# Patient Record
Sex: Male | Born: 1955
Health system: Southern US, Community
[De-identification: ages and names within clinical notes are randomized; demographics above are authoritative.]

## PROBLEM LIST (undated history)

## (undated) DIAGNOSIS — R911 Solitary pulmonary nodule: Secondary | ICD-10-CM

## (undated) DIAGNOSIS — N201 Calculus of ureter: Secondary | ICD-10-CM

## (undated) DIAGNOSIS — K219 Gastro-esophageal reflux disease without esophagitis: Secondary | ICD-10-CM

## (undated) DIAGNOSIS — G8929 Other chronic pain: Secondary | ICD-10-CM

## (undated) DIAGNOSIS — R109 Unspecified abdominal pain: Secondary | ICD-10-CM

## (undated) DIAGNOSIS — R011 Cardiac murmur, unspecified: Secondary | ICD-10-CM

## (undated) DIAGNOSIS — Z87442 Personal history of urinary calculi: Secondary | ICD-10-CM

## (undated) DIAGNOSIS — I10 Essential (primary) hypertension: Secondary | ICD-10-CM

## (undated) HISTORY — DX: Unspecified abdominal pain: R10.9

## (undated) HISTORY — DX: Other chronic pain: G89.29

## (undated) HISTORY — DX: Solitary pulmonary nodule: R91.1

## (undated) HISTORY — DX: Essential (primary) hypertension: I10

---

## 1998-12-20 HISTORY — PX: OTHER SURGICAL HISTORY: SHX169

## 2001-10-13 ENCOUNTER — Encounter: Payer: Self-pay | Admitting: *Deleted

## 2001-10-13 ENCOUNTER — Emergency Department (HOSPITAL_COMMUNITY): Admission: EM | Admit: 2001-10-13 | Discharge: 2001-10-13 | Payer: Self-pay | Admitting: *Deleted

## 2010-09-23 ENCOUNTER — Encounter (HOSPITAL_COMMUNITY)
Admission: RE | Admit: 2010-09-23 | Discharge: 2010-12-18 | Payer: Self-pay | Source: Home / Self Care | Attending: Urology | Admitting: Urology

## 2010-11-26 ENCOUNTER — Emergency Department (HOSPITAL_COMMUNITY): Admission: EM | Admit: 2010-11-26 | Discharge: 2010-06-22 | Payer: Self-pay | Admitting: Emergency Medicine

## 2011-03-07 LAB — DIFFERENTIAL
Blasts: 0 %
Metamyelocytes Relative: 0 %
Myelocytes: 0 %
Neutro Abs: 6.3 10*3/uL (ref 1.7–7.7)
Neutrophils Relative %: 68 % (ref 43–77)
Promyelocytes Absolute: 0 %
nRBC: 0 /100 WBC

## 2011-03-07 LAB — CBC
HCT: 37.2 % — ABNORMAL LOW (ref 39.0–52.0)
Hemoglobin: 12.5 g/dL — ABNORMAL LOW (ref 13.0–17.0)
MCH: 32.2 pg (ref 26.0–34.0)
MCHC: 33.7 g/dL (ref 30.0–36.0)
MCV: 95.6 fL (ref 78.0–100.0)
Platelets: 230 10*3/uL (ref 150–400)
RBC: 3.89 MIL/uL — ABNORMAL LOW (ref 4.22–5.81)
RDW: 14 % (ref 11.5–15.5)
WBC: 9.2 10*3/uL (ref 4.0–10.5)

## 2011-03-07 LAB — COMPREHENSIVE METABOLIC PANEL
ALT: 16 U/L (ref 0–53)
AST: 28 U/L (ref 0–37)
Albumin: 4.1 g/dL (ref 3.5–5.2)
Alkaline Phosphatase: 91 U/L (ref 39–117)
BUN: 18 mg/dL (ref 6–23)
CO2: 25 mEq/L (ref 19–32)
Calcium: 9.1 mg/dL (ref 8.4–10.5)
Chloride: 103 mEq/L (ref 96–112)
Creatinine, Ser: 1.53 mg/dL — ABNORMAL HIGH (ref 0.4–1.5)
GFR calc Af Amer: 58 mL/min — ABNORMAL LOW (ref >60)
GFR calc non Af Amer: 48 mL/min — ABNORMAL LOW (ref >60)
Glucose, Bld: 128 mg/dL — ABNORMAL HIGH (ref 70–99)
Potassium: 4.3 mEq/L (ref 3.5–5.1)
Sodium: 134 mEq/L — ABNORMAL LOW (ref 135–145)
Total Bilirubin: 1 mg/dL (ref 0.3–1.2)
Total Protein: 7.8 g/dL (ref 6.0–8.3)

## 2011-03-07 LAB — URINALYSIS, ROUTINE W REFLEX MICROSCOPIC
Bilirubin Urine: NEGATIVE
Glucose, UA: NEGATIVE mg/dL
Ketones, ur: NEGATIVE mg/dL
Leukocytes, UA: NEGATIVE
Nitrite: NEGATIVE
Protein, ur: NEGATIVE mg/dL
Specific Gravity, Urine: 1.025 (ref 1.005–1.030)
Urobilinogen, UA: 0.2 mg/dL (ref 0.0–1.0)
pH: 6 (ref 5.0–8.0)

## 2011-03-07 LAB — URINE CULTURE: Colony Count: 25000

## 2011-03-07 LAB — URINE MICROSCOPIC-ADD ON

## 2011-09-02 ENCOUNTER — Encounter: Payer: Self-pay | Admitting: Family Medicine

## 2011-09-02 ENCOUNTER — Ambulatory Visit (INDEPENDENT_AMBULATORY_CARE_PROVIDER_SITE_OTHER): Payer: 59 | Admitting: Family Medicine

## 2011-09-02 VITALS — BP 182/100 | HR 52 | Resp 16 | Ht 69.5 in | Wt 142.4 lb

## 2011-09-02 DIAGNOSIS — N200 Calculus of kidney: Secondary | ICD-10-CM

## 2011-09-02 DIAGNOSIS — R109 Unspecified abdominal pain: Secondary | ICD-10-CM

## 2011-09-02 DIAGNOSIS — I10 Essential (primary) hypertension: Secondary | ICD-10-CM

## 2011-09-02 DIAGNOSIS — G8929 Other chronic pain: Secondary | ICD-10-CM

## 2011-09-02 MED ORDER — AMLODIPINE BESYLATE 10 MG PO TABS: 10.0000 mg | ORAL_TABLET | Freq: Every day | ORAL | Status: DC

## 2011-09-02 NOTE — Patient Instructions (Addendum)
Please get your blood work  do not eat after midnight. Start the blood pressure medication Next visit on Monday to  f/u blood pressure

## 2011-09-02 NOTE — Assessment & Plan Note (Signed)
He'll continue to follow with Dr.Nessi for his kidney stones. Of note he thinks he is on a medication to control kidney stones however he does not have the name of this medication with him.

## 2011-09-02 NOTE — Assessment & Plan Note (Addendum)
Patient currently asymptomatic from elevated blood pressure. I will start him on Norvasc. I did discuss with the cardiologist regarding the absorption problem. He states we can still try pills first. If this does not work then move to clonidine patch. Baseline labs to be drawn

## 2011-09-02 NOTE — Assessment & Plan Note (Signed)
He is currently receiving that no patches from her surgeon although he does not follow with him on a yearly basis. I will obtain records regarding this.

## 2011-09-02 NOTE — Progress Notes (Signed)
  Subjective:    Patient ID: Johnathan Baldwin, male    DOB: September 11, 1956, 55 y.o.   MRN: [id number]  HPI  Pt here to establish care. No previous PCP   HTN- was at his urologist office and told his blood pressure was high,he had an appt last week and it was still very elevated, he has never been on any blood pressure medications.   Exercises on regular basis. Non smoker   Kidney Stones-- has recurrent kidney stones , recently had a UTI, he has completed antibiotics, he has also had prostate checked by urology    Colostomy- has colsotomy s/p abdominal injury at work on a machine, resulted in surgery and colostomy. He does not follow with any specialist for this at this time. On disability secondary to this.    Urologist- Dr. Julien Girt ( [facility] in [place]) Surgeon- Dr. Verl Bangs [facility] [place])- general surgeon, continues to prescribe the pain patch(Fentanyl q 3 days))   No recent blood work.   Review of Systems   GEN- denies fatigue, fever, weight loss,weakness, recent illness CVS- denies chest pain, palpitations RESP- denies SOB, cough, wheeze MSK- denies joint pain, muscle aches, injury Neuro- occ headache, denies dizziness, syncope, seizure activity      Objective:   Physical Exam GEN- NAD, alert and oriented x3, thin, pleasant HEENT- PERRL, EOMI,  MMM, oropharynx clear CVS- bradycardia (HR 50's), no murmur RESP-CTAB ABD- Soft, multiple scars, NT, ND, colostomy RLQ - pink stoma EXT- No edema Pulses- Radial, DP- 2+   EKG- Bradycardia-sinus, LVH     Assessment & Plan:

## 2011-09-06 ENCOUNTER — Ambulatory Visit (INDEPENDENT_AMBULATORY_CARE_PROVIDER_SITE_OTHER): Payer: 59 | Admitting: Family Medicine

## 2011-09-06 ENCOUNTER — Encounter: Payer: Self-pay | Admitting: Family Medicine

## 2011-09-06 VITALS — BP 124/84 | HR 59 | Resp 16 | Ht 69.5 in | Wt 142.0 lb

## 2011-09-06 DIAGNOSIS — I10 Essential (primary) hypertension: Secondary | ICD-10-CM

## 2011-09-06 MED ORDER — AMLODIPINE BESYLATE 10 MG PO TABS: 10.0000 mg | ORAL_TABLET | Freq: Every day | ORAL | Status: DC

## 2011-09-06 NOTE — Patient Instructions (Signed)
Continue your current blood pressure medication. Call if have any problems  We will call with lab results  Next visit in Feb 2013

## 2011-09-06 NOTE — Assessment & Plan Note (Addendum)
Patient blood pressure much improved with addition of Norvasc. His goal is less than 140/90. Will continue current meds. He will have basic lab work done and we will call with results. He does not appear to be having any absorption problems at this time.

## 2011-09-06 NOTE — Progress Notes (Signed)
  Subjective:    Patient ID: Johnathan Baldwin, male    DOB: 1956/05/06, 55 y.o.   MRN: [id number]  HPI Patient here to followup on his blood pressure. One week ago was started on Norvasc secondary to elevated blood pressure readings at urology office as well as our office. He denies any absorption problems with his pill. He has not seen the pill in his colostomy bag. He started taking the medication on Saturday.   ROS- Denies HA, CP, SOB, Leg swelling He has not had labs done yet  Review of Systems per above      Objective:   Physical Exam GEN- NAD, alert and oriented,pleasant CVS-Bradycardic, no murmur RESP-CTAB EXT- no edema       Assessment & Plan:

## 2011-09-10 ENCOUNTER — Encounter: Payer: Self-pay | Admitting: Family Medicine

## 2012-02-07 ENCOUNTER — Encounter: Payer: Self-pay | Admitting: Family Medicine

## 2012-02-07 ENCOUNTER — Ambulatory Visit (INDEPENDENT_AMBULATORY_CARE_PROVIDER_SITE_OTHER): Payer: 59 | Admitting: Family Medicine

## 2012-02-07 VITALS — BP 154/84 | HR 55 | Resp 16 | Ht 69.5 in | Wt 145.1 lb

## 2012-02-07 DIAGNOSIS — M222X9 Patellofemoral disorders, unspecified knee: Secondary | ICD-10-CM | POA: Insufficient documentation

## 2012-02-07 DIAGNOSIS — M778 Other enthesopathies, not elsewhere classified: Secondary | ICD-10-CM | POA: Insufficient documentation

## 2012-02-07 DIAGNOSIS — I10 Essential (primary) hypertension: Secondary | ICD-10-CM

## 2012-02-07 DIAGNOSIS — Z23 Encounter for immunization: Secondary | ICD-10-CM

## 2012-02-07 DIAGNOSIS — M65839 Other synovitis and tenosynovitis, unspecified forearm: Secondary | ICD-10-CM

## 2012-02-07 DIAGNOSIS — M25569 Pain in unspecified knee: Secondary | ICD-10-CM

## 2012-02-07 DIAGNOSIS — M65849 Other synovitis and tenosynovitis, unspecified hand: Secondary | ICD-10-CM

## 2012-02-07 MED ORDER — MELOXICAM 7.5 MG PO TABS: 7.5000 mg | ORAL_TABLET | Freq: Every day | ORAL | Status: DC

## 2012-02-07 MED ORDER — AMLODIPINE BESYLATE 10 MG PO TABS: 10.0000 mg | ORAL_TABLET | Freq: Every day | ORAL | Status: DC

## 2012-02-07 NOTE — Assessment & Plan Note (Signed)
Restart Norvasc. Patient will get labs done

## 2012-02-07 NOTE — Assessment & Plan Note (Signed)
I believe he has a tendinitis of his forearm from overuse with the weights. He will cut back on the amount of weight he is lifting. His biceps appears intact. And his strength is good in his forearm and arm. Anti-inflammatories prescribed per above

## 2012-02-07 NOTE — Assessment & Plan Note (Signed)
Anterior knee pain more consistent with patellofemoral syndrome. We'll start on anti-inflammatories. Exam concerning today. If no improvement will refer to ortho

## 2012-02-07 NOTE — Patient Instructions (Addendum)
For your knee pain- I think this Patellafemoral syndrome- use ice as needed after work-outs, Meloxicam is an anti-inflammatory, try this for 4 weeks, if not better please call and I will refer you to orthopedics For your arm- Meloxicam is the anti-inflammatory decrease your weights in the meantime Restart your blood pressure pill Today you have been given your TDAP (tetanus) Please get your lab work we will call with resulst F/U in 3 months for blood pressure

## 2012-02-07 NOTE — Progress Notes (Signed)
  Subjective:    Patient ID: Johnathan Baldwin, male    DOB: 01-22-1956, 56 y.o.   MRN: [id number]  HPI Hypertension-patient has been out of blood pressure medications for the past 5 days. He was on Norvasc 10 mg previously. He was tolerating without any problems  Knee pain-right knee pain for the past 3-4 months. He does not have any particular injury but works out a lot. He denies locking, catching, getting out of knee. He denies swelling of knee. Pain is located in the anterior knee it is more of an aching quality. He has not tried any over-the-counter medications.  Arm pain-patient noted pain in his left forearm after lifting weights. He typically lives 80 pounds with his bicep curls has been having discomfort for the past 4-5 months. He denies any snapping episode or bruising.   Review of Systems   GEN- denies fatigue, fever, weight loss,weakness, recent illness HEENT- denies eye drainage, change in vision, nasal discharge, CVS- denies chest pain, palpitations RESP- denies SOB, cough, wheeze ABD- denies N/V, change in stools, abd pain GU- denies dysuria, hematuria, dribbling, incontinence MSK- + joint pain, muscle aches, injury Neuro- denies headache, dizziness, syncope, seizure activity       Objective:   Physical Exam GEN- NAD, alert and oriented x3 HEENT- PERRL, EOMI, non injected sclera, pink conjunctiva, MMM, oropharynx clear Neck- Supple,  CVS- RRR, no murmur RESP-CTAB EXT- No edema Pulses- Radial, DP- 2+ Shoulder- Rotator cuff in tact, biceps tendon in tact. TTP 1cm  Below the anticubital fossa  Knee pain- Right knee normal ROM, no effusion, neg lachmans, neg drawer,patella tracks normally, good strength lower ext, mild pain palpation anterior knee at inferior edge of patella       Assessment & Plan:

## 2012-02-08 LAB — COMPREHENSIVE METABOLIC PANEL
Albumin: 4.3 g/dL (ref 3.5–5.2)
Alkaline Phosphatase: 97 U/L (ref 39–117)
CO2: 27 mEq/L (ref 19–32)
Calcium: 9.2 mg/dL (ref 8.4–10.5)
Chloride: 104 mEq/L (ref 96–112)
Glucose, Bld: 98 mg/dL (ref 70–99)
Potassium: 4.2 mEq/L (ref 3.5–5.3)
Sodium: 140 mEq/L (ref 135–145)
Total Protein: 7.3 g/dL (ref 6.0–8.3)

## 2012-02-08 LAB — CBC
MCH: 30.7 pg (ref 26.0–34.0)
Platelets: 303 10*3/uL (ref 150–400)
RBC: 4.01 MIL/uL — ABNORMAL LOW (ref 4.22–5.81)

## 2012-02-08 LAB — LIPID PANEL: Total CHOL/HDL Ratio: 2 Ratio

## 2012-03-07 ENCOUNTER — Ambulatory Visit (INDEPENDENT_AMBULATORY_CARE_PROVIDER_SITE_OTHER): Payer: 59 | Admitting: Family Medicine

## 2012-03-07 ENCOUNTER — Encounter: Payer: Self-pay | Admitting: Family Medicine

## 2012-03-07 VITALS — BP 120/80 | HR 73 | Resp 16 | Ht 69.5 in | Wt 150.8 lb

## 2012-03-07 DIAGNOSIS — M25569 Pain in unspecified knee: Secondary | ICD-10-CM

## 2012-03-07 DIAGNOSIS — I10 Essential (primary) hypertension: Secondary | ICD-10-CM

## 2012-03-07 DIAGNOSIS — M222X9 Patellofemoral disorders, unspecified knee: Secondary | ICD-10-CM

## 2012-03-07 MED ORDER — AMLODIPINE BESYLATE 10 MG PO TABS: 10.0000 mg | ORAL_TABLET | Freq: Every day | ORAL | Status: DC

## 2012-03-07 MED ORDER — POTASSIUM CITRATE ER 10 MEQ (1080 MG) PO TBCR: 10.0000 meq | EXTENDED_RELEASE_TABLET | Freq: Two times a day (BID) | ORAL | Status: DC

## 2012-03-07 NOTE — Patient Instructions (Addendum)
Continue your current medications  Call your gastroenterologist at [facility] to see when your colonoscopy is due Call [facility] to see when you need to be seen again- [phone] F/U 3 months

## 2012-03-07 NOTE — Assessment & Plan Note (Signed)
BP controlled on meds, refills given, f/u 3 months

## 2012-03-07 NOTE — Assessment & Plan Note (Signed)
Improved

## 2012-03-07 NOTE — Progress Notes (Signed)
  Subjective:    Patient ID: Johnathan Baldwin, male    DOB: Apr 03, 1956, 56 y.o.   MRN: [id number]  HPI Pt here for blood pressure f/u, he was suppose to come back in 3 months but was confused and given appt for 3 weeks. Doing well, taking BP meds, no concerns Shoulder and knee pain has improved significantly taking NSAIDS as needed   Review of Systems - per above  GEN- no recent illness  CVS- No CP EXT- No edema     Objective:   Physical Exam  GEN- NAD, alert and oriented x 3       Assessment & Plan:

## 2012-03-20 DIAGNOSIS — R911 Solitary pulmonary nodule: Secondary | ICD-10-CM

## 2012-03-20 HISTORY — DX: Solitary pulmonary nodule: R91.1

## 2012-04-01 ENCOUNTER — Encounter (HOSPITAL_COMMUNITY): Payer: Self-pay | Admitting: *Deleted

## 2012-04-01 ENCOUNTER — Emergency Department (HOSPITAL_COMMUNITY)
Admission: EM | Admit: 2012-04-01 | Discharge: 2012-04-02 | Disposition: A | Discharge status: Home / Self Care | Payer: 59 | Attending: Emergency Medicine | Admitting: Emergency Medicine

## 2012-04-01 ENCOUNTER — Other Ambulatory Visit: Payer: Self-pay

## 2012-04-01 ENCOUNTER — Emergency Department (HOSPITAL_COMMUNITY): Payer: 59

## 2012-04-01 DIAGNOSIS — R079 Chest pain, unspecified: Secondary | ICD-10-CM | POA: Insufficient documentation

## 2012-04-01 DIAGNOSIS — I10 Essential (primary) hypertension: Secondary | ICD-10-CM | POA: Insufficient documentation

## 2012-04-01 DIAGNOSIS — R51 Headache: Secondary | ICD-10-CM | POA: Insufficient documentation

## 2012-04-01 DIAGNOSIS — R42 Dizziness and giddiness: Secondary | ICD-10-CM | POA: Insufficient documentation

## 2012-04-01 LAB — BASIC METABOLIC PANEL
BUN: 12 mg/dL (ref 6–23)
CO2: 26 mEq/L (ref 19–32)
Calcium: 9.1 mg/dL (ref 8.4–10.5)
Creatinine, Ser: 1.3 mg/dL (ref 0.50–1.35)
GFR calc non Af Amer: 61 mL/min — ABNORMAL LOW (ref >90)
Glucose, Bld: 113 mg/dL — ABNORMAL HIGH (ref 70–99)
Potassium: 3.7 mEq/L (ref 3.5–5.1)

## 2012-04-01 LAB — CBC
HCT: 34.5 % — ABNORMAL LOW (ref 39.0–52.0)
Platelets: 276 10*3/uL (ref 150–400)
RDW: 14 % (ref 11.5–15.5)
WBC: 11.6 10*3/uL — ABNORMAL HIGH (ref 4.0–10.5)

## 2012-04-01 LAB — D-DIMER, QUANTITATIVE: D-Dimer, Quant: 0.74 ug/mL-FEU — ABNORMAL HIGH (ref 0.00–0.48)

## 2012-04-01 MED ORDER — ASPIRIN 81 MG PO CHEW
CHEWABLE_TABLET | ORAL | Status: AC
  Filled 2012-04-01: qty 1

## 2012-04-01 MED ORDER — ASPIRIN 81 MG PO CHEW
324.0000 mg | CHEWABLE_TABLET | Freq: Once | ORAL | Status: AC
  Administered 2012-04-01: 324 mg via ORAL
  Filled 2012-04-01: qty 3

## 2012-04-01 MED ORDER — SODIUM CHLORIDE 0.9 % IV SOLN: INTRAVENOUS | Status: DC

## 2012-04-01 MED ORDER — SODIUM CHLORIDE 0.9 % IV BOLUS (SEPSIS)
250.0000 mL | Freq: Once | INTRAVENOUS | Status: AC
  Administered 2012-04-01: 250 mL via INTRAVENOUS

## 2012-04-01 NOTE — ED Provider Notes (Addendum)
History   This chart was scribed for Shelda Jakes, MD by Sofie Rower. The patient was seen in room APA05/APA05 and the patient's care was started at 9:45 PM     CSN: [id number]  Arrival date & time 04/01/12  2056   First MD Initiated Contact with Patient 04/01/12 2114      Chief Complaint  Patient presents with  . Chest Pain  . Dizziness  . Headache    (Consider location/radiation/quality/duration/timing/severity/associated sxs/prior treatment) HPI  Johnathan Baldwin is a 56 y.o. male who presents to the Emergency Department complaining of moderate, intermittent chest pain located on the left side onset today (6:30PM) with associated symptoms of dizziness (, headache, shortness of breath, neck pain, rash on the neck (onset 1 month ago). The pt states "the chest pain last for a few seconds." The pt informs the EDP that the "chest pain is a short sharp pain, followed by a moderate nagging pain." "the longest the pain has been at one time is 1 minute, separated by 15 minute intervals of no pain." Pt also complains of moderate, constant left flank pain onset four days ago. Pt has a hx of kidney stones  Pt denies chest pain like this before, nausea, vomiting, dysuria, fever, back pain, hematuria, swelling in legs, bleeding problems, chest pain at present time.   PCP is Dr. Jeanice Lim.   Past Medical History  Diagnosis Date  . Hypertension   . Acid reflux   . Kidney stones   . Chronic abdominal pain     Past Surgical History  Procedure Date  . Colostomy     Family History  Problem Relation Age of Onset  . Hypertension Mother   . Hypertension Brother   . Hypertension Sister   . Hypertension Brother     History  Substance Use Topics  . Smoking status: Never Smoker   . Smokeless tobacco: Not on file  . Alcohol Use: No      Review of Systems  All other systems reviewed and are negative.    10 Systems reviewed and all are negative for acute change except as noted in  the HPI.    Allergies  Review of patient's allergies indicates no known allergies.  Home Medications   Current Outpatient Rx  Name Route Sig Dispense Refill  . AMLODIPINE BESYLATE 10 MG PO TABS Oral Take 1 tablet (10 mg total) by mouth daily. 30 tablet 3  . FENTANYL 25 MCG/HR TD PT72      . MELOXICAM 7.5 MG PO TABS Oral Take 1 tablet (7.5 mg total) by mouth daily. 30 tablet 1  . POTASSIUM CITRATE ER 10 MEQ (1080 MG) PO TBCR Oral Take 1 tablet (10 mEq total) by mouth 2 (two) times daily with a meal. 60 tablet 3  . RANITIDINE HCL 15 MG/ML PO SYRP Oral Take 5 mg by mouth 2 (two) times daily.     Marland Kitchen HYDROCODONE-ACETAMINOPHEN 5-325 MG PO TABS Oral Take 1-2 tablets by mouth every 6 (six) hours as needed for pain. 10 tablet 0    BP 154/88  Pulse 76  Temp(Src) 102.5 F (39.2 C) (Oral)  Resp 20  SpO2 99%  Physical Exam  Nursing note and vitals reviewed. Constitutional: He is oriented to person, place, and time. He appears well-developed and well-nourished.  HENT:  Right Ear: External ear normal.  Left Ear: External ear normal.  Nose: Nose normal.  Eyes: Conjunctivae and EOM are normal.  Neck: Normal range of motion. Neck  supple.  Cardiovascular: Normal rate, regular rhythm and normal heart sounds.  Exam reveals no gallop and no friction rub.   No murmur heard. Pulmonary/Chest: Effort normal and breath sounds normal. He has no wheezes. He has no rales.  Abdominal: Bowel sounds are normal. There is no tenderness.  Musculoskeletal: Normal range of motion. He exhibits no edema.  Neurological: He is alert and oriented to person, place, and time. No cranial nerve deficit.  Skin: Skin is warm and dry. Rash noted.  Psychiatric: He has a normal mood and affect. His behavior is normal.    ED Course  Procedures (including critical care time)  DIAGNOSTIC STUDIES: Oxygen Saturation is 99% on room air, normal by my interpretation.    COORDINATION OF CARE:   Results for orders placed  during the hospital encounter of 04/01/12  CBC      Component Value Range   WBC 11.6 (*) 4.0 - 10.5 (K/uL)   RBC 3.67 (*) 4.22 - 5.81 (MIL/uL)   Hemoglobin 11.6 (*) 13.0 - 17.0 (g/dL)   HCT 04.5 (*) 40.9 - 52.0 (%)   MCV 94.0  78.0 - 100.0 (fL)   MCH 31.6  26.0 - 34.0 (pg)   MCHC 33.6  30.0 - 36.0 (g/dL)   RDW 81.1  91.4 - 78.2 (%)   Platelets 276  150 - 400 (K/uL)  BASIC METABOLIC PANEL      Component Value Range   Sodium 133 (*) 135 - 145 (mEq/L)   Potassium 3.7  3.5 - 5.1 (mEq/L)   Chloride 98  96 - 112 (mEq/L)   CO2 26  19 - 32 (mEq/L)   Glucose, Bld 113 (*) 70 - 99 (mg/dL)   BUN 12  6 - 23 (mg/dL)   Creatinine, Ser 9.56  0.50 - 1.35 (mg/dL)   Calcium 9.1  8.4 - 21.3 (mg/dL)   GFR calc non Af Amer 61 (*) >90 (mL/min)   GFR calc Af Amer 71 (*) >90 (mL/min)  TROPONIN I      Component Value Range   Troponin I <0.30  <0.30 (ng/mL)  D-DIMER, QUANTITATIVE      Component Value Range   D-Dimer, Quant 0.74 (*) 0.00 - 0.48 (ug/mL-FEU)  TROPONIN I      Component Value Range   Troponin I <0.30  <0.30 (ng/mL)   No results found.    Date: 04/02/2012  Rate: 75  Rhythm: normal sinus rhythm  QRS Axis: normal  Intervals: normal  ST/T Wave abnormalities: nonspecific ST/T changes  Conduction Disutrbances:right bundle branch block  Narrative Interpretation:   Old EKG Reviewed: none available Right bundle is incomplete. No EKG for comparison   Dg Chest 2 View  04/02/2012  *RADIOLOGY REPORT*  Clinical Data: Chest pain, shortness of breath, dizziness  CHEST - 2 VIEW  Comparison: None.  Findings: Lungs are essentially clear.  Mild scarring at the left lung base.  No pleural effusion or pneumothorax.  Cardiomediastinal silhouette is within normal limits.  IMPRESSION: No evidence of acute cardiopulmonary disease.  Original Report Authenticated By: Charline Bills, M.D.   Ct Angio Chest W/cm &/or Wo Cm  04/02/2012  *RADIOLOGY REPORT*  Clinical Data: Dizziness, elevated D-dimer, evaluate  for PE  CT ANGIOGRAPHY CHEST  Technique:  Multidetector CT imaging of the chest using the standard protocol during bolus administration of intravenous contrast. Multiplanar reconstructed images including MIPs were obtained and reviewed to evaluate the vascular anatomy.  Contrast: OMNIPAQUE IOHEXOL 350 MG/ML SOLN  Comparison: Chest radiographs dated  04/01/2012  Findings: No evidence of pulmonary embolism.  Biapical pleural parenchymal scarring.  Scarring at the bilateral lung bases.  Mild subpleural reticulation anteriorly with associated subpleural patchy opacity (series 5/image 37). 7 x 5 mm right middle lobe nodule (series 5/image 42). No pleural effusion or pneumothorax.  Visualized thyroid is unremarkable.  The heart is normal in size.  No pericardial effusion.  No suspicious mediastinal, hilar, or axillary lymphadenopathy.  Visualized upper abdomen is notable for prior gastric surgery and postsurgical changes in the anterior abdominal wall.  Mild degenerative changes of the visualized thoracolumbar spine.  IMPRESSION: No evidence of pulmonary embolism.  7 x 5 mm right middle lobe nodule. Follow-up CT chest is suggested in 12 months (if low risk for bronchogenic carcinoma) or 6-12 months (if high risk).  This recommendation follows the consensus statement: Guidelines for Management of Small Pulmonary Nodules Detected on CT Scans:  A Statement from the Fleischner Society as published in Radiology 2005; 237:395-400.  Original Report Authenticated By: Charline Bills, M.D.     1. Chest pain      9:50PM- EDP at bedside discusses treatment plan.   MDM   Patient presents with onset of intermittent chest pain at 6:00 this evening. Chest pain duration less than 5 minutes with each episode. EKG without any acute findings initial troponin is negative however d-dimer was elevated at 0.74 chest x-Aloys without any specific findings of pneumonia or pneumothorax. Patient will need CT angiogram rule out the  pulmonary embolism. We'll also repeat troponin which appears to troponin beyond the six-hour onset of the chest pain if both of these are negative patient can be discharged home if positive for PE will require admission.  Patient was given aspirin for leg after arrival just in case in the end up being an acute cardiac event.  Repeat and 6 hour troponin is negative no evidence of acute cardiac event.  CT scan negative for pulmonary embolism. Right middle lobe nodule noted have patient followup with his primary care doctor for further evaluation of this in the future. Patient will be notified of this.   I personally performed the services described in this documentation, which was scribed in my presence. The recorded information has been reviewed and considered.     Shelda Jakes, MD 04/02/12 Lyda Jester  Shelda Jakes, MD 04/02/12 0230

## 2012-04-01 NOTE — ED Notes (Signed)
Pt c/o intermittent chest pain x 2hrs. Pt states pain is sharp, also c/o dizziness and headache.

## 2012-04-02 ENCOUNTER — Emergency Department (HOSPITAL_COMMUNITY): Payer: 59

## 2012-04-02 MED ORDER — HYDROCODONE-ACETAMINOPHEN 5-325 MG PO TABS: 1.0000 | ORAL_TABLET | Freq: Four times a day (QID) | ORAL | Status: AC | PRN

## 2012-04-02 MED ORDER — IOHEXOL 350 MG/ML SOLN
100.0000 mL | Freq: Once | INTRAVENOUS | Status: AC | PRN
  Administered 2012-04-02: 100 mL via INTRAVENOUS

## 2012-04-02 MED ORDER — IOHEXOL 350 MG/ML SOLN: 100.0000 mL | Freq: Once | INTRAVENOUS | Status: DC | PRN

## 2012-04-02 NOTE — Discharge Instructions (Signed)
followup with Dr. Jeanice Lim sometime next week. Recommend starting a baby aspirin a day. Return for any new or worse symptoms or chest pain lasting 15 minutes or longer. Hydrocodone provided as needed for pain.

## 2012-04-03 ENCOUNTER — Encounter: Payer: Self-pay | Admitting: Family Medicine

## 2012-04-10 ENCOUNTER — Encounter (HOSPITAL_COMMUNITY): Payer: Self-pay | Admitting: *Deleted

## 2012-04-10 ENCOUNTER — Encounter: Payer: Self-pay | Admitting: Family Medicine

## 2012-04-10 ENCOUNTER — Ambulatory Visit (INDEPENDENT_AMBULATORY_CARE_PROVIDER_SITE_OTHER): Payer: 59 | Admitting: Family Medicine

## 2012-04-10 ENCOUNTER — Emergency Department (HOSPITAL_COMMUNITY): Payer: 59

## 2012-04-10 ENCOUNTER — Emergency Department (HOSPITAL_COMMUNITY)
Admission: EM | Admit: 2012-04-10 | Discharge: 2012-04-11 | Disposition: A | Discharge status: Home / Self Care | Payer: 59 | Attending: Emergency Medicine | Admitting: Emergency Medicine

## 2012-04-10 VITALS — BP 132/78 | HR 68 | Temp 98.1°F | Resp 18 | Ht 69.5 in | Wt 149.0 lb

## 2012-04-10 DIAGNOSIS — R109 Unspecified abdominal pain: Secondary | ICD-10-CM | POA: Insufficient documentation

## 2012-04-10 DIAGNOSIS — R319 Hematuria, unspecified: Secondary | ICD-10-CM

## 2012-04-10 DIAGNOSIS — N200 Calculus of kidney: Secondary | ICD-10-CM | POA: Insufficient documentation

## 2012-04-10 DIAGNOSIS — K219 Gastro-esophageal reflux disease without esophagitis: Secondary | ICD-10-CM | POA: Insufficient documentation

## 2012-04-10 DIAGNOSIS — I10 Essential (primary) hypertension: Secondary | ICD-10-CM | POA: Insufficient documentation

## 2012-04-10 DIAGNOSIS — Z87442 Personal history of urinary calculi: Secondary | ICD-10-CM | POA: Insufficient documentation

## 2012-04-10 LAB — URINALYSIS, ROUTINE W REFLEX MICROSCOPIC
Glucose, UA: NEGATIVE mg/dL
Ketones, ur: NEGATIVE mg/dL
Leukocytes, UA: NEGATIVE
Nitrite: NEGATIVE
Specific Gravity, Urine: 1.01 (ref 1.005–1.030)
pH: 6.5 (ref 5.0–8.0)

## 2012-04-10 LAB — POCT URINALYSIS DIPSTICK
Bilirubin, UA: NEGATIVE
Glucose, UA: NEGATIVE
Nitrite, UA: NEGATIVE

## 2012-04-10 LAB — URINE MICROSCOPIC-ADD ON

## 2012-04-10 MED ORDER — TAMSULOSIN HCL 0.4 MG PO CAPS: 0.4000 mg | ORAL_CAPSULE | Freq: Every day | ORAL | Status: DC

## 2012-04-10 MED ORDER — HYDROMORPHONE HCL PF 1 MG/ML IJ SOLN
1.0000 mg | Freq: Once | INTRAMUSCULAR | Status: AC
  Administered 2012-04-10: 1 mg via INTRAVENOUS
  Filled 2012-04-10: qty 1

## 2012-04-10 MED ORDER — CIPROFLOXACIN HCL 500 MG PO TABS: 500.0000 mg | ORAL_TABLET | Freq: Two times a day (BID) | ORAL | Status: AC

## 2012-04-10 MED ORDER — MELOXICAM 7.5 MG PO TABS: 7.5000 mg | ORAL_TABLET | Freq: Every day | ORAL | Status: DC

## 2012-04-10 MED ORDER — SODIUM CHLORIDE 0.9 % IV SOLN
Freq: Once | INTRAVENOUS | Status: AC
  Administered 2012-04-10: via INTRAVENOUS

## 2012-04-10 MED ORDER — OXYCODONE-ACETAMINOPHEN 5-325 MG PO TABS: 1.0000 | ORAL_TABLET | ORAL | Status: AC | PRN

## 2012-04-10 MED ORDER — ONDANSETRON HCL 4 MG/2ML IJ SOLN
4.0000 mg | Freq: Once | INTRAMUSCULAR | Status: AC
  Administered 2012-04-10: 4 mg via INTRAVENOUS
  Filled 2012-04-10: qty 2

## 2012-04-10 NOTE — Assessment & Plan Note (Signed)
Flank pain concerning with UA showing moderate hematuria for recurrent stone. Will obtain CT abdomen/Pelvis in the AM. He does have small leukocytes therefore will cover with Cipro for UTI and possible upper tract involvement, culture sent Given percocet for break-through pain Flomax

## 2012-04-10 NOTE — Assessment & Plan Note (Signed)
Per above, pain more consistent with kidney stone, abdominal exam besides flank, benign, nontoxic appearing

## 2012-04-10 NOTE — ED Provider Notes (Signed)
History   This chart was scribed for Benny Lennert, MD by Clarita Crane. The patient was seen in room APA15/APA15. Patient's care was started at 1904.    CSN: [id number]  Arrival date & time 04/10/12  1904   First MD Initiated Contact with Patient 04/10/12 2308      Chief Complaint  Patient presents with  . Flank Pain    (Consider location/radiation/quality/duration/timing/severity/associated sxs/prior treatment) HPI Johnathan Baldwin is a 56 y.o. male who presents to the Emergency Department complaining of intermittent moderate to severe left flank pain onset 1 week ago and persistent since. PAtient describes pain as similar to pain previously experienced with kidney stones. States pain was relieved with use of Oxycodone. Denies fever, chills, nausea, vomiting. Patient with h/o HTN, kidney stones, chronic abdominal pain.   Past Medical History  Diagnosis Date  . Hypertension   . Acid reflux   . Kidney stones   . Chronic abdominal pain   . Lung nodule 03/2012    CT scan    Past Surgical History  Procedure Date  . Colostomy     Family History  Problem Relation Age of Onset  . Hypertension Mother   . Hypertension Brother   . Hypertension Sister   . Hypertension Brother     History  Substance Use Topics  . Smoking status: Never Smoker   . Smokeless tobacco: Not on file  . Alcohol Use: No      Review of Systems  Constitutional: Negative for fatigue.  HENT: Negative for congestion, sinus pressure and ear discharge.   Eyes: Negative for discharge.  Respiratory: Negative for cough.   Cardiovascular: Negative for chest pain.  Gastrointestinal: Negative for abdominal pain and diarrhea.  Genitourinary: Positive for flank pain. Negative for frequency and hematuria.  Musculoskeletal: Negative for back pain.  Skin: Negative for rash.  Neurological: Negative for seizures and headaches.  Hematological: Negative.   Psychiatric/Behavioral: Negative for hallucinations.     Allergies  Review of patient's allergies indicates no known allergies.  Home Medications   Current Outpatient Rx  Name Route Sig Dispense Refill  . AMLODIPINE BESYLATE 10 MG PO TABS Oral Take 1 tablet (10 mg total) by mouth daily. 30 tablet 3  . CIPROFLOXACIN HCL 500 MG PO TABS Oral Take 1 tablet (500 mg total) by mouth 2 (two) times daily. 20 tablet 0  . FENTANYL 25 MCG/HR TD PT72 Transdermal Place 1 patch onto the skin every 3 (three) days.     Marland Kitchen HYDROCODONE-ACETAMINOPHEN 5-325 MG PO TABS Oral Take 1-2 tablets by mouth every 6 (six) hours as needed for pain. 10 tablet 0  . MELOXICAM 7.5 MG PO TABS Oral Take 1 tablet (7.5 mg total) by mouth daily. 30 tablet 1  . OXYCODONE-ACETAMINOPHEN 5-325 MG PO TABS Oral Take 1 tablet by mouth every 4 (four) hours as needed for pain. 30 tablet 0  . POTASSIUM CITRATE ER 10 MEQ (1080 MG) PO TBCR Oral Take 1 tablet (10 mEq total) by mouth 2 (two) times daily with a meal. 60 tablet 3  . RANITIDINE HCL 15 MG/ML PO SYRP Oral Take 5 mg by mouth 2 (two) times daily.     Marland Kitchen TAMSULOSIN HCL 0.4 MG PO CAPS Oral Take 1 capsule (0.4 mg total) by mouth daily. 30 capsule 0    BP 141/85  Pulse 62  Temp(Src) 98.5 F (36.9 C) (Oral)  Resp 16  Ht 5' 9.5" (1.765 m)  Wt 149 lb (67.586 kg)  BMI  21.69 kg/m2  SpO2 98%  Physical Exam  Nursing note and vitals reviewed. Constitutional: He is oriented to person, place, and time. He appears well-developed and well-nourished. No distress.  HENT:  Head: Normocephalic and atraumatic.  Eyes: EOM are normal. Pupils are equal, round, and reactive to light.  Neck: Neck supple. No tracheal deviation present.  Cardiovascular: Normal rate and regular rhythm.  Exam reveals no gallop and no friction rub.   Murmur (2/6 systolic) heard. Pulmonary/Chest: Effort normal. No respiratory distress. He has no wheezes. He has no rales.  Abdominal: Soft. Bowel sounds are normal. He exhibits no distension. There is tenderness (left flank).   Musculoskeletal: Normal range of motion. He exhibits no edema.  Neurological: He is alert and oriented to person, place, and time. No sensory deficit.  Skin: Skin is warm and dry.  Psychiatric: He has a normal mood and affect. His behavior is normal.    ED Course  Procedures (including critical care time)  DIAGNOSTIC STUDIES: Oxygen Saturation is 98% on room air, normal by my interpretation.    COORDINATION OF CARE: 11:10PM- Patient informed of current plan for treatment and evaluation and agrees with plan at this time.      Labs Reviewed  CBC  DIFFERENTIAL  COMPREHENSIVE METABOLIC PANEL  URINALYSIS, ROUTINE W REFLEX MICROSCOPIC   No results found.   No diagnosis found.    MDM  Kidney stone      The chart was scribed for me under my direct supervision.  I personally performed the history, physical, and medical decision making and all procedures in the evaluation of this patient.Benny Lennert, MD 04/11/12 [phone]

## 2012-04-10 NOTE — ED Notes (Signed)
Patient admitted he was seen by PCP today and given prescriptions for pain and antibiotic. States "I only took one dose and it did not help so I came up here." Family at bedside.

## 2012-04-10 NOTE — ED Notes (Signed)
"  Pain from a kidney stone". Lt flank, no n/v.

## 2012-04-10 NOTE — Patient Instructions (Signed)
I am treating for kidney stone Take the antibiotics as prescribed for urine infection Flomax to help with the stone Percocet for break-through pain  I will call with results of the CT Scan  Keep previous f/u appt

## 2012-04-10 NOTE — Progress Notes (Addendum)
  Subjective:    Patient ID: Johnathan Baldwin, male    DOB: 1956-08-11, 56 y.o.   MRN: [id number]  HPI   Flank pain left side x 6 days, non radiating, history of previous pain with kidney stones. No change in output from colostomy, denies dysuria. No recent injury, no CP, no fever.   Review of Systems   GEN- denies fatigue, fever, weight loss,weakness, recent illness HEENT- denies eye drainage, change in vision, nasal discharge, CVS- denies chest pain, palpitations RESP- denies SOB, cough, wheeze ABD- denies N/V, change in stools, +abd pain GU- denies dysuria, hematuria, dribbling, incontinence MSK- denies joint pain, muscle aches, injury Neuro- denies headache, dizziness, syncope, seizure activity      Objective:   Physical Exam  GEN- NAD, alert and oriented x3, thin, pleasant HEENT- PERRL, EOMI,  MMM, oropharynx clear CVS- RRR, no murmur RESP-CTAB ABD- Soft, multiple scars, TTP left flank, no rebound no guarding, ND, colostomy RLQ - pink stoma Back- neg SLR Hip- normal IR/ER at hip joint  EXT- No edema Pulses- Radial, DP- 2+      Assessment & Plan:

## 2012-04-11 ENCOUNTER — Telehealth: Payer: Self-pay | Admitting: Family Medicine

## 2012-04-11 DIAGNOSIS — N200 Calculus of kidney: Secondary | ICD-10-CM

## 2012-04-11 LAB — DIFFERENTIAL
Basophils Relative: 0 % (ref 0–1)
Eosinophils Absolute: 0.1 10*3/uL (ref 0.0–0.7)
Eosinophils Relative: 1 % (ref 0–5)
Neutrophils Relative %: 83 % — ABNORMAL HIGH (ref 43–77)

## 2012-04-11 LAB — COMPREHENSIVE METABOLIC PANEL
Albumin: 3.8 g/dL (ref 3.5–5.2)
Alkaline Phosphatase: 126 U/L — ABNORMAL HIGH (ref 39–117)
BUN: 19 mg/dL (ref 6–23)
Calcium: 9.6 mg/dL (ref 8.4–10.5)
Potassium: 4.4 mEq/L (ref 3.5–5.1)
Sodium: 136 mEq/L (ref 135–145)
Total Protein: 8.1 g/dL (ref 6.0–8.3)

## 2012-04-11 LAB — CBC
MCH: 30.6 pg (ref 26.0–34.0)
MCHC: 32.3 g/dL (ref 30.0–36.0)
Platelets: 545 10*3/uL — ABNORMAL HIGH (ref 150–400)

## 2012-04-11 MED ORDER — OXYCODONE-ACETAMINOPHEN 5-325 MG PO TABS: 1.0000 | ORAL_TABLET | Freq: Four times a day (QID) | ORAL | Status: AC | PRN

## 2012-04-11 MED ORDER — CIPROFLOXACIN HCL 500 MG PO TABS: 500.0000 mg | ORAL_TABLET | Freq: Two times a day (BID) | ORAL | Status: AC

## 2012-04-11 MED ORDER — TAMSULOSIN HCL 0.4 MG PO CAPS: 0.4000 mg | ORAL_CAPSULE | Freq: Every day | ORAL | Status: DC

## 2012-04-11 MED ORDER — PROMETHAZINE HCL 25 MG PO TABS: 25.0000 mg | ORAL_TABLET | Freq: Four times a day (QID) | ORAL | Status: DC | PRN

## 2012-04-11 NOTE — Discharge Instructions (Signed)
Follow-up with your urologist this week. °

## 2012-04-11 NOTE — ED Notes (Signed)
Upon discharge, patient advised that he had medications filled that he was prescribed from his PCP today. States he already had the percocet, cipro, and flomax filled today. Advised MD. MD verbally ordered to shred these prescriptions. Pulled prescriptions from discharge papers and shredded. Gave patient prescription for Phenergan.

## 2012-04-11 NOTE — Progress Notes (Signed)
Addended by: Milinda Antis F on: 04/11/2012 08:18 AM   Modules accepted: Orders

## 2012-04-11 NOTE — Telephone Encounter (Signed)
I spoke with pt, seen in ED, left stone as predicted. I advised him not to get both sets of prescriptions as they are the same medications. We will make a referral to urology for him.

## 2012-04-12 LAB — URINE CULTURE

## 2012-06-06 ENCOUNTER — Encounter: Payer: Self-pay | Admitting: Family Medicine

## 2012-06-06 ENCOUNTER — Ambulatory Visit (INDEPENDENT_AMBULATORY_CARE_PROVIDER_SITE_OTHER): Payer: 59 | Admitting: Family Medicine

## 2012-06-06 VITALS — BP 134/80 | HR 52 | Resp 18 | Ht 69.5 in | Wt 147.0 lb

## 2012-06-06 DIAGNOSIS — N200 Calculus of kidney: Secondary | ICD-10-CM

## 2012-06-06 DIAGNOSIS — G8929 Other chronic pain: Secondary | ICD-10-CM

## 2012-06-06 DIAGNOSIS — I10 Essential (primary) hypertension: Secondary | ICD-10-CM

## 2012-06-06 DIAGNOSIS — R109 Unspecified abdominal pain: Secondary | ICD-10-CM

## 2012-06-06 DIAGNOSIS — N179 Acute kidney failure, unspecified: Secondary | ICD-10-CM

## 2012-06-06 NOTE — Progress Notes (Signed)
  Subjective:    Patient ID: Johnathan Baldwin, male    DOB: 1956/07/27, 56 y.o.   MRN: [id number]  HPI Patient here to follow chronic medical problems. He was seen in the emergency room in April of 2013 for left kidney stone. He is being followed by urology. Was noted that he had a mildly elevated creatinine of 1.74. He is tolerating his blood pressure medications he has no concerns today.    Review of Systems    GEN- denies fatigue, fever, weight loss,weakness, recent illness HEENT- denies eye drainage, change in vision, nasal discharge, CVS- denies chest pain, palpitations RESP- denies SOB, cough, wheeze ABD- denies N/V, change in stools, abd pain GU- denies dysuria, hematuria, dribbling, incontinence MSK- denies joint pain, muscle aches, injury Neuro- denies headache, dizziness, syncope, seizure activity       Objective:   Physical Exam GEN- NAD, alert and oriented x3, thin, pleasant HEENT- PERRL, EOMI,  MMM, oropharynx clear, small pupils unable to see optic disc CVS- RRR, no murmur RESP-CTAB EXT- No edema Pulses- Radial, DP- 2+ Psych-normal affect and Mood      Assessment & Plan:

## 2012-06-06 NOTE — Assessment & Plan Note (Signed)
Well controlled, no change to meds 

## 2012-06-06 NOTE — Assessment & Plan Note (Signed)
Recurrent stones, followed by urology, he has f/u next week, They are considering lithotripsy

## 2012-06-06 NOTE — Assessment & Plan Note (Signed)
Currently without pain, he continues on fentanyl patches

## 2012-06-06 NOTE — Assessment & Plan Note (Signed)
ARF noted on labs from ER visit in setting of stone. Will repeat BMET today

## 2012-06-06 NOTE — Patient Instructions (Signed)
Get the labs done, we will call with results and fax a copy to [facility] Call me with the number for [facility] about your colonoscopy Schedule your eye doctor visit  F/U 4 months

## 2012-06-19 HISTORY — PX: URETEROSCOPY: SHX842

## 2012-06-29 ENCOUNTER — Telehealth: Payer: Self-pay | Admitting: Family Medicine

## 2012-06-29 MED ORDER — MELOXICAM 7.5 MG PO TABS: 7.5000 mg | ORAL_TABLET | Freq: Every day | ORAL | Status: DC

## 2012-06-29 NOTE — Telephone Encounter (Signed)
Sent in as requested 

## 2012-07-06 ENCOUNTER — Other Ambulatory Visit: Payer: Self-pay | Admitting: Urology

## 2012-07-11 ENCOUNTER — Telehealth: Payer: Self-pay | Admitting: Family Medicine

## 2012-07-11 MED ORDER — AMLODIPINE BESYLATE 10 MG PO TABS: 10.0000 mg | ORAL_TABLET | Freq: Every day | ORAL | Status: DC

## 2012-07-11 NOTE — Telephone Encounter (Signed)
Refill sent in

## 2012-07-13 ENCOUNTER — Encounter (HOSPITAL_BASED_OUTPATIENT_CLINIC_OR_DEPARTMENT_OTHER): Payer: Self-pay | Admitting: *Deleted

## 2012-07-13 NOTE — Progress Notes (Signed)
NPO AFTER MN. ARRIVES AT 0615. NEEDS ISTAT . CURRENT EKG AND CHEST CT IN EPIC AND CHART. WILL TAKE ZANTAC AND NORVASC AM OF SURG W/ SIPS OF WATER.

## 2012-07-17 NOTE — H&P (Signed)
History of Present Illness  Johnathan Baldwin has a left distal ureteral calculus.  He has not had any pain since his last visit.  He has not passed a stone either.  He is on Tamsulosin and has been straining his urine.  Urinalysis shows 21-50 WBC's, 3-6 RBC's, moderate leukocyte esterase.   Past Medical History Problems  1. History of  Adult Sleep Apnea 780.57  Surgical History Problems  1. History of  Abdominal Surgery 2. History of  Cystoscopy With Ureteroscopy With Manipulation Of Calculus  Current Meds 1. Ciprofloxacin HCl 500 MG Oral Tablet; Therapy: 22Apr2013 to 2. FentaNYL PT72; Therapy: (Recorded:23Apr2013) to 3. Hydrocodone-Acetaminophen 5-325 MG Oral Tablet; Therapy: 15Apr2013 to 4. Meloxicam 7.5 MG Oral Tablet; Therapy: 18Feb2013 to 5. Oxycodone-Acetaminophen 5-325 MG Oral Tablet; Therapy: 22Apr2013 to 6. Potassium TABS; Therapy: (Recorded:23Apr2013) to 7. Ranitidine HCl 15 MG/ML Oral Syrup; Therapy: 13Feb2012 to 8. Tamsulosin HCl 0.4 MG Oral Capsule; Take one (1) capsule daily; Last Rx:22May2013  Allergies Medication  1. No Known Drug Allergies  Family History Problems  1. Family history of  Family Health Status - Mother's Age 40 2. Family history of  Father Deceased At Age ____ 81's train accident  Social History Problems  1. Caffeine Use 1-2 2. Marital History - Currently Married 3. Never A Smoker Denied  4. History of  Alcohol Use 5. History of  Tobacco Use  Review of Systems Genitourinary, constitutional, skin, eye, otolaryngeal, hematologic/lymphatic, cardiovascular, pulmonary, endocrine, musculoskeletal, gastrointestinal, neurological and psychiatric system(s) were reviewed and pertinent findings if present are noted.    Physical Exam Constitutional: Well nourished and well developed . No acute distress.  ENT:. The ears and nose are normal in appearance.  Neck: The appearance of the neck is normal and no neck mass is present.  Pulmonary: No respiratory  distress and normal respiratory rhythm and effort.  Cardiovascular: Heart rate and rhythm are normal . No peripheral edema.  Abdomen: The abdomen is soft and nontender. No masses are palpated. No CVA tenderness. No hernias are palpable. No hepatosplenomegaly noted.  Rectal: Rectal exam demonstrates normal sphincter tone, no tenderness and no masses. The prostate has no nodularity and is not tender. The left seminal vesicle is nonpalpable. The right seminal vesicle is nonpalpable. The perineum is normal on inspection.  Genitourinary: Examination of the penis demonstrates no discharge, no masses, no lesions and a normal meatus. The scrotum is without lesions. The right epididymis is palpably normal and non-tender. The left epididymis is palpably normal and non-tender. The right testis is non-tender and without masses. The left testis is non-tender and without masses.  Lymphatics: The femoral and inguinal nodes are not enlarged or tender.  Skin: Normal skin turgor, no visible rash and no visible skin lesions.  Neuro/Psych:. Mood and affect are appropriate.    Results/Data Urine [Data Includes: Last 1 Day]   18Jul2013  COLOR YELLOW   APPEARANCE CLOUDY   SPECIFIC GRAVITY 1.015   pH 6.5   GLUCOSE NEG mg/dL  BILIRUBIN NEG   KETONE NEG mg/dL  BLOOD TRACE   PROTEIN NEG mg/dL  UROBILINOGEN 0.2 mg/dL  NITRITE NEG   LEUKOCYTE ESTERASE MOD   SQUAMOUS EPITHELIAL/HPF NONE SEEN   WBC 21-50 WBC/hpf  RBC 3-6 RBC/hpf  BACTERIA NONE SEEN   CRYSTALS NONE SEEN   CASTS NONE SEEN    CT scan reviewed:  8 x 6 mm left distal ureteral calculus still present.      KUB INDICATION: Left distal ureteral calculus.  KUB shows  normal bowel gas pattern.  Psoas shadows are normal.  The left kidney is obscured by stools in the colon.  I can not see the known left renal calculus.  There is no stone in the right kidney.  There is a possible calcification in the area of the left distal ureter that could represent the  ureteral calculus.  There are bilateral phleboliths. IMPRESSION: Possible left distal ureteral calculus. Will get CT scan pelvis for further evaluation.     Assessment Assessed  1. Distal Ureteral Stone On The Left 592.1  Plan  Health Maintenance (V70.0)  1. UA With REFLEX  Done: 18Jul2013 08:15AM Ureteral Stone (592.1)  2. KUB  Done: 18Jul2013 12:00AM   Treatment options: ESL versus ureteroscopy.  The risks, benefits of each option were discussed with Johnathan Baldwin.  Since the stone is distal I advised Johnathan Baldwin to go with ureteroscopy.    The risks of ureteroscopy include but are not limited to hemorrhage, infection, ureteral injury, inability to extract the stone.  He understands and wishes to proceed.  Urine culture.  Treat if culture is positive.                                                                                   URINE CULTURE  Status: In Progress - Specimen/Data Collected  Done: 18Jul2013 Ordered Today; For: Urinary Tract Infection (599.0); Ordered By: Su Grand  PerformLoney Loh  Due: 20Jul2013 Marked Important; Last Updated By: Milagros Reap Electronically signed by : Su Grand, M.D.; Jul 06 2012  2:40PM

## 2012-07-18 ENCOUNTER — Encounter (HOSPITAL_BASED_OUTPATIENT_CLINIC_OR_DEPARTMENT_OTHER): Payer: Self-pay | Admitting: Anesthesiology

## 2012-07-18 ENCOUNTER — Encounter (HOSPITAL_BASED_OUTPATIENT_CLINIC_OR_DEPARTMENT_OTHER): Payer: Self-pay | Admitting: *Deleted

## 2012-07-18 ENCOUNTER — Ambulatory Visit (HOSPITAL_BASED_OUTPATIENT_CLINIC_OR_DEPARTMENT_OTHER)
Admission: RE | Admit: 2012-07-18 | Discharge: 2012-07-18 | Disposition: A | Discharge status: Home / Self Care | Payer: 59 | Source: Ambulatory Visit | Attending: Urology | Admitting: Urology

## 2012-07-18 ENCOUNTER — Ambulatory Visit (HOSPITAL_BASED_OUTPATIENT_CLINIC_OR_DEPARTMENT_OTHER): Payer: 59 | Admitting: Anesthesiology

## 2012-07-18 ENCOUNTER — Encounter (HOSPITAL_BASED_OUTPATIENT_CLINIC_OR_DEPARTMENT_OTHER)
Admission: RE | Disposition: A | Discharge status: Home / Self Care | Payer: Self-pay | Source: Ambulatory Visit | Attending: Urology

## 2012-07-18 DIAGNOSIS — N201 Calculus of ureter: Secondary | ICD-10-CM | POA: Insufficient documentation

## 2012-07-18 DIAGNOSIS — G4730 Sleep apnea, unspecified: Secondary | ICD-10-CM | POA: Insufficient documentation

## 2012-07-18 DIAGNOSIS — Z79899 Other long term (current) drug therapy: Secondary | ICD-10-CM | POA: Insufficient documentation

## 2012-07-18 HISTORY — DX: Cardiac murmur, unspecified: R01.1

## 2012-07-18 HISTORY — DX: Gastro-esophageal reflux disease without esophagitis: K21.9

## 2012-07-18 HISTORY — DX: Personal history of urinary calculi: Z87.442

## 2012-07-18 HISTORY — DX: Calculus of ureter: N20.1

## 2012-07-18 LAB — POCT I-STAT 4, (NA,K, GLUC, HGB,HCT)
Glucose, Bld: 95 mg/dL (ref 70–99)
Potassium: 3.7 mEq/L (ref 3.5–5.1)
Sodium: 143 mEq/L (ref 135–145)

## 2012-07-18 LAB — POCT I-STAT, CHEM 8
BUN: 14 mg/dL (ref 6–23)
Calcium, Ion: 1.23 mmol/L (ref 1.12–1.23)
Creatinine, Ser: 1.2 mg/dL (ref 0.50–1.35)
Hemoglobin: 13.6 g/dL (ref 13.0–17.0)
Sodium: 143 mEq/L (ref 135–145)
TCO2: 24 mmol/L (ref 0–100)

## 2012-07-18 SURGERY — CYSTOURETEROSCOPY, WITH RETROGRADE PYELOGRAM AND STENT INSERTION
Anesthesia: General | Site: Ureter | Laterality: Left | Wound class: Clean Contaminated

## 2012-07-18 MED ORDER — DEXAMETHASONE SODIUM PHOSPHATE 4 MG/ML IJ SOLN
INTRAMUSCULAR | Status: DC | PRN
  Administered 2012-07-18: 10 mg via INTRAVENOUS

## 2012-07-18 MED ORDER — LIDOCAINE HCL 2 % EX GEL
CUTANEOUS | Status: DC | PRN
  Administered 2012-07-18: 1

## 2012-07-18 MED ORDER — IOHEXOL 350 MG/ML SOLN
INTRAVENOUS | Status: DC | PRN
  Administered 2012-07-18: 12 mL via INTRAVENOUS

## 2012-07-18 MED ORDER — LACTATED RINGERS IV SOLN
INTRAVENOUS | Status: DC
  Administered 2012-07-18 (×3): via INTRAVENOUS

## 2012-07-18 MED ORDER — FENTANYL CITRATE 0.05 MG/ML IJ SOLN
INTRAMUSCULAR | Status: DC | PRN
  Administered 2012-07-18 (×4): 25 ug via INTRAVENOUS
  Administered 2012-07-18 (×2): 50 ug via INTRAVENOUS
  Administered 2012-07-18: 25 ug via INTRAVENOUS
  Administered 2012-07-18 (×2): 50 ug via INTRAVENOUS
  Administered 2012-07-18 (×3): 25 ug via INTRAVENOUS

## 2012-07-18 MED ORDER — EPHEDRINE SULFATE 50 MG/ML IJ SOLN
INTRAMUSCULAR | Status: DC | PRN
  Administered 2012-07-18 (×2): 10 mg via INTRAVENOUS

## 2012-07-18 MED ORDER — GLYCOPYRROLATE 0.2 MG/ML IJ SOLN
INTRAMUSCULAR | Status: DC | PRN
  Administered 2012-07-18: 0.2 mg via INTRAVENOUS

## 2012-07-18 MED ORDER — LIDOCAINE HCL (CARDIAC) 20 MG/ML IV SOLN
INTRAVENOUS | Status: DC | PRN
  Administered 2012-07-18: 75 mg via INTRAVENOUS

## 2012-07-18 MED ORDER — FENTANYL CITRATE 0.05 MG/ML IJ SOLN: 25.0000 ug | INTRAMUSCULAR | Status: DC | PRN

## 2012-07-18 MED ORDER — CEFAZOLIN SODIUM 1-5 GM-% IV SOLN: 1.0000 g | INTRAVENOUS | Status: DC

## 2012-07-18 MED ORDER — HYDROCODONE-ACETAMINOPHEN 5-500 MG PO CAPS: 1.0000 | ORAL_CAPSULE | Freq: Four times a day (QID) | ORAL | Status: AC | PRN

## 2012-07-18 MED ORDER — PROPOFOL 10 MG/ML IV EMUL
INTRAVENOUS | Status: DC | PRN
  Administered 2012-07-18: 250 mg via INTRAVENOUS

## 2012-07-18 MED ORDER — ONDANSETRON HCL 4 MG/2ML IJ SOLN
INTRAMUSCULAR | Status: DC | PRN
  Administered 2012-07-18: 4 mg via INTRAVENOUS

## 2012-07-18 MED ORDER — CEPHALEXIN 250 MG PO CAPS: 250.0000 mg | ORAL_CAPSULE | Freq: Four times a day (QID) | ORAL | Status: AC

## 2012-07-18 MED ORDER — SODIUM CHLORIDE 0.9 % IR SOLN
Status: DC | PRN
  Administered 2012-07-18: 6000 mL

## 2012-07-18 SURGICAL SUPPLY — 40 items
ADAPTER CATH URET PLST 4-6FR (CATHETERS) IMPLANT
ADPR CATH URET STRL DISP 4-6FR (CATHETERS)
BAG DRAIN URO-CYSTO SKYTR STRL (DRAIN) ×2 IMPLANT
BAG DRN UROCATH (DRAIN) ×1
BASKET LASER NITINOL 1.9FR (BASKET) IMPLANT
BASKET STNLS GEMINI 4WIRE 3FR (BASKET) IMPLANT
BASKET ZERO TIP NITINOL 2.4FR (BASKET) IMPLANT
BRUSH URET BIOPSY 3F (UROLOGICAL SUPPLIES) IMPLANT
BSKT STON RTRVL 120 1.9FR (BASKET)
BSKT STON RTRVL GEM 120X11 3FR (BASKET)
BSKT STON RTRVL ZERO TP 2.4FR (BASKET)
CANISTER SUCT LVC 12 LTR MEDI- (MISCELLANEOUS) ×1 IMPLANT
CATH CLEAR GEL 3F BACKSTOP (CATHETERS) ×2 IMPLANT
CATH INTERMIT  6FR 70CM (CATHETERS) ×1 IMPLANT
CATH URET 5FR 28IN CONE TIP (BALLOONS) ×1
CATH URET 5FR 28IN OPEN ENDED (CATHETERS) IMPLANT
CATH URET 5FR 70CM CONE TIP (BALLOONS) IMPLANT
CLOTH BEACON ORANGE TIMEOUT ST (SAFETY) ×2 IMPLANT
DRAPE CAMERA CLOSED 9X96 (DRAPES) ×1 IMPLANT
GLOVE BIO SURGEON STRL SZ 6.5 (GLOVE) ×1 IMPLANT
GLOVE BIO SURGEON STRL SZ7 (GLOVE) ×2 IMPLANT
GLOVE INDICATOR 7.0 STRL GRN (GLOVE) ×1 IMPLANT
GOWN STRL NON-REIN LRG LVL3 (GOWN DISPOSABLE) ×1 IMPLANT
GOWN STRL REIN XL XLG (GOWN DISPOSABLE) ×1 IMPLANT
GUIDEWIRE 0.038 PTFE COATED (WIRE) ×2 IMPLANT
GUIDEWIRE ANG ZIPWIRE 038X150 (WIRE) ×1 IMPLANT
GUIDEWIRE STR DUAL SENSOR (WIRE) ×1 IMPLANT
IV NS IRRIG 3000ML ARTHROMATIC (IV SOLUTION) ×2 IMPLANT
KIT BALLIN UROMAX 15FX10 (LABEL) IMPLANT
KIT BALLN UROMAX 15FX4 (MISCELLANEOUS) IMPLANT
KIT BALLN UROMAX 26 75X4 (MISCELLANEOUS)
LASER FIBER DISP (UROLOGICAL SUPPLIES) ×1 IMPLANT
LASER FIBER DISP 1000U (UROLOGICAL SUPPLIES) IMPLANT
NS IRRIG 500ML POUR BTL (IV SOLUTION) IMPLANT
PACK CYSTOSCOPY (CUSTOM PROCEDURE TRAY) ×2 IMPLANT
SET HIGH PRES BAL DIL (LABEL)
SHEATH URET ACCESS 12FR/35CM (UROLOGICAL SUPPLIES) ×1 IMPLANT
SHEATH URET ACCESS 12FR/55CM (UROLOGICAL SUPPLIES) IMPLANT
STENT URET 6FRX24 CONTOUR (STENTS) ×1 IMPLANT
SYR 5ML LL (SYRINGE) ×1 IMPLANT

## 2012-07-18 NOTE — Op Note (Signed)
WHIT BRUNI is a 56 y.o.   07/18/2012  General  Preop diagnosis: Left distal ureteral calculus  Postop diagnosis: Same  Procedure done: Cystoscopy, left retrograde pyelogram, ureteroscopy, holmium laser of the left ureteral calculus, stone extraction, insertion of double-J stent  Surgeon: Wendie Simmer. Noa Constante  Anesthesia: General  Indication: Patient is a 56 years old male with a left and distal ureteral stone that he has had for about 2 months. He has not been having any pain; however her he has not been able to pass the stone. CT scan a week ago showed an 8 x 6 mm stone in the left distal ureter. He is scheduled today for stone manipulation  Procedure: Patient was identified by his wrist band and proper timeout was taken.  Under general anesthesia patient was prepped and draped and placed in the dorsolithotomy position. A panendoscope was inserted in the bladder. The anterior urethra is normal. There is minimal prostatic hypertrophy. The bladder mucosa is normal. There is no stone or tumor in the bladder. The ureteral orifices are in normal position and shape.  Retrograde pyelogram:  A cone-tip catheter was passed through the cystoscope and the left ureteral orifice. 5 cc of Omnipaque were then  injected through the cone-tip catheter. There is a filling defect in the distal ureter with moderate dilation of the ureter proximal to the stone. I did not inject the contrast under pressure and there was no contrast in the mid or proximal ureter. The cone-tip catheter was then removed. A sensor wire was passed through the cystoscope and the left ureteral orifice but could not be advanced beyond the intramural ureter. The sensor wire was removed. A glide wire was passed through a #6 [place] open-ended catheter. The wire and the open-ended catheter were then passed the cystoscope and the left ureter. The Glidewire was advanced up proximal ureter and the open-ended catheter was advanced over the Glidewire  in the mid ureter. The Glidewire was then removed and replaced with a sensor wire. The open-ended catheter was removed. The bladder was then emptied and the cystoscope removed.  The intramural ureter was then dilated with the inner sheath of the ureteroscope access sheath. Then a #6.5 French semirigid ureteroscope was passed in the left distal ureter. The stone was identified. A back stopper catheter was then passed through the ureteroscope proximal to the stone. The back stopper was then injected in the ureter. The back stopper catheter was removed. With a 365 microfiber holmium laser the stone was fragmented in smaller fragments. The larger stone fragments were removed with the Nitinol basket. There fragments are very small and he should be able to pass them without difficulty.  The ureteroscope was removed. The sensor wire was then backloaded into the cystoscope and a #6 French-24 double-J stent was passed over the sensor wire. The proximal curl of the double-J stent is in the renal pelvis and the distal curl is in the bladder. The bladder was then emptied and the cystoscope and sensor wire removed.  The patient tolerated the procedure well and left the OR in satisfactory condition to postanesthesia care unit.

## 2012-07-18 NOTE — Anesthesia Preprocedure Evaluation (Signed)
Anesthesia Evaluation  Patient identified by MRN, date of birth, ID band Patient awake    Reviewed: Allergy & Precautions, H&P , NPO status , Patient's Chart, lab work & pertinent test results, reviewed documented beta blocker date and time   Airway Mallampati: II TM Distance: >3 FB Neck ROM: Full    Dental  (+) Dental Advisory Given and Teeth Intact   Pulmonary neg pulmonary ROS,  breath sounds clear to auscultation        Cardiovascular hypertension, Pt. on medications Rhythm:Regular  Denies cardiac symptoms   Neuro/Psych Chronic pain negative psych ROS   GI/Hepatic negative GI ROS, Neg liver ROS,   Endo/Other  negative endocrine ROS  Renal/GU Kidney stone  negative genitourinary   Musculoskeletal negative musculoskeletal ROS (+)   Abdominal   Peds negative pediatric ROS (+)  Hematology negative hematology ROS (+)   Anesthesia Other Findings   Reproductive/Obstetrics negative OB ROS                           Anesthesia Physical Anesthesia Plan  ASA: II  Anesthesia Plan: General   Post-op Pain Management:    Induction: Intravenous  Airway Management Planned: LMA  Additional Equipment:   Intra-op Plan:   Post-operative Plan:   Informed Consent: I have reviewed the patients History and Physical, chart, labs and discussed the procedure including the risks, benefits and alternatives for the proposed anesthesia with the patient or authorized representative who has indicated his/her understanding and acceptance.   Dental advisory given  Plan Discussed with: CRNA and Surgeon  Anesthesia Plan Comments:         Anesthesia Quick Evaluation

## 2012-07-18 NOTE — Anesthesia Postprocedure Evaluation (Signed)
  Anesthesia Post-op Note  Patient: Johnathan Baldwin  Procedure(s) Performed: Procedure(s) (LRB): CYSTOSCOPY WITH RETROGRADE PYELOGRAM, URETEROSCOPY AND STENT PLACEMENT (Left) HOLMIUM LASER APPLICATION (Left)  Patient Location: PACU  Anesthesia Type: General  Level of Consciousness: oriented and sedated  Airway and Oxygen Therapy: Patient Spontanous Breathing  Post-op Pain: mild  Post-op Assessment: Post-op Vital signs reviewed, Patient's Cardiovascular Status Stable, Respiratory Function Stable and Patent Airway  Post-op Vital Signs: stable  Complications: No apparent anesthesia complications

## 2012-07-18 NOTE — Transfer of Care (Signed)
Immediate Anesthesia Transfer of Care Note  Patient: Johnathan Baldwin  Procedure(s) Performed: Procedure(s) (LRB): CYSTOSCOPY WITH RETROGRADE PYELOGRAM, URETEROSCOPY AND STENT PLACEMENT (Left) HOLMIUM LASER APPLICATION (Left)  Patient Location: Patient transported to PACU with oxygen via face mask at 4 Liters / Min  Anesthesia Type: General  Level of Consciousness: awake and alert   Airway & Oxygen Therapy: Patient Spontanous Breathing and Patient connected to face mask oxygen  Post-op Assessment: Report given to PACU RN and Post -op Vital signs reviewed and stable  Post vital signs: Reviewed and stable  Dentition: Teeth and oropharynx remain in pre-op condition  Complications: No apparent anesthesia complications

## 2012-07-18 NOTE — Anesthesia Procedure Notes (Signed)
Procedure Name: LMA Insertion Date/Time: 07/18/2012 7:40 AM Performed by: Fran Lowes Pre-anesthesia Checklist: Patient identified, Emergency Drugs available, Suction available and Patient being monitored Patient Re-evaluated:Patient Re-evaluated prior to inductionOxygen Delivery Method: Circle System Utilized Preoxygenation: Pre-oxygenation with 100% oxygen Intubation Type: IV induction Ventilation: Mask ventilation without difficulty LMA: LMA inserted LMA Size: 4.0 Number of attempts: 1 Airway Equipment and Method: bite block Placement Confirmation: positive ETCO2 Tube secured with: Tape Dental Injury: Teeth and Oropharynx as per pre-operative assessment  Comments: LMA inserted by Dr. Shireen Quan.

## 2012-10-06 ENCOUNTER — Encounter: Payer: Self-pay | Admitting: Family Medicine

## 2012-10-06 ENCOUNTER — Ambulatory Visit (INDEPENDENT_AMBULATORY_CARE_PROVIDER_SITE_OTHER): Payer: 59 | Admitting: Family Medicine

## 2012-10-06 VITALS — BP 112/72 | HR 60 | Resp 15 | Ht 69.5 in | Wt 148.4 lb

## 2012-10-06 DIAGNOSIS — I10 Essential (primary) hypertension: Secondary | ICD-10-CM

## 2012-10-06 NOTE — Progress Notes (Signed)
  Subjective:    Patient ID: Johnathan Baldwin, male    DOB: 1956-07-25, 56 y.o.   MRN: [id number]  HPI  Pt here to f/u blood pressure, doing well, since our last visit had left kidney stone removed  No changes in medication Declines flu shot Had knee pain 3 weeks ago now resolved Review of Systems  GEN- denies fatigue, fever, weight loss,weakness, recent illness HEENT- denies eye drainage, change in vision, nasal discharge, CVS- denies chest pain, palpitations RESP- denies SOB, cough, wheeze ABD- denies N/V, change in stools, abd pain GU- denies dysuria, hematuria, dribbling, incontinence MSK- denies joint pain, muscle aches, injury Neuro- denies headache, dizziness, syncope, seizure activity      Objective:   Physical Exam GEN- NAD, alert and oriented x3 HEENT- PERRL, EOMI, non injected sclera, pink conjunctiva, MMM, oropharynx clear Neck- Supple CVS- RRR, no murmur RESP-CTAB EXT- No edema Pulses- Radial 2+        Assessment & Plan:

## 2012-10-06 NOTE — Assessment & Plan Note (Signed)
Well controlled, no change to meds 

## 2012-10-06 NOTE — Patient Instructions (Addendum)
Blood pressure looks good! F/U 6 months

## 2012-10-09 ENCOUNTER — Other Ambulatory Visit: Payer: Self-pay | Admitting: Family Medicine

## 2012-11-08 ENCOUNTER — Other Ambulatory Visit: Payer: Self-pay | Admitting: Family Medicine

## 2012-12-19 ENCOUNTER — Other Ambulatory Visit: Payer: Self-pay | Admitting: Family Medicine

## 2012-12-19 DIAGNOSIS — Z0183 Encounter for blood typing: Secondary | ICD-10-CM

## 2013-01-02 ENCOUNTER — Other Ambulatory Visit: Payer: Self-pay | Admitting: Family Medicine

## 2013-01-02 DIAGNOSIS — Z205 Contact with and (suspected) exposure to viral hepatitis: Secondary | ICD-10-CM

## 2013-01-04 ENCOUNTER — Other Ambulatory Visit: Payer: Self-pay | Admitting: Family Medicine

## 2013-01-09 ENCOUNTER — Other Ambulatory Visit: Payer: Self-pay | Admitting: Family Medicine

## 2013-03-23 ENCOUNTER — Encounter: Payer: Self-pay | Admitting: Family Medicine

## 2013-03-23 ENCOUNTER — Ambulatory Visit (INDEPENDENT_AMBULATORY_CARE_PROVIDER_SITE_OTHER): Payer: 59 | Admitting: Family Medicine

## 2013-03-23 VITALS — BP 118/74 | HR 56 | Resp 16 | Wt 148.1 lb

## 2013-03-23 DIAGNOSIS — I10 Essential (primary) hypertension: Secondary | ICD-10-CM

## 2013-03-23 DIAGNOSIS — Z1211 Encounter for screening for malignant neoplasm of colon: Secondary | ICD-10-CM | POA: Insufficient documentation

## 2013-03-23 DIAGNOSIS — R911 Solitary pulmonary nodule: Secondary | ICD-10-CM

## 2013-03-23 DIAGNOSIS — E876 Hypokalemia: Secondary | ICD-10-CM

## 2013-03-23 NOTE — Assessment & Plan Note (Signed)
RML nodule found on CTA 1 year ago, will obtain f/u CT scan

## 2013-03-23 NOTE — Progress Notes (Signed)
  Subjective:    Patient ID: Johnathan Baldwin, male    DOB: 05-16-1956, 57 y.o.   MRN: [id number]  HPI  Patient here to follow chronic medical problems. He has no concerns today. Due for fasting labs. He is overdue for colonoscopy. He still being followed at [facility] for chronic pain in his colostomy bag. He is having difficulty with his potassium coming out in bag   , blood pressure medication is being  Review of Systems   GEN- denies fatigue, fever, weight loss,weakness, recent illness HEENT- denies eye drainage, change in vision, nasal discharge, CVS- denies chest pain, palpitations RESP- denies SOB, cough, wheeze ABD- denies N/V, change in stools, abd pain GU- denies dysuria, hematuria, dribbling, incontinence MSK- denies joint pain, muscle aches, injury Neuro- denies headache, dizziness, syncope, seizure activity      Objective:   Physical Exam GEN- NAD, alert and oriented x3 HEENT- PERRL, EOMI, non injected sclera, pink conjunctiva, MMM, oropharynx clear CVS- RRR, no murmur RESP-CTAB EXT- No edema Pulses- Radial, DP- 2+        Assessment & Plan:

## 2013-03-23 NOTE — Assessment & Plan Note (Addendum)
He is in need of colon cancer screening. Will contact his surgeon at [facility] Dr. Cherly Hensen to see if he can perform scope if not GI at [facility]

## 2013-03-23 NOTE — Assessment & Plan Note (Addendum)
Chronic hypokalemia due to absorption problems, will try him on Effervescent KCL

## 2013-03-23 NOTE — Assessment & Plan Note (Signed)
BP has been low normotensive though he is asymptomatic I will go ahead and decrease his amlodipine to 5 mg daily. We may be a titration completely off of his blood pressure medication

## 2013-03-23 NOTE — Patient Instructions (Addendum)
Referral to Dr. Verl Bangs for colonoscopy  I will check on a new potassium supplement Decrease blood pressure pill to 1/2 tablet Get the labs the done fasting F/U 4 month -- Physical

## 2013-03-26 LAB — CBC
HCT: 39.5 % (ref 39.0–52.0)
Hemoglobin: 12.9 g/dL — ABNORMAL LOW (ref 13.0–17.0)
MCHC: 32.7 g/dL (ref 30.0–36.0)

## 2013-03-27 ENCOUNTER — Encounter: Payer: Self-pay | Admitting: Family Medicine

## 2013-03-27 LAB — COMPREHENSIVE METABOLIC PANEL
AST: 24 U/L (ref 0–37)
Alkaline Phosphatase: 79 U/L (ref 39–117)
BUN: 12 mg/dL (ref 6–23)
Calcium: 9.7 mg/dL (ref 8.4–10.5)
Chloride: 104 mEq/L (ref 96–112)
Creat: 1.08 mg/dL (ref 0.50–1.35)
Total Bilirubin: 0.8 mg/dL (ref 0.3–1.2)

## 2013-03-27 LAB — LIPID PANEL
Cholesterol: 201 mg/dL — ABNORMAL HIGH (ref 0–200)
HDL: 115 mg/dL (ref >39)
Total CHOL/HDL Ratio: 1.7 Ratio
Triglycerides: 56 mg/dL (ref <150)
VLDL: 11 mg/dL (ref 0–40)

## 2013-04-02 ENCOUNTER — Ambulatory Visit (HOSPITAL_COMMUNITY)
Admission: RE | Admit: 2013-04-02 | Discharge: 2013-04-02 | Disposition: A | Discharge status: Home / Self Care | Payer: 59 | Source: Ambulatory Visit | Attending: Family Medicine | Admitting: Family Medicine

## 2013-04-02 DIAGNOSIS — R911 Solitary pulmonary nodule: Secondary | ICD-10-CM | POA: Insufficient documentation

## 2013-04-02 DIAGNOSIS — Z09 Encounter for follow-up examination after completed treatment for conditions other than malignant neoplasm: Secondary | ICD-10-CM | POA: Insufficient documentation

## 2013-05-01 ENCOUNTER — Other Ambulatory Visit: Payer: Self-pay | Admitting: Family Medicine

## 2013-05-03 ENCOUNTER — Telehealth: Payer: Self-pay | Admitting: Family Medicine

## 2013-05-03 DIAGNOSIS — K6289 Other specified diseases of anus and rectum: Secondary | ICD-10-CM

## 2013-05-03 DIAGNOSIS — Z1211 Encounter for screening for malignant neoplasm of colon: Secondary | ICD-10-CM

## 2013-05-03 NOTE — Telephone Encounter (Signed)
Spoke with surgeon he is recommending a barium study to look at large bowel because they were unable to visualize the entire colon Discussed with pt, he agrees to proceed, I will contact radiology to get this set up

## 2013-05-03 NOTE — Telephone Encounter (Signed)
Discussed colonoscopy with patient. 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 I will call the gastroenterologist Dr. Othelia Pulling to see if this is needed at this time.

## 2013-05-11 NOTE — Addendum Note (Signed)
Addended by: Kandis Fantasia B on: 05/11/2013 09:27 AM   Modules accepted: Orders

## 2013-07-23 ENCOUNTER — Encounter: Payer: Self-pay | Admitting: Family Medicine

## 2013-07-23 ENCOUNTER — Ambulatory Visit: Payer: 59 | Admitting: Family Medicine

## 2013-07-23 ENCOUNTER — Ambulatory Visit (INDEPENDENT_AMBULATORY_CARE_PROVIDER_SITE_OTHER): Payer: 59 | Admitting: Family Medicine

## 2013-07-23 VITALS — BP 152/90 | HR 64 | Temp 97.0°F | Resp 16 | Ht 69.5 in | Wt 150.0 lb

## 2013-07-23 DIAGNOSIS — G8929 Other chronic pain: Secondary | ICD-10-CM

## 2013-07-23 DIAGNOSIS — E876 Hypokalemia: Secondary | ICD-10-CM

## 2013-07-23 DIAGNOSIS — R109 Unspecified abdominal pain: Secondary | ICD-10-CM

## 2013-07-23 DIAGNOSIS — Z1211 Encounter for screening for malignant neoplasm of colon: Secondary | ICD-10-CM

## 2013-07-23 DIAGNOSIS — K5120 Ulcerative (chronic) proctitis without complications: Secondary | ICD-10-CM

## 2013-07-23 DIAGNOSIS — I10 Essential (primary) hypertension: Secondary | ICD-10-CM

## 2013-07-23 DIAGNOSIS — K6289 Other specified diseases of anus and rectum: Secondary | ICD-10-CM | POA: Insufficient documentation

## 2013-07-23 LAB — BASIC METABOLIC PANEL
BUN: 15 mg/dL (ref 6–23)
Glucose, Bld: 117 mg/dL — ABNORMAL HIGH (ref 70–99)
Potassium: 4.1 mEq/L (ref 3.5–5.3)

## 2013-07-23 MED ORDER — RANITIDINE HCL 15 MG/ML PO SYRP: 5.0000 mg | ORAL_SOLUTION | Freq: Two times a day (BID) | ORAL | Status: DC

## 2013-07-23 MED ORDER — AMLODIPINE BESYLATE 10 MG PO TABS: 10.0000 mg | ORAL_TABLET | Freq: Every day | ORAL | Status: DC

## 2013-07-23 NOTE — Patient Instructions (Signed)
We will call with lab results  Medications refilled Study to be done with barium at [facility]  F/U 4 months

## 2013-07-23 NOTE — Assessment & Plan Note (Signed)
Uncontrolled, return to 1 tablet daily

## 2013-07-23 NOTE — Assessment & Plan Note (Signed)
Last potassium level on higher end, has history of hypokalemia, repeat labs today, he has been off k supplement

## 2013-07-23 NOTE — Assessment & Plan Note (Signed)
Doing well, pain controlled by fentanyl given by surgeon at [facility]

## 2013-07-23 NOTE — Assessment & Plan Note (Signed)
Send for barium of colon through colostomy

## 2013-07-23 NOTE — Progress Notes (Signed)
  Subjective:    Patient ID: Johnathan Baldwin, male    DOB: 1956-08-08, 57 y.o.   MRN: [id number]  HPI  Patient here to follow chronic medical problems. No specific concerns today. He's been taking a half a tablet of amlodipine secondary to low normal tensive blood pressure last few visits however now his blood pressure spiked back up. He denies any chest pain, shortness of breath or headache. Not been taking his potassium secondary to hyperkalemia at her last check He's not had his barium swallow done which was recommended by GI as they were unable to complete his colonoscopy. Occasionally he gets some swelling around his ankles that resolved when he puts his legs up. Review of Systems  GEN- denies fatigue, fever, weight loss,weakness, recent illness HEENT- denies eye drainage, change in vision, nasal discharge, CVS- denies chest pain, palpitations RESP- denies SOB, cough, wheeze ABD- denies N/V, change in stools, abd pain GU- denies dysuria, hematuria, dribbling, incontinence MSK- denies joint pain, muscle aches, injury Neuro- denies headache, dizziness, syncope, seizure activity  c   Objective:   Physical Examt  GEN- NAD, alert and oriented x3 HEENT- PERRL, EOMI, non injected sclera, pink conjunctiva, MMM, oropharynx clear Neck- Supple,  CVS- RRR, no murmur RESP-CTAB EXT- No edema Pulses- Radial, DP- 2+       Assessment & Plan:

## 2013-09-18 ENCOUNTER — Telehealth: Payer: Self-pay | Admitting: Family Medicine

## 2013-09-18 MED ORDER — MELOXICAM 7.5 MG PO TABS: 7.5000 mg | ORAL_TABLET | Freq: Every day | ORAL | Status: DC

## 2013-09-18 NOTE — Telephone Encounter (Signed)
Meloxicam 7.5mg  one PO QD #30  Medication refilled per protocol.

## 2013-11-23 ENCOUNTER — Ambulatory Visit (INDEPENDENT_AMBULATORY_CARE_PROVIDER_SITE_OTHER): Payer: 59 | Admitting: Family Medicine

## 2013-11-23 ENCOUNTER — Encounter: Payer: Self-pay | Admitting: Family Medicine

## 2013-11-23 VITALS — BP 138/88 | HR 68 | Temp 98.0°F | Resp 18 | Ht 69.5 in | Wt 151.0 lb

## 2013-11-23 DIAGNOSIS — I10 Essential (primary) hypertension: Secondary | ICD-10-CM

## 2013-11-23 DIAGNOSIS — M25512 Pain in left shoulder: Secondary | ICD-10-CM

## 2013-11-23 DIAGNOSIS — R109 Unspecified abdominal pain: Secondary | ICD-10-CM

## 2013-11-23 DIAGNOSIS — E876 Hypokalemia: Secondary | ICD-10-CM

## 2013-11-23 DIAGNOSIS — G8929 Other chronic pain: Secondary | ICD-10-CM

## 2013-11-23 DIAGNOSIS — M25519 Pain in unspecified shoulder: Secondary | ICD-10-CM

## 2013-11-23 LAB — BASIC METABOLIC PANEL
BUN: 13 mg/dL (ref 6–23)
Calcium: 9.1 mg/dL (ref 8.4–10.5)
Glucose, Bld: 107 mg/dL — ABNORMAL HIGH (ref 70–99)
Sodium: 138 mEq/L (ref 135–145)

## 2013-11-23 MED ORDER — MELOXICAM 15 MG PO TABS: 15.0000 mg | ORAL_TABLET | Freq: Every day | ORAL | Status: DC

## 2013-11-23 MED ORDER — FENTANYL 25 MCG/HR TD PT72: 25.0000 ug | MEDICATED_PATCH | TRANSDERMAL | Status: DC

## 2013-11-23 NOTE — Patient Instructions (Signed)
We will send letter with lab results  Mobic 15mg  once a day for the next 4 weeks Pain medication refilled  F/U 4 months

## 2013-11-23 NOTE — Progress Notes (Signed)
   Subjective:    Patient ID: Johnathan Baldwin, male    DOB: [birth year], 57 y.o.   MRN: [id number]  HPI  Pt here to f/u chronic medical problems, complains of left shoulder pain x 4 weeks he  remembers increasing his weight and after the workout he began to have some pain. He's been taking meloxicam 7.5 mg for the past couple of days with some improvement. He denies any paresthesias into his hand he denies any weakness.  He has a history of chronic abdominal pain secondary to surgery which resulted in his chronic colostomy. He's been getting his fentanyl patches mailed to him from [facility] for the past 2 years however he has had difficulty getting them on a regular basis as they come to the mail. He no longer follows up at the clinic. He asked if these can be done locally     Review of Systems  GEN- denies fatigue, fever, weight loss,weakness, recent illness HEENT- denies eye drainage, change in vision, nasal discharge, CVS- denies chest pain, palpitations RESP- denies SOB, cough, wheeze ABD- denies N/V, change in stools, abd pain GU- denies dysuria, hematuria, dribbling, incontinence MSK- + joint pain, muscle aches, injury Neuro- denies headache, dizziness, syncope, seizure activity      Objective:   Physical Exam GEN- NAD, alert and oriented x3 HEENT- PERRL, EOMI, non injected sclera, pink conjunctiva, MMM, oropharynx clear Neck- Supple, FROM CVS- RRR, no murmur RESP-CTAB EXT- No edema MSK- Rotator cuff in tact, normla inspection shoulder bilat, good ROM, TTP along sulcus of deltoid, neg impingment signs Pulses- Radial, DP- 2+        Assessment & Plan:

## 2013-11-25 DIAGNOSIS — M25512 Pain in left shoulder: Secondary | ICD-10-CM | POA: Insufficient documentation

## 2013-11-25 NOTE — Assessment & Plan Note (Signed)
Blood pressure much improved with Norvasc 10 mg she will continue also check his metabolic panel for his potassium

## 2013-11-25 NOTE — Assessment & Plan Note (Signed)
He is on chronic fentanyl patches. He's been having difficulty getting these through the mail in a timely manner from [facility]. He is no longer being seen there on a regular basis. I will go ahead and take over his prescription for his fentanyl patches

## 2013-11-25 NOTE — Assessment & Plan Note (Signed)
I think this more of a strain from when he was working out. He has good strength in the arm. Will increase his meloxicam to 50 mg daily for the next 4 weeks he'll also use ice and try some range of motion he'll decrease his weights

## 2013-11-26 ENCOUNTER — Encounter: Payer: Self-pay | Admitting: *Deleted

## 2014-01-18 ENCOUNTER — Telehealth: Payer: Self-pay | Admitting: *Deleted

## 2014-01-18 MED ORDER — MELOXICAM 15 MG PO TABS: 15.0000 mg | ORAL_TABLET | Freq: Every day | ORAL | Status: DC

## 2014-01-18 MED ORDER — FENTANYL 25 MCG/HR TD PT72: 25.0000 ug | MEDICATED_PATCH | TRANSDERMAL | Status: DC

## 2014-01-18 NOTE — Telephone Encounter (Signed)
Med printed and ready for dr approval and faced to [place]

## 2014-01-18 NOTE — Telephone Encounter (Signed)
Message copied by Samuella CotaSIMMONS, Marlei Glomski T on Fri Jan 18, 2014  3:35 PM ------      Message from: Eustaquio MaizeMEDLIN, SARAH J      Created: Fri Jan 18, 2014 11:53 AM      Contact: [phone]       Needs refill on Mobic and Fentanyl patches.  Call him at [phone] ------

## 2014-01-18 NOTE — Telephone Encounter (Signed)
Dr. Jeanice Limurham approved and ready for signature

## 2014-01-18 NOTE — Telephone Encounter (Signed)
Message copied by Samuella CotaSIMMONS, Maleka Contino T on Fri Jan 18, 2014  3:38 PM ------      Message from: Milinda AntisURHAM, KAWANTA F      Created: Fri Jan 18, 2014  1:18 PM      Regarding: RE: refill      Contact: [phone]       OKAY TO REFILL,      ----- Message -----         From: Elvina MattesSabrina Greogory Cornette, CMA         Sent: 01/18/2014  12:41 PM           To: Salley ScarletKawanta F [place], MD      Subject: refill                                                   ? Ok to refill, last refill on patches 11/23/13  last rf mobic 12/24/13      ----- Message -----         From: Lindalou HoseSarah J Medlin         Sent: 01/18/2014  11:53 AM           To: Elvina MattesSabrina Hassaan Crite, CMA            Needs refill on Mobic and Fentanyl patches.  Call him at [phone]             ------

## 2014-01-22 ENCOUNTER — Telehealth: Payer: Self-pay | Admitting: Family Medicine

## 2014-01-22 NOTE — Telephone Encounter (Signed)
meloxicam (MOBIC) 15 MG tablet and pain patch refilled Call back number is [phone]

## 2014-01-22 NOTE — Telephone Encounter (Signed)
Ok to refill 

## 2014-01-22 NOTE — Telephone Encounter (Signed)
Okay to refill these medications, it looks like they were refilled on 1/30, see if script is already printed and signed

## 2014-01-23 ENCOUNTER — Other Ambulatory Visit: Payer: Self-pay | Admitting: Family Medicine

## 2014-01-23 MED ORDER — FENTANYL 25 MCG/HR TD PT72: 25.0000 ug | MEDICATED_PATCH | TRANSDERMAL | Status: DC

## 2014-01-23 MED ORDER — MELOXICAM 15 MG PO TABS: 15.0000 mg | ORAL_TABLET | Freq: Every day | ORAL | Status: DC

## 2014-01-23 NOTE — Telephone Encounter (Signed)
Medication refilled per protocol. 

## 2014-01-23 NOTE — Telephone Encounter (Signed)
Both meds printed ready for signature and pt to pick up, I looked to see if the ones that were refilled on the 01/18/14 and no sign of them being printed.

## 2014-02-18 ENCOUNTER — Other Ambulatory Visit: Payer: Self-pay | Admitting: Family Medicine

## 2014-02-18 ENCOUNTER — Telehealth: Payer: Self-pay | Admitting: Family Medicine

## 2014-02-18 MED ORDER — FENTANYL 25 MCG/HR TD PT72: 25.0000 ug | MEDICATED_PATCH | TRANSDERMAL | Status: DC

## 2014-02-18 NOTE — Telephone Encounter (Signed)
Call back number is [phone] Pt is needing a refill on his pain patch

## 2014-02-18 NOTE — Telephone Encounter (Signed)
Prescription printed and patient made aware to come to office to pick up.  

## 2014-02-18 NOTE — Telephone Encounter (Signed)
Okay to refill? 

## 2014-02-18 NOTE — Telephone Encounter (Signed)
Ok to refill?  Last office visit 11/23/2013.  Last refill 01/23/2014.

## 2014-02-19 NOTE — Telephone Encounter (Signed)
Refill appropriate and filled per protocol. 

## 2014-03-20 ENCOUNTER — Telehealth: Payer: Self-pay | Admitting: *Deleted

## 2014-03-20 MED ORDER — FENTANYL 25 MCG/HR TD PT72: 25.0000 ug | MEDICATED_PATCH | TRANSDERMAL | Status: DC

## 2014-03-20 NOTE — Telephone Encounter (Signed)
Med refilled.

## 2014-03-20 NOTE — Telephone Encounter (Signed)
Ok to refill??  Last office visit 11/23/2013.  Last refill 02/18/2014.

## 2014-03-20 NOTE — Telephone Encounter (Signed)
Needs refill on Duragesic patch 25mg .   Pharmacy [facility]

## 2014-03-22 NOTE — Telephone Encounter (Signed)
Prescription printed and patient made aware to come to office to pick up.  

## 2014-03-25 ENCOUNTER — Ambulatory Visit (INDEPENDENT_AMBULATORY_CARE_PROVIDER_SITE_OTHER): Payer: 59 | Admitting: Family Medicine

## 2014-03-25 ENCOUNTER — Encounter: Payer: Self-pay | Admitting: Family Medicine

## 2014-03-25 VITALS — BP 126/84 | HR 62 | Temp 98.1°F | Resp 12 | Ht 69.5 in | Wt 155.0 lb

## 2014-03-25 DIAGNOSIS — Z125 Encounter for screening for malignant neoplasm of prostate: Secondary | ICD-10-CM

## 2014-03-25 DIAGNOSIS — R911 Solitary pulmonary nodule: Secondary | ICD-10-CM

## 2014-03-25 DIAGNOSIS — I10 Essential (primary) hypertension: Secondary | ICD-10-CM

## 2014-03-25 LAB — CBC WITH DIFFERENTIAL/PLATELET
BASOS PCT: 1 % (ref 0–1)
Basophils Absolute: 0 10*3/uL (ref 0.0–0.1)
EOS ABS: 0.5 10*3/uL (ref 0.0–0.7)
EOS PCT: 11 % — AB (ref 0–5)
HCT: 39.2 % (ref 39.0–52.0)
HEMOGLOBIN: 13.3 g/dL (ref 13.0–17.0)
LYMPHS ABS: 1.1 10*3/uL (ref 0.7–4.0)
Lymphocytes Relative: 25 % (ref 12–46)
MCH: 31.5 pg (ref 26.0–34.0)
MCHC: 33.9 g/dL (ref 30.0–36.0)
MCV: 92.9 fL (ref 78.0–100.0)
MONO ABS: 0.7 10*3/uL (ref 0.1–1.0)
MONOS PCT: 15 % — AB (ref 3–12)
Neutro Abs: 2.1 10*3/uL (ref 1.7–7.7)
Neutrophils Relative %: 48 % (ref 43–77)
Platelets: 356 10*3/uL (ref 150–400)
RBC: 4.22 MIL/uL (ref 4.22–5.81)
RDW: 13.7 % (ref 11.5–15.5)
WBC: 4.4 10*3/uL (ref 4.0–10.5)

## 2014-03-25 LAB — COMPREHENSIVE METABOLIC PANEL
ALK PHOS: 119 U/L — AB (ref 39–117)
ALT: 26 U/L (ref 0–53)
AST: 29 U/L (ref 0–37)
Albumin: 4.2 g/dL (ref 3.5–5.2)
BILIRUBIN TOTAL: 0.7 mg/dL (ref 0.2–1.2)
BUN: 13 mg/dL (ref 6–23)
CO2: 29 meq/L (ref 19–32)
CREATININE: 1.14 mg/dL (ref 0.50–1.35)
Calcium: 9.5 mg/dL (ref 8.4–10.5)
Chloride: 105 mEq/L (ref 96–112)
GLUCOSE: 68 mg/dL — AB (ref 70–99)
Potassium: 4.4 mEq/L (ref 3.5–5.3)
Sodium: 142 mEq/L (ref 135–145)
TOTAL PROTEIN: 7.2 g/dL (ref 6.0–8.3)

## 2014-03-25 LAB — LIPID PANEL
CHOLESTEROL: 199 mg/dL (ref 0–200)
HDL: 96 mg/dL (ref >39)
LDL Cholesterol: 87 mg/dL (ref 0–99)
TRIGLYCERIDES: 79 mg/dL (ref <150)
Total CHOL/HDL Ratio: 2.1 Ratio
VLDL: 16 mg/dL (ref 0–40)

## 2014-03-25 NOTE — Patient Instructions (Signed)
Continue current medications We will send letter with normal results CT scan of chest to be done to recheck lung nodule F/U 4 months

## 2014-03-25 NOTE — Assessment & Plan Note (Signed)
Obtain CT of chest without contrast to followup lung nodule

## 2014-03-25 NOTE — Assessment & Plan Note (Signed)
Blood pressure is well-controlled continue current medications fasting labs done including lipid panel

## 2014-03-25 NOTE — Progress Notes (Signed)
Patient ID: Johnathan Baldwin, male   DOB: January 05, 1956, 58 y.o.   MRN: [id number]   Subjective:    Patient ID: Johnathan Edisonay C Baldwin, male    DOB: January 05, 1956, 58 y.o.   MRN: [id number]  Patient presents for 4 month F/U  patient here to followup chronic medical problems he has no specific concerns today. Medications were reviewed. He is due for fasting labs. His history of a benign lung nodule he is due for repeat CT scan of chest. He continues to use that now for his chronic abdominal pain.  Discussed prostate cancer screening he would like to have this done his blood work  Review Of Systems:  GEN- denies fatigue, fever, weight loss,weakness, recent illness HEENT- denies eye drainage, change in vision, nasal discharge, CVS- denies chest pain, palpitations RESP- denies SOB, cough, wheeze ABD- denies N/V, change in stools, abd pain GU- denies dysuria, hematuria, dribbling, incontinence MSK- denies joint pain, muscle aches, injury Neuro- denies headache, dizziness, syncope, seizure activity       Objective:    BP 126/84  Pulse 62  Temp(Src) 98.1 F (36.7 C) (Oral)  Resp 12  Ht 5' 9.5" (1.765 m)  Wt 155 lb (70.308 kg)  BMI 22.57 kg/m2 GEN- NAD, alert and oriented x3 HEENT- PERRL, EOMI, non injected sclera, pink conjunctiva, MMM, oropharynx clear CVS- RRR, no murmur RESP-CTAB EXT- No edema Pulses- Radial 2+        Assessment & Plan:      Problem List Items Addressed This Visit   HTN (hypertension), benign - Primary   Relevant Orders      CBC with Differential      Comprehensive metabolic panel      Lipid panel    Other Visit Diagnoses   Prostate cancer screening        Relevant Orders       PSA       Note: This dictation was prepared with Dragon dictation along with smaller phrase technology. Any transcriptional errors that result from this process are unintentional.

## 2014-03-26 ENCOUNTER — Other Ambulatory Visit: Payer: Self-pay | Admitting: *Deleted

## 2014-03-26 LAB — PSA: PSA: 0.47 ng/mL (ref <=4.00)

## 2014-03-26 MED ORDER — MELOXICAM 15 MG PO TABS: 15.0000 mg | ORAL_TABLET | Freq: Every day | ORAL | Status: DC

## 2014-03-26 NOTE — Telephone Encounter (Signed)
Refill appropriate and filled per protocol. 

## 2014-03-27 ENCOUNTER — Encounter: Payer: Self-pay | Admitting: *Deleted

## 2014-03-28 ENCOUNTER — Ambulatory Visit (HOSPITAL_COMMUNITY)
Admission: RE | Admit: 2014-03-28 | Discharge: 2014-03-28 | Disposition: A | Discharge status: Home / Self Care | Payer: 59 | Source: Ambulatory Visit | Attending: Family Medicine | Admitting: Family Medicine

## 2014-03-28 DIAGNOSIS — N200 Calculus of kidney: Secondary | ICD-10-CM | POA: Insufficient documentation

## 2014-03-28 DIAGNOSIS — Z09 Encounter for follow-up examination after completed treatment for conditions other than malignant neoplasm: Secondary | ICD-10-CM | POA: Insufficient documentation

## 2014-03-28 DIAGNOSIS — R911 Solitary pulmonary nodule: Secondary | ICD-10-CM

## 2014-03-28 DIAGNOSIS — J984 Other disorders of lung: Secondary | ICD-10-CM | POA: Insufficient documentation

## 2014-03-28 DIAGNOSIS — K8020 Calculus of gallbladder without cholecystitis without obstruction: Secondary | ICD-10-CM | POA: Insufficient documentation

## 2014-03-28 DIAGNOSIS — R918 Other nonspecific abnormal finding of lung field: Secondary | ICD-10-CM | POA: Insufficient documentation

## 2014-04-18 ENCOUNTER — Telehealth: Payer: Self-pay | Admitting: Family Medicine

## 2014-04-18 NOTE — Telephone Encounter (Signed)
Ok to refill??  Last office visit 03/25/2014.  Last refill 03/20/2014.

## 2014-04-18 NOTE — Telephone Encounter (Signed)
[phone] Pt is needing a refill on his pain patch

## 2014-04-18 NOTE — Telephone Encounter (Signed)
Okay to refill? 

## 2014-04-19 MED ORDER — FENTANYL 25 MCG/HR TD PT72: 25.0000 ug | MEDICATED_PATCH | TRANSDERMAL | Status: DC

## 2014-04-19 NOTE — Telephone Encounter (Signed)
Prescription printed and patient made aware to come to office to pick up.  

## 2014-05-16 ENCOUNTER — Telehealth: Payer: Self-pay | Admitting: Family Medicine

## 2014-05-16 NOTE — Telephone Encounter (Signed)
okay

## 2014-05-16 NOTE — Telephone Encounter (Signed)
PT is needing a refill on his pain patch. He tried to tell me the name of it but I couldn't understand him Call back number is [phone]

## 2014-05-16 NOTE — Telephone Encounter (Signed)
Ok to refill Duragesic??  Last office visit 03/25/2014.  Last refill 04/19/2014.

## 2014-05-17 MED ORDER — FENTANYL 25 MCG/HR TD PT72: 25.0000 ug | MEDICATED_PATCH | TRANSDERMAL | Status: DC

## 2014-05-17 NOTE — Telephone Encounter (Signed)
Prescription printed and patient made aware to come to office to pick up.  

## 2014-06-03 ENCOUNTER — Other Ambulatory Visit: Payer: Self-pay | Admitting: Family Medicine

## 2014-06-12 ENCOUNTER — Telehealth: Payer: Self-pay | Admitting: Family Medicine

## 2014-06-12 MED ORDER — FENTANYL 25 MCG/HR TD PT72: 25.0000 ug | MEDICATED_PATCH | TRANSDERMAL | Status: DC

## 2014-06-12 NOTE — Telephone Encounter (Signed)
Prescription printed and patient made aware to come to office to pick up.  

## 2014-06-12 NOTE — Telephone Encounter (Signed)
Ok to refill??  Last office visit 03/25/2014.  Last refill 05/17/2014.

## 2014-06-12 NOTE — Telephone Encounter (Signed)
Patient is calling to get refill on his pain patch please call him when this is ready [phone]

## 2014-06-12 NOTE — Telephone Encounter (Signed)
okay

## 2014-06-17 ENCOUNTER — Other Ambulatory Visit: Payer: Self-pay | Admitting: Family Medicine

## 2014-06-17 NOTE — Telephone Encounter (Signed)
Refill appropriate and filled per protocol. 

## 2014-07-11 ENCOUNTER — Telehealth: Payer: Self-pay | Admitting: Family Medicine

## 2014-07-11 NOTE — Telephone Encounter (Signed)
[phone]  Pt is needing a refill on pain on patch

## 2014-07-11 NOTE — Telephone Encounter (Signed)
Ok to refill??  Last office visit 03/25/2014.  Last refill 06/12/2014.

## 2014-07-12 MED ORDER — FENTANYL 25 MCG/HR TD PT72: 25.0000 ug | MEDICATED_PATCH | TRANSDERMAL | Status: DC

## 2014-07-12 NOTE — Telephone Encounter (Signed)
okay

## 2014-07-12 NOTE — Telephone Encounter (Signed)
Prescription printed and patient made aware to come to office to pick up.  

## 2014-07-29 ENCOUNTER — Ambulatory Visit: Payer: Self-pay | Admitting: Family Medicine

## 2014-08-09 ENCOUNTER — Telehealth: Payer: Self-pay | Admitting: *Deleted

## 2014-08-09 MED ORDER — FENTANYL 25 MCG/HR TD PT72: 25.0000 ug | MEDICATED_PATCH | TRANSDERMAL | Status: DC

## 2014-08-09 NOTE — Telephone Encounter (Signed)
Call placed to patient to make aware that prescription for Fentanyl is due and can be picked up on 08/12/2014.  LMTRC.

## 2014-08-10 NOTE — Telephone Encounter (Signed)
Call placed to patient and patient made aware.  

## 2014-08-16 ENCOUNTER — Other Ambulatory Visit: Payer: Self-pay | Admitting: Family Medicine

## 2014-08-17 NOTE — Telephone Encounter (Signed)
Refill appropriate and filled per protocol. 

## 2014-09-09 ENCOUNTER — Telehealth: Payer: Self-pay | Admitting: Family Medicine

## 2014-09-09 MED ORDER — FENTANYL 25 MCG/HR TD PT72: 25.0000 ug | MEDICATED_PATCH | TRANSDERMAL | Status: DC

## 2014-09-09 NOTE — Telephone Encounter (Signed)
Ok to refill??  Last office visit 03/25/2014.  Last refill 08/09/2014.

## 2014-09-09 NOTE — Telephone Encounter (Signed)
Prescription printed and patient made aware to come to office to pick up.  

## 2014-09-09 NOTE — Telephone Encounter (Signed)
okay

## 2014-09-09 NOTE — Telephone Encounter (Signed)
[phone]  Pt is needing a refill on fentaNYL (DURAGESIC - DOSED MCG/HR) 25 MCG/HR patch

## 2014-09-13 ENCOUNTER — Other Ambulatory Visit: Payer: Self-pay | Admitting: Family Medicine

## 2014-09-13 NOTE — Telephone Encounter (Signed)
Refill appropriate and filled per protocol. 

## 2014-10-04 ENCOUNTER — Telehealth: Payer: Self-pay | Admitting: Family Medicine

## 2014-10-04 MED ORDER — FENTANYL 25 MCG/HR TD PT72: 25.0000 ug | MEDICATED_PATCH | TRANSDERMAL | Status: DC

## 2014-10-04 NOTE — Telephone Encounter (Signed)
Okay to refill? 

## 2014-10-04 NOTE — Telephone Encounter (Signed)
Prescription printed and patient made aware to come to office to pick up.  

## 2014-10-04 NOTE — Telephone Encounter (Signed)
[phone]  PT is needing a refill on fentaNYL (DURAGESIC - DOSED MCG/HR) 25 MCG/HR patch

## 2014-10-04 NOTE — Telephone Encounter (Signed)
Ok to refill??  Last office visit 03/25/2014- NS for 4 month F/U.  Last refill 09/09/2014

## 2014-10-21 ENCOUNTER — Other Ambulatory Visit: Payer: Self-pay | Admitting: Family Medicine

## 2014-10-22 ENCOUNTER — Encounter: Payer: Self-pay | Admitting: Family Medicine

## 2014-10-22 NOTE — Telephone Encounter (Signed)
Medication refill for one time only.  Patient needs to be seen.  Letter sent for patient to call and schedule 

## 2014-11-05 ENCOUNTER — Telehealth: Payer: Self-pay | Admitting: Family Medicine

## 2014-11-05 NOTE — Telephone Encounter (Signed)
Okay to refill? 

## 2014-11-05 NOTE — Telephone Encounter (Signed)
Ok to refill??  Last office visit 03/25/2014.  Last refill 10/04/2014.  Patient was supposed to F/U in August.   Patient has appointment for 11/08/2014.

## 2014-11-05 NOTE — Telephone Encounter (Signed)
Patient is calling to get rx for his pain patches  [phone]

## 2014-11-06 MED ORDER — FENTANYL 25 MCG/HR TD PT72: 25.0000 ug | MEDICATED_PATCH | TRANSDERMAL | Status: DC

## 2014-11-06 NOTE — Telephone Encounter (Signed)
Prescription printed and patient made aware to come to office to pick up per VM.  

## 2014-11-08 ENCOUNTER — Encounter: Payer: Self-pay | Admitting: Family Medicine

## 2014-11-08 ENCOUNTER — Ambulatory Visit (INDEPENDENT_AMBULATORY_CARE_PROVIDER_SITE_OTHER): Payer: 59 | Admitting: Family Medicine

## 2014-11-08 VITALS — BP 136/70 | HR 62 | Temp 97.4°F | Resp 14 | Ht 70.0 in | Wt 160.0 lb

## 2014-11-08 DIAGNOSIS — G8929 Other chronic pain: Secondary | ICD-10-CM

## 2014-11-08 DIAGNOSIS — I10 Essential (primary) hypertension: Secondary | ICD-10-CM

## 2014-11-08 DIAGNOSIS — R109 Unspecified abdominal pain: Secondary | ICD-10-CM

## 2014-11-08 DIAGNOSIS — M7989 Other specified soft tissue disorders: Secondary | ICD-10-CM

## 2014-11-08 LAB — CBC WITH DIFFERENTIAL/PLATELET
BASOS PCT: 0 % (ref 0–1)
Basophils Absolute: 0 10*3/uL (ref 0.0–0.1)
Eosinophils Absolute: 0.4 10*3/uL (ref 0.0–0.7)
Eosinophils Relative: 9 % — ABNORMAL HIGH (ref 0–5)
HEMATOCRIT: 38 % — AB (ref 39.0–52.0)
Hemoglobin: 12.7 g/dL — ABNORMAL LOW (ref 13.0–17.0)
Lymphocytes Relative: 28 % (ref 12–46)
Lymphs Abs: 1.2 10*3/uL (ref 0.7–4.0)
MCH: 31 pg (ref 26.0–34.0)
MCHC: 33.4 g/dL (ref 30.0–36.0)
MCV: 92.7 fL (ref 78.0–100.0)
MPV: 9.9 fL (ref 9.4–12.4)
Monocytes Absolute: 0.5 10*3/uL (ref 0.1–1.0)
Monocytes Relative: 12 % (ref 3–12)
NEUTROS ABS: 2.2 10*3/uL (ref 1.7–7.7)
NEUTROS PCT: 51 % (ref 43–77)
Platelets: 381 10*3/uL (ref 150–400)
RBC: 4.1 MIL/uL — AB (ref 4.22–5.81)
RDW: 13.3 % (ref 11.5–15.5)
WBC: 4.4 10*3/uL (ref 4.0–10.5)

## 2014-11-08 LAB — COMPREHENSIVE METABOLIC PANEL
ALK PHOS: 96 U/L (ref 39–117)
ALT: 19 U/L (ref 0–53)
AST: 29 U/L (ref 0–37)
Albumin: 4.4 g/dL (ref 3.5–5.2)
BUN: 12 mg/dL (ref 6–23)
CO2: 28 mEq/L (ref 19–32)
Calcium: 9.7 mg/dL (ref 8.4–10.5)
Chloride: 101 mEq/L (ref 96–112)
Creat: 1.11 mg/dL (ref 0.50–1.35)
Glucose, Bld: 96 mg/dL (ref 70–99)
Potassium: 3.9 mEq/L (ref 3.5–5.3)
SODIUM: 140 meq/L (ref 135–145)
Total Bilirubin: 0.7 mg/dL (ref 0.2–1.2)
Total Protein: 7.4 g/dL (ref 6.0–8.3)

## 2014-11-08 MED ORDER — AMLODIPINE BESYLATE 5 MG PO TABS: 5.0000 mg | ORAL_TABLET | Freq: Every day | ORAL | Status: DC

## 2014-11-08 MED ORDER — CLOTRIMAZOLE-BETAMETHASONE 1-0.05 % EX CREA: 1.0000 "application " | TOPICAL_CREAM | Freq: Two times a day (BID) | CUTANEOUS | Status: DC

## 2014-11-08 MED ORDER — FUROSEMIDE 20 MG PO TABS: 20.0000 mg | ORAL_TABLET | Freq: Every day | ORAL | Status: DC | PRN

## 2014-11-08 NOTE — Progress Notes (Signed)
Pt also noted during visit he gets a rash that is itchy after shaving at times, also flakly skin in beard   Will give lotrisone BID to neck as needed,   Mild erythema few tiny papular leions, no pustules in bearded region

## 2014-11-08 NOTE — Assessment & Plan Note (Signed)
Continue fentanyl patches

## 2014-11-08 NOTE — Progress Notes (Signed)
Patient ID: Johnathan Baldwin, male   DOB: 11-17-1956, 58 y.o.   MRN: [id number]   Subjective:    Patient ID: Johnathan Baldwin, male    DOB: 11-17-1956, 58 y.o.   MRN: [id number]  Patient presents for F/U and Edema patient here for follow-up chronic medical problems. He is available in control. He is actually 5 pounds. He does complain of swelling on and off since that time bringing up. It is using his ankles and right above. Denies any sneezing denies any paresthesias. There's been no significant change in salt intake. He has f/u with urology next week    Review Of Systems:  GEN- denies fatigue, fever, weight loss,weakness, recent illness HEENT- denies eye drainage, change in vision, nasal discharge, CVS- denies chest pain, palpitations RESP- denies SOB, cough, wheeze ABD- denies N/V, change in stools, abd pain GU- denies dysuria, hematuria, dribbling, incontinence MSK- denies joint pain, muscle aches, injury Neuro- denies headache, dizziness, syncope, seizure activity       Objective:    BP 136/70 mmHg  Pulse 62  Temp(Src) 97.4 F (36.3 C) (Oral)  Resp 14  Ht 5\' 10"  (1.778 m)  Wt 160 lb (72.576 kg)  BMI 22.96 kg/m2 GEN- NAD, alert and oriented x3 HEENT- PERRL, EOMI, non injected sclera, pink conjunctiva, MMM, oropharynx clear Neck- Supple, no JVD CVS- RRR, no murmur RESP-CTAB ABD-NABS,soft,NT,ND, ostomy in place EXT- pedal edema bilat L . R Pulses- Radial, DP- 2+        Assessment & Plan:      Problem List Items Addressed This Visit    Leg swelling   HTN (hypertension), benign   Relevant Medications      amLODIpine (NORVASC) tablet      furosemide (LASIX) tablet   Other Relevant Orders      CBC with Differential      Comprehensive metabolic panel   Chronic abdominal pain - Primary      Note: This dictation was prepared with Dragon dictation along with smaller phrase technology. Any transcriptional errors that result from this process are unintentional.

## 2014-11-08 NOTE — Assessment & Plan Note (Signed)
Well controlled, however with swelling try lower dose of norvasc

## 2014-11-08 NOTE — Patient Instructions (Signed)
Take lasix 1 tablet in morning for next 5 days, elevate your feet norvasc decreased to 5mg  at bedtime F/U 4 weeks

## 2014-11-08 NOTE — Addendum Note (Signed)
Addended by: Milinda AntisURHAM, Michaelyn Wall F on: 11/08/2014 05:12 PM   Modules accepted: Orders

## 2014-11-08 NOTE — Assessment & Plan Note (Signed)
Mild edema ? Related to norvasc, no sign of overt CHF, Decrease norvasc to 5mg  Check labs Lasix 20mg  x 5 days

## 2014-11-26 ENCOUNTER — Other Ambulatory Visit: Payer: Self-pay | Admitting: Family Medicine

## 2014-11-26 NOTE — Telephone Encounter (Signed)
Refill appropriate and filled per protocol. 

## 2014-12-03 ENCOUNTER — Telehealth: Payer: Self-pay | Admitting: Family Medicine

## 2014-12-03 MED ORDER — FENTANYL 25 MCG/HR TD PT72: 25.0000 ug | MEDICATED_PATCH | TRANSDERMAL | Status: DC

## 2014-12-03 NOTE — Telephone Encounter (Signed)
Prescription printed and patient made aware to come to office to pick up.  

## 2014-12-03 NOTE — Telephone Encounter (Signed)
Ok to refill??  Last office visit 11/08/2014.  Last refill 11/06/2014.

## 2014-12-03 NOTE — Telephone Encounter (Signed)
Patient is calling to get refill on his pain patch please call him at [phone] when ready

## 2014-12-03 NOTE — Telephone Encounter (Signed)
okay

## 2014-12-06 ENCOUNTER — Ambulatory Visit: Payer: 59 | Admitting: Family Medicine

## 2014-12-19 ENCOUNTER — Other Ambulatory Visit: Payer: Self-pay | Admitting: *Deleted

## 2014-12-19 MED ORDER — RANITIDINE HCL 15 MG/ML PO SYRP: ORAL_SOLUTION | ORAL | Status: DC

## 2014-12-19 MED ORDER — CLOTRIMAZOLE-BETAMETHASONE 1-0.05 % EX CREA: 1.0000 "application " | TOPICAL_CREAM | Freq: Two times a day (BID) | CUTANEOUS | Status: DC

## 2014-12-19 MED ORDER — AMLODIPINE BESYLATE 5 MG PO TABS: 5.0000 mg | ORAL_TABLET | Freq: Every day | ORAL | Status: DC

## 2014-12-19 MED ORDER — MELOXICAM 15 MG PO TABS: 15.0000 mg | ORAL_TABLET | Freq: Every day | ORAL | Status: DC

## 2014-12-19 NOTE — Telephone Encounter (Signed)
Received fax requesting refills on Mobic, Zantac, Norvasc and Lotrisone with 90 day supply.   Refill appropriate and filled per protocol.

## 2014-12-31 ENCOUNTER — Telehealth: Payer: Self-pay | Admitting: Family Medicine

## 2014-12-31 MED ORDER — FENTANYL 25 MCG/HR TD PT72: 25.0000 ug | MEDICATED_PATCH | TRANSDERMAL | Status: DC

## 2014-12-31 NOTE — Telephone Encounter (Signed)
Okay to refill? 

## 2014-12-31 NOTE — Telephone Encounter (Signed)
Prescription printed and patient made aware to come to office to pick up.  

## 2014-12-31 NOTE — Telephone Encounter (Signed)
[phone] Pt is needing a refill on fentaNYL (DURAGESIC - DOSED MCG/HR) 25 MCG/HR patch

## 2014-12-31 NOTE — Telephone Encounter (Signed)
Ok to refill??  Last office visit 11/08/2014.  Last refill 12/03/2014.

## 2015-01-28 ENCOUNTER — Telehealth: Payer: Self-pay | Admitting: Family Medicine

## 2015-01-28 MED ORDER — FENTANYL 25 MCG/HR TD PT72: 25.0000 ug | MEDICATED_PATCH | TRANSDERMAL | Status: DC

## 2015-01-28 NOTE — Telephone Encounter (Signed)
Prescription printed and patient made aware to come to office to pick up.  

## 2015-01-28 NOTE — Telephone Encounter (Signed)
Patient needs refill on pain patch  [phone]

## 2015-01-28 NOTE — Telephone Encounter (Signed)
Okay to refill? 

## 2015-01-28 NOTE — Telephone Encounter (Signed)
Ok to refill??  Last office visit 11/08/2014.  Last refill 12/31/2014.

## 2015-02-25 ENCOUNTER — Telehealth: Payer: Self-pay | Admitting: *Deleted

## 2015-02-25 MED ORDER — FENTANYL 25 MCG/HR TD PT72: 25.0000 ug | MEDICATED_PATCH | TRANSDERMAL | Status: DC

## 2015-02-25 NOTE — Telephone Encounter (Signed)
Med printed ready for provider signature 

## 2015-02-25 NOTE — Telephone Encounter (Signed)
Okay to refill? 

## 2015-02-25 NOTE — Telephone Encounter (Signed)
Pt called stating needing refill on his Fentanyl patch  Last refill 01/28/15

## 2015-02-26 ENCOUNTER — Encounter: Payer: Self-pay | Admitting: *Deleted

## 2015-02-26 MED ORDER — FENTANYL 25 MCG/HR TD PT72: 25.0000 ug | MEDICATED_PATCH | TRANSDERMAL | Status: DC

## 2015-02-26 NOTE — Telephone Encounter (Signed)
This encounter was created in error - please disregard.

## 2015-02-26 NOTE — Telephone Encounter (Signed)
Prescription printed and patient made aware to come to office to pick up.  

## 2015-02-26 NOTE — Addendum Note (Signed)
Addended by: Phillips OdorSIX, Tayelor Osborne H on: 02/26/2015 05:02 PM   Modules accepted: Orders

## 2015-03-21 ENCOUNTER — Telehealth: Payer: Self-pay | Admitting: Family Medicine

## 2015-03-21 MED ORDER — RANITIDINE HCL 15 MG/ML PO SYRP: ORAL_SOLUTION | ORAL | Status: DC

## 2015-03-21 MED ORDER — FENTANYL 25 MCG/HR TD PT72: 25.0000 ug | MEDICATED_PATCH | TRANSDERMAL | Status: DC

## 2015-03-21 NOTE — Telephone Encounter (Signed)
okay

## 2015-03-21 NOTE — Telephone Encounter (Signed)
Ok to refill Duragesic??  Last office visit 11/08/2014.  Last refill 02/26/2015.

## 2015-03-21 NOTE — Telephone Encounter (Signed)
Prescription printed and patient made aware to come to office to pick up.   Zantac sent to pharmacy.

## 2015-03-21 NOTE — Telephone Encounter (Signed)
[phone] PT is needing a refill on fentaNYL (DURAGESIC - DOSED MCG/HR) 25 MCG/HR patch PT is also needing ranitidine (ZANTAC) 15 MG/ML syrup [facility]

## 2015-04-18 ENCOUNTER — Telehealth: Payer: Self-pay | Admitting: Family Medicine

## 2015-04-18 MED ORDER — FENTANYL 25 MCG/HR TD PT72: 25.0000 ug | MEDICATED_PATCH | TRANSDERMAL | Status: DC

## 2015-04-18 MED ORDER — AMLODIPINE BESYLATE 5 MG PO TABS: 5.0000 mg | ORAL_TABLET | Freq: Every day | ORAL | Status: DC

## 2015-04-18 NOTE — Telephone Encounter (Signed)
Prescription printed and patient made aware to come to office to pick up.  

## 2015-04-18 NOTE — Telephone Encounter (Signed)
Ok to refill??  Last office visit 11/09/2015.  Last refill 03/21/2015.

## 2015-04-18 NOTE — Telephone Encounter (Signed)
okay

## 2015-04-18 NOTE — Telephone Encounter (Signed)
Patient calling for refill on his pain patch and also blood pressure medication to be called into [facility] and when pain patch rx ready to pick up please call him at [phone]

## 2015-05-20 ENCOUNTER — Telehealth: Payer: Self-pay | Admitting: Family Medicine

## 2015-05-20 MED ORDER — FENTANYL 25 MCG/HR TD PT72: 25.0000 ug | MEDICATED_PATCH | TRANSDERMAL | Status: DC

## 2015-05-20 MED ORDER — MELOXICAM 15 MG PO TABS: 15.0000 mg | ORAL_TABLET | Freq: Every day | ORAL | Status: DC

## 2015-05-20 NOTE — Telephone Encounter (Signed)
Okay to refill? 

## 2015-05-20 NOTE — Telephone Encounter (Signed)
Call placed to patient and patient made aware.  

## 2015-05-20 NOTE — Telephone Encounter (Signed)
Patient needs refill on fentanyl and meloxicam  If possible  [phone]

## 2015-05-20 NOTE — Telephone Encounter (Signed)
Ok to refill??  Last office visit 11/08/2014 and was to F/U in 4 weeks.   Last refill 04/18/2015.

## 2015-06-04 ENCOUNTER — Ambulatory Visit (INDEPENDENT_AMBULATORY_CARE_PROVIDER_SITE_OTHER): Payer: 59 | Admitting: Family Medicine

## 2015-06-04 ENCOUNTER — Encounter: Payer: Self-pay | Admitting: Family Medicine

## 2015-06-04 VITALS — BP 134/68 | HR 70 | Temp 98.1°F | Resp 14 | Ht 70.0 in | Wt 151.0 lb

## 2015-06-04 DIAGNOSIS — I10 Essential (primary) hypertension: Secondary | ICD-10-CM | POA: Diagnosis not present

## 2015-06-04 DIAGNOSIS — R109 Unspecified abdominal pain: Secondary | ICD-10-CM

## 2015-06-04 DIAGNOSIS — G8929 Other chronic pain: Secondary | ICD-10-CM | POA: Diagnosis not present

## 2015-06-04 LAB — COMPREHENSIVE METABOLIC PANEL
ALBUMIN: 4.2 g/dL (ref 3.5–5.2)
ALT: 13 U/L (ref 0–53)
AST: 21 U/L (ref 0–37)
Alkaline Phosphatase: 91 U/L (ref 39–117)
BUN: 18 mg/dL (ref 6–23)
CALCIUM: 9.2 mg/dL (ref 8.4–10.5)
CHLORIDE: 103 meq/L (ref 96–112)
CO2: 27 mEq/L (ref 19–32)
Creat: 1.22 mg/dL (ref 0.50–1.35)
Glucose, Bld: 90 mg/dL (ref 70–99)
Potassium: 4.1 mEq/L (ref 3.5–5.3)
SODIUM: 136 meq/L (ref 135–145)
TOTAL PROTEIN: 7.3 g/dL (ref 6.0–8.3)
Total Bilirubin: 0.7 mg/dL (ref 0.2–1.2)

## 2015-06-04 LAB — CBC WITH DIFFERENTIAL/PLATELET
BASOS ABS: 0 10*3/uL (ref 0.0–0.1)
Basophils Relative: 1 % (ref 0–1)
EOS ABS: 0.4 10*3/uL (ref 0.0–0.7)
EOS PCT: 8 % — AB (ref 0–5)
HCT: 39.4 % (ref 39.0–52.0)
Hemoglobin: 12.9 g/dL — ABNORMAL LOW (ref 13.0–17.0)
LYMPHS PCT: 30 % (ref 12–46)
Lymphs Abs: 1.4 10*3/uL (ref 0.7–4.0)
MCH: 30.9 pg (ref 26.0–34.0)
MCHC: 32.7 g/dL (ref 30.0–36.0)
MCV: 94.3 fL (ref 78.0–100.0)
MPV: 10 fL (ref 8.6–12.4)
Monocytes Absolute: 0.5 10*3/uL (ref 0.1–1.0)
Monocytes Relative: 12 % (ref 3–12)
Neutro Abs: 2.2 10*3/uL (ref 1.7–7.7)
Neutrophils Relative %: 49 % (ref 43–77)
PLATELETS: 377 10*3/uL (ref 150–400)
RBC: 4.18 MIL/uL — ABNORMAL LOW (ref 4.22–5.81)
RDW: 13.6 % (ref 11.5–15.5)
WBC: 4.5 10*3/uL (ref 4.0–10.5)

## 2015-06-04 LAB — LIPID PANEL
Cholesterol: 188 mg/dL (ref 0–200)
HDL: 117 mg/dL (ref >=40)
LDL Cholesterol: 55 mg/dL (ref 0–99)
Total CHOL/HDL Ratio: 1.6 Ratio
Triglycerides: 79 mg/dL (ref <150)
VLDL: 16 mg/dL (ref 0–40)

## 2015-06-04 MED ORDER — FENTANYL 25 MCG/HR TD PT72: 25.0000 ug | MEDICATED_PATCH | TRANSDERMAL | Status: DC

## 2015-06-04 NOTE — Assessment & Plan Note (Signed)
Well controlled, no change to medication Fasting labs today

## 2015-06-04 NOTE — Assessment & Plan Note (Signed)
Continue current pain regimen Script given for June Continue zantac

## 2015-06-04 NOTE — Patient Instructions (Signed)
Continue current medications We will call with lab results F/U 6 Months- FOR PHYSICAL

## 2015-06-04 NOTE — Progress Notes (Signed)
Patient ID: [name], male   DOB: 04/02/1956, 59 y.o.   MRN: [id number]   Subjective:    Patient ID: Johnathan Baldwin, male    DOB: 08-20-1956, 59 y.o.   MRN: [id number]  Patient presents for 6 month F/U  Pt here to f/u chronic medical problems, no concerns today. Pain medications work well for him. No difficulties with his colostomy bag, continues to take zantac.  HTN- taking BP meds without any difficulty, has not required lasix in > 6 months.  Does not take meloxicam on regular basis   Has been working outside a lot, weight down 9lbs   Review Of Systems:  GEN- denies fatigue, fever, weight loss,weakness, recent illness HEENT- denies eye drainage, change in vision, nasal discharge, CVS- denies chest pain, palpitations RESP- denies SOB, cough, wheeze ABD- denies N/V, change in stools, +abd pain GU- denies dysuria, hematuria, dribbling, incontinence MSK- denies joint pain, muscle aches, injury Neuro- denies headache, dizziness, syncope, seizure activity       Objective:    BP 134/68 mmHg  Pulse 70  Temp(Src) 98.1 F (36.7 C) (Oral)  Resp 14  Ht 5\' 10"  (1.778 m)  Wt 151 lb (68.493 kg)  BMI 21.67 kg/m2 GEN- NAD, alert and oriented x3 HEENT- PERRL, EOMI, non injected sclera, pink conjunctiva, MMM, oropharynx clear Neck- Supple, no thyromegaly CVS- RRR, no murmur RESP-CTAB EXT- No edema Pulses- Radial, DP- 2+        Assessment & Plan:      Problem List Items Addressed This Visit    None      Note: This dictation was prepared with Dragon dictation along with smaller phrase technology. Any transcriptional errors that result from this process are unintentional.

## 2015-06-05 ENCOUNTER — Encounter: Payer: Self-pay | Admitting: *Deleted

## 2015-06-24 ENCOUNTER — Telehealth: Payer: Self-pay | Admitting: *Deleted

## 2015-06-24 NOTE — Telephone Encounter (Signed)
okay

## 2015-06-24 NOTE — Telephone Encounter (Signed)
Noted prescription for Fentanyl due on 07/04/2015.   Ok to refill??  Last office visit 06/03/2014.

## 2015-06-25 MED ORDER — FENTANYL 25 MCG/HR TD PT72
25.0000 ug | MEDICATED_PATCH | TRANSDERMAL | Status: DC
Start: 2015-06-25 — End: 2015-08-12

## 2015-06-25 NOTE — Telephone Encounter (Signed)
Prescription printed and patient made aware to come to office to pick up.  

## 2015-08-11 ENCOUNTER — Telehealth: Payer: Self-pay | Admitting: Family Medicine

## 2015-08-11 NOTE — Telephone Encounter (Signed)
Ok to refill??  Last office visit 06/04/2015.  Last refill 07/03/2015.

## 2015-08-11 NOTE — Telephone Encounter (Signed)
Patient calling to get rx for his pain patch  Fentanyl  [phone] when ready

## 2015-08-12 MED ORDER — FENTANYL 25 MCG/HR TD PT72: 25.0000 ug | MEDICATED_PATCH | TRANSDERMAL | Status: DC

## 2015-08-12 NOTE — Telephone Encounter (Signed)
Prescription printed and patient made aware to come to office to pick up on 08/13/2015 per VM.

## 2015-08-12 NOTE — Telephone Encounter (Signed)
Okay to refill? 

## 2015-09-02 ENCOUNTER — Telehealth: Payer: Self-pay | Admitting: *Deleted

## 2015-09-02 MED ORDER — FENTANYL 25 MCG/HR TD PT72
25.0000 ug | MEDICATED_PATCH | TRANSDERMAL | Status: DC
Start: 2015-09-02 — End: 2015-10-07

## 2015-09-02 NOTE — Telephone Encounter (Signed)
Prescription printed and patient made aware to come to office to pick up on 09/05/2015. 

## 2015-09-02 NOTE — Telephone Encounter (Signed)
Noted prescription for Duragesic is due.   Ok to refill??  Last office visit 06/04/2015.  Last refill 08/12/2015.

## 2015-09-02 NOTE — Telephone Encounter (Signed)
Okay to refill? 

## 2015-10-07 ENCOUNTER — Telehealth: Payer: Self-pay | Admitting: *Deleted

## 2015-10-07 MED ORDER — FENTANYL 25 MCG/HR TD PT72: 25.0000 ug | MEDICATED_PATCH | TRANSDERMAL | Status: DC

## 2015-10-07 NOTE — Telephone Encounter (Signed)
Pt called needs refill on Fentanyl Patch 25 mcg  [facility]  Last rf 09/02/15  Last ov 06/04/15  Call back number [phone]

## 2015-10-07 NOTE — Telephone Encounter (Signed)
Okay to refill? 

## 2015-10-07 NOTE — Telephone Encounter (Signed)
To MD

## 2015-10-07 NOTE — Telephone Encounter (Signed)
Prescription printed and patient made aware to come to office to pick up on 10/08/2015.

## 2015-11-05 ENCOUNTER — Telehealth: Payer: Self-pay | Admitting: Family Medicine

## 2015-11-05 MED ORDER — FENTANYL 25 MCG/HR TD PT72: 25.0000 ug | MEDICATED_PATCH | TRANSDERMAL | Status: DC

## 2015-11-05 NOTE — Telephone Encounter (Signed)
okay

## 2015-11-05 NOTE — Telephone Encounter (Signed)
Pt called requesting his pain patch. Please call when ready to pick up. [phone]

## 2015-11-05 NOTE — Telephone Encounter (Signed)
Ok to refill??  Last office visit 06/04/2015.  Last refill 10/07/2015.

## 2015-11-05 NOTE — Telephone Encounter (Signed)
Prescription printed and patient made aware to come to office to pick up after 2pm on 11/05/2015 per VM.

## 2015-11-05 NOTE — Telephone Encounter (Signed)
Pt called for a refill of

## 2015-12-01 ENCOUNTER — Telehealth: Payer: Self-pay | Admitting: Family Medicine

## 2015-12-01 MED ORDER — FENTANYL 25 MCG/HR TD PT72: 25.0000 ug | MEDICATED_PATCH | TRANSDERMAL | Status: DC

## 2015-12-01 NOTE — Telephone Encounter (Signed)
Ok to refill??  Last office visit 06/04/2015.  Last refill 11/05/2015.

## 2015-12-01 NOTE — Telephone Encounter (Signed)
Okay to refill? 

## 2015-12-01 NOTE — Telephone Encounter (Signed)
Prescription printed and patient made aware to come to office to pick up after 4 pm on 12/01/2015. 

## 2015-12-01 NOTE — Telephone Encounter (Signed)
fentaNYL (DURAGESIC - DOSED MCG/HR) 25 MCG/HR Calling for refill request [phone]

## 2015-12-08 ENCOUNTER — Encounter: Payer: Self-pay | Admitting: Family Medicine

## 2015-12-08 ENCOUNTER — Ambulatory Visit (INDEPENDENT_AMBULATORY_CARE_PROVIDER_SITE_OTHER): Payer: Commercial Managed Care - HMO | Admitting: Family Medicine

## 2015-12-08 VITALS — BP 136/82 | HR 78 | Temp 97.8°F | Resp 18 | Wt 155.0 lb

## 2015-12-08 DIAGNOSIS — G8929 Other chronic pain: Secondary | ICD-10-CM

## 2015-12-08 DIAGNOSIS — M25519 Pain in unspecified shoulder: Secondary | ICD-10-CM

## 2015-12-08 DIAGNOSIS — M25511 Pain in right shoulder: Secondary | ICD-10-CM | POA: Diagnosis not present

## 2015-12-08 DIAGNOSIS — Z0000 Encounter for general adult medical examination without abnormal findings: Secondary | ICD-10-CM | POA: Diagnosis not present

## 2015-12-08 DIAGNOSIS — R109 Unspecified abdominal pain: Secondary | ICD-10-CM

## 2015-12-08 DIAGNOSIS — I10 Essential (primary) hypertension: Secondary | ICD-10-CM | POA: Diagnosis not present

## 2015-12-08 DIAGNOSIS — Z125 Encounter for screening for malignant neoplasm of prostate: Secondary | ICD-10-CM | POA: Diagnosis not present

## 2015-12-08 LAB — CBC WITH DIFFERENTIAL/PLATELET
BASOS ABS: 0.1 10*3/uL (ref 0.0–0.1)
BASOS PCT: 1 % (ref 0–1)
EOS ABS: 0.4 10*3/uL (ref 0.0–0.7)
EOS PCT: 8 % — AB (ref 0–5)
HCT: 40.1 % (ref 39.0–52.0)
Hemoglobin: 13.4 g/dL (ref 13.0–17.0)
LYMPHS ABS: 1.4 10*3/uL (ref 0.7–4.0)
Lymphocytes Relative: 25 % (ref 12–46)
MCH: 31.4 pg (ref 26.0–34.0)
MCHC: 33.4 g/dL (ref 30.0–36.0)
MCV: 93.9 fL (ref 78.0–100.0)
MPV: 10.2 fL (ref 8.6–12.4)
Monocytes Absolute: 0.5 10*3/uL (ref 0.1–1.0)
Monocytes Relative: 9 % (ref 3–12)
NEUTROS PCT: 57 % (ref 43–77)
Neutro Abs: 3.1 10*3/uL (ref 1.7–7.7)
PLATELETS: 358 10*3/uL (ref 150–400)
RBC: 4.27 MIL/uL (ref 4.22–5.81)
RDW: 13.5 % (ref 11.5–15.5)
WBC: 5.5 10*3/uL (ref 4.0–10.5)

## 2015-12-08 LAB — LIPID PANEL
CHOL/HDL RATIO: 1.5 ratio (ref <=5.0)
Cholesterol: 199 mg/dL (ref 125–200)
HDL: 132 mg/dL (ref >=40)
LDL Cholesterol: 54 mg/dL (ref <130)
Triglycerides: 63 mg/dL (ref <150)
VLDL: 13 mg/dL (ref <30)

## 2015-12-08 LAB — COMPREHENSIVE METABOLIC PANEL
ALT: 27 U/L (ref 9–46)
AST: 33 U/L (ref 10–35)
Albumin: 4.2 g/dL (ref 3.6–5.1)
Alkaline Phosphatase: 106 U/L (ref 40–115)
BUN: 13 mg/dL (ref 7–25)
CO2: 28 mmol/L (ref 20–31)
Calcium: 9 mg/dL (ref 8.6–10.3)
Chloride: 104 mmol/L (ref 98–110)
Creat: 1.12 mg/dL (ref 0.70–1.33)
GLUCOSE: 96 mg/dL (ref 70–99)
POTASSIUM: 4.5 mmol/L (ref 3.5–5.3)
Sodium: 140 mmol/L (ref 135–146)
TOTAL PROTEIN: 7.7 g/dL (ref 6.1–8.1)
Total Bilirubin: 0.6 mg/dL (ref 0.2–1.2)

## 2015-12-08 MED ORDER — FENTANYL 25 MCG/HR TD PT72: 25.0000 ug | MEDICATED_PATCH | TRANSDERMAL | Status: DC

## 2015-12-08 NOTE — Assessment & Plan Note (Signed)
The pressure is well controlled no change in medication will check fasting labs today

## 2015-12-08 NOTE — Assessment & Plan Note (Addendum)
Continue on Zantac as well as his fentanyl patches. His pain is well-controlled with these. Given script for Jan on Fentanyl patch

## 2015-12-08 NOTE — Assessment & Plan Note (Signed)
likley has some OA, FROM today on exam, declines orthopedics evaluation, discussed if this interferes with regular activity then we will revisit this For now Mobic as needed, takes a couple times a month, uses topicals

## 2015-12-08 NOTE — Patient Instructions (Signed)
I recommend eye visit once a year I recommend dental visit every 6 months Goal is to  Exercise 30 minutes 5 days a week We will send a letter with lab results  F/U 6 months  

## 2015-12-08 NOTE — Progress Notes (Signed)
Patient ID: Johnathan Baldwin, male   DOB: 07-05-1956, 59 y.o.   MRN: [id number] Subjective:   Patient presents for Medicare Annual/Subsequent preventive examination.   Pt here for annual wellness, Has right shoulder pain on and off, past few months, will take Mobic or use [facility] which helps. Does not want to go to Ortho, still able to lift weights and do regular activites without any pain.  Medications reviewed  Review Past Medical/Family/Social: Per EMR   Risk Factors  Current exercise habits: Gym Dietary issues discussed: None  Cardiac risk factors: HTN  Depression Screen  (Note: if answer to either of the following is "Yes", a more complete depression screening is indicated)  Over the past two weeks, have you felt down, depressed or hopeless? No Over the past two weeks, have you felt little interest or pleasure in doing things? No Have you lost interest or pleasure in daily life? No Do you often feel hopeless? No Do you cry easily over simple problems? No   Activities of Daily Living  In your present state of health, do you have any difficulty performing the following activities?:  Driving? No  Managing money? No  Feeding yourself? No  Getting from bed to chair? No  Climbing a flight of stairs? No  Preparing food and eating?: No  Bathing or showering? No  Getting dressed: No  Getting to the toilet? No  Using the toilet:No  Moving around from place to place: No  In the past year have you fallen or had a near fall?:No  Are you sexually active?Yes   Do you have more than one partner? No   Hearing Difficulties: No  Do you often ask people to speak up or repeat themselves? No  Do you experience ringing or noises in your ears? No Do you have difficulty understanding soft or whispered voices? No  Do you feel that you have a problem with memory? No Do you often misplace items? No  Do you feel safe at home? Yes  Cognitive Testing  Alert? Yes Normal Appearance?Yes  Oriented  to person? Yes Place? Yes  Time? Yes  Recall of three objects? Yes  Can perform simple calculations? Yes  Displays appropriate judgment?Yes  Can read the correct time from a watch face?Yes   List the Names of Other Physician/Practitioners you currently use:  None  Screening Tests / Date Colonoscopy     UTD                Zostavax Age 68 Influenza Vaccine  - Declines  Tetanus/tdap UTD  ROS: GEN- denies fatigue, fever, weight loss,weakness, recent illness HEENT- denies eye drainage, change in vision, nasal discharge, CVS- denies chest pain, palpitations RESP- denies SOB, cough, wheeze ABD- denies N/V, change in stools, abd pain GU- denies dysuria, hematuria, dribbling, incontinence MSK- + joint pain, muscle aches, injury Neuro- denies headache, dizziness, syncope, seizure activity  PHYSICAL: GEN- NAD, alert and oriented x3 HEENT- PERRL, EOMI, non injected sclera, pink conjunctiva, MMM, oropharynx clear Neck- Supple, no thryomegaly , FROM CVS- RRR, no murmur RESP-CTAB ABD-NABS,soft,NT,ND,Colostomy Bag in tact MSK- FROM upper ext, neg empty can, neg neers, biceps in tact  EXT- No edema Pulses- Radial, DP- 2+    Assessment:    Annual wellness medicare exam   Plan:    During the course of the visit the patient was educated and counseled about appropriate screening and preventive services including:  Declines flu shot   Screen NEG for depression. Diet review  for nutrition referral? Yes ____ Not Indicated __x__  Patient Instructions (the written plan) was given to the patient.  Medicare Attestation  I have personally reviewed:  The patient's medical and social history  Their use of alcohol, tobacco or illicit drugs  Their current medications and supplements  The patient's functional ability including ADLs,fall risks, home safety risks, cognitive, and hearing and visual impairment  Diet and physical activities  Evidence for depression or mood disorders  The patient's  weight, height, BMI, and visual acuity have been recorded in the chart. I have made referrals, counseling, and provided education to the patient based on review of the above and I have provided the patient with a written personalized care plan for preventive services.

## 2015-12-09 ENCOUNTER — Encounter: Payer: Self-pay | Admitting: *Deleted

## 2015-12-09 LAB — PSA, MEDICARE: PSA: 0.33 ng/mL (ref <=4.00)

## 2016-01-29 ENCOUNTER — Telehealth: Payer: Self-pay | Admitting: Family Medicine

## 2016-01-29 NOTE — Telephone Encounter (Signed)
Patient is calling to get rx for his fentanyl patches please call [phone] when ready

## 2016-01-29 NOTE — Telephone Encounter (Signed)
Ok to refill??  Last office visit/ refill 12/07/2016.

## 2016-01-30 MED ORDER — FENTANYL 25 MCG/HR TD PT72
25.0000 ug | MEDICATED_PATCH | TRANSDERMAL | Status: DC
Start: 2016-01-30 — End: 2016-02-27

## 2016-01-30 NOTE — Telephone Encounter (Signed)
Okay to refill? 

## 2016-01-30 NOTE — Telephone Encounter (Signed)
Prescription printed and patient made aware to come to office to pick up after 2pm on 01/30/2016.

## 2016-02-10 ENCOUNTER — Other Ambulatory Visit: Payer: Self-pay | Admitting: Family Medicine

## 2016-02-10 NOTE — Telephone Encounter (Signed)
Medication refilled per protocol. 

## 2016-02-26 ENCOUNTER — Other Ambulatory Visit: Payer: Self-pay | Admitting: Family Medicine

## 2016-02-26 NOTE — Telephone Encounter (Signed)
Patient would like rx for his fentanyl

## 2016-02-27 MED ORDER — FENTANYL 25 MCG/HR TD PT72: 25.0000 ug | MEDICATED_PATCH | TRANSDERMAL | Status: DC

## 2016-02-27 NOTE — Telephone Encounter (Signed)
okay

## 2016-02-27 NOTE — Telephone Encounter (Signed)
Pt aware RX ready

## 2016-02-27 NOTE — Telephone Encounter (Signed)
LRF 01/30/16 #10  LOV 12/08/15  OK refill?

## 2016-03-30 ENCOUNTER — Telehealth: Payer: Self-pay | Admitting: Family Medicine

## 2016-03-30 MED ORDER — FENTANYL 25 MCG/HR TD PT72: 25.0000 ug | MEDICATED_PATCH | TRANSDERMAL | Status: DC

## 2016-03-30 NOTE — Telephone Encounter (Signed)
Pt is calling for a refill of pain patches. [phone]

## 2016-03-30 NOTE — Telephone Encounter (Signed)
Okay to refill? 

## 2016-03-30 NOTE — Telephone Encounter (Signed)
Prescription printed and patient made aware to come to office to pick up after 4pm on 03/30/2016.

## 2016-03-30 NOTE — Telephone Encounter (Signed)
Ok to refill??  Last office visit 12/08/2015.  Last refill 02/27/2016.

## 2016-04-28 ENCOUNTER — Telehealth: Payer: Self-pay | Admitting: Family Medicine

## 2016-04-28 MED ORDER — FENTANYL 25 MCG/HR TD PT72: 25.0000 ug | MEDICATED_PATCH | TRANSDERMAL | Status: DC

## 2016-04-28 NOTE — Telephone Encounter (Signed)
Okay to refill? 

## 2016-04-28 NOTE — Telephone Encounter (Signed)
Ok to refill??      Last ov - 12/08/15 CPE Last refill - 03/30/13

## 2016-04-28 NOTE — Telephone Encounter (Signed)
Patient is calling to get rx for his fentanyl  [phone]

## 2016-04-28 NOTE — Telephone Encounter (Signed)
Number in note is incorrect - called pt on number in chart and lmovm to pu rx - rx printed signed and left up front.

## 2016-05-21 ENCOUNTER — Other Ambulatory Visit: Payer: Self-pay | Admitting: Family Medicine

## 2016-05-21 NOTE — Telephone Encounter (Signed)
Refill appropriate and filled per protocol. 

## 2016-05-26 ENCOUNTER — Telehealth: Payer: Self-pay | Admitting: Family Medicine

## 2016-05-26 MED ORDER — FENTANYL 25 MCG/HR TD PT72: 25.0000 ug | MEDICATED_PATCH | TRANSDERMAL | Status: DC

## 2016-05-26 NOTE — Telephone Encounter (Signed)
Okay 

## 2016-05-26 NOTE — Telephone Encounter (Signed)
Ok to refill??  Last office visit 04/07/2016.  Last refill 04/28/2016.

## 2016-05-26 NOTE — Telephone Encounter (Signed)
Pt is requesting a refill of Fentanyl patches.

## 2016-05-26 NOTE — Telephone Encounter (Signed)
Prescription printed and patient made aware to come to office to pick up 05/27/2016.

## 2016-06-08 ENCOUNTER — Ambulatory Visit: Payer: Commercial Managed Care - HMO | Admitting: Family Medicine

## 2016-06-10 ENCOUNTER — Ambulatory Visit: Payer: Commercial Managed Care - HMO | Admitting: Family Medicine

## 2016-06-14 ENCOUNTER — Ambulatory Visit (INDEPENDENT_AMBULATORY_CARE_PROVIDER_SITE_OTHER): Payer: Commercial Managed Care - HMO | Admitting: Family Medicine

## 2016-06-14 ENCOUNTER — Encounter: Payer: Self-pay | Admitting: Family Medicine

## 2016-06-14 VITALS — BP 142/84 | HR 78 | Temp 98.7°F | Resp 14 | Ht 70.0 in | Wt 156.0 lb

## 2016-06-14 DIAGNOSIS — I10 Essential (primary) hypertension: Secondary | ICD-10-CM

## 2016-06-14 DIAGNOSIS — R109 Unspecified abdominal pain: Secondary | ICD-10-CM | POA: Diagnosis not present

## 2016-06-14 DIAGNOSIS — G8929 Other chronic pain: Secondary | ICD-10-CM | POA: Diagnosis not present

## 2016-06-14 MED ORDER — FENTANYL 25 MCG/HR TD PT72: 25.0000 ug | MEDICATED_PATCH | TRANSDERMAL | Status: DC

## 2016-06-14 NOTE — Patient Instructions (Addendum)
F/U 6 months for Physical Keep eye on your blood pressure,goal is  < 140/90  Call if blood pressure has been high

## 2016-06-14 NOTE — Assessment & Plan Note (Signed)
Chronic abdominal pain status post colostomy in general he's done fairly well. He is still on 25 g fentanyl patch. Is currently difficult to get him taper off the patch sure that he will be able to function with the multiple surgeries that he had on his abdomen without the chronic pain medicine.

## 2016-06-14 NOTE — Progress Notes (Signed)
Patient ID: Johnathan Baldwin, male   DOB: 1956/08/27, 60 y.o.   MRN: [id number]    Subjective:    Patient ID: Johnathan Edisonay C Colclasure, male    DOB: 1956/08/27, 60 y.o.   MRN: [id number]  Patient presents for F/U Patient here to follow chronic medical prongs. He is on chronic fentanyl patches secondary to his chronic abdominal pain which she suffered as a work injury and resulted in colostomy. He is also on amlodipine for his hypertension without any side effects. She tried tapering off of his fentanyl by going every other day he does get significant pain  Review Of Systems: per above   GEN- denies fatigue, fever, weight loss,weakness, recent illness HEENT- denies eye drainage, change in vision, nasal discharge, CVS- denies chest pain, palpitations RESP- denies SOB, cough, wheeze ABD- denies N/V, change in stools, abd pain GU- denies dysuria, hematuria, dribbling, incontinence MSK- denies joint pain, muscle aches, injury Neuro- denies headache, dizziness, syncope, seizure activity       Objective:    BP 142/84 mmHg  Pulse 78  Temp(Src) 98.7 F (37.1 C) (Oral)  Resp 14  Ht 5\' 10"  (1.778 m)  Wt 156 lb (70.761 kg)  BMI 22.38 kg/m2 GEN- NAD, alert and oriented x3 HEENT- PERRL, EOMI, non injected sclera, pink conjunctiva, MMM, oropharynx clear Neck- Supple, no thyromegaly CVS- RRR, no murmur RESP-CTAB ABD-NABS,soft,NT,ND,colostomy in tact  EXT- No edema Pulses- Radial, DP- 2+        Assessment & Plan:      Problem List Items Addressed This Visit    HTN (hypertension), benign - Primary    Blood pressures a little elevated today. He will monitor at home if his blood pressures consistently greater than 140/90 then 1 increase amlodipine to 10 mg.      Chronic abdominal pain     Chronic abdominal pain status post colostomy in general he's done fairly well. He is still on 25 g fentanyl patch. Is currently difficult to get him taper off the patch sure that he will be able to function  with the multiple surgeries that he had on his abdomen without the chronic pain medicine.      Relevant Medications   fentaNYL (DURAGESIC - DOSED MCG/HR) 25 MCG/HR patch      Note: This dictation was prepared with Dragon dictation along with smaller phrase technology. Any transcriptional errors that result from this process are unintentional.

## 2016-06-14 NOTE — Assessment & Plan Note (Signed)
Blood pressures a little elevated today. He will monitor at home if his blood pressures consistently greater than 140/90 then 1 increase amlodipine to 10 mg.

## 2016-06-21 ENCOUNTER — Other Ambulatory Visit: Payer: Self-pay | Admitting: Family Medicine

## 2016-06-21 NOTE — Telephone Encounter (Signed)
Refill appropriate and filled per protocol. 

## 2016-07-27 ENCOUNTER — Telehealth: Payer: Self-pay | Admitting: *Deleted

## 2016-07-27 MED ORDER — FENTANYL 25 MCG/HR TD PT72: 25.0000 ug | MEDICATED_PATCH | TRANSDERMAL | 0 refills | Status: DC

## 2016-07-27 NOTE — Telephone Encounter (Signed)
Okay to refill? 

## 2016-07-27 NOTE — Telephone Encounter (Signed)
Received call from patient.   Requested refill on Fnetanyl.   Ok to refill??  Last office visit/ refill 06/14/2016 for July.

## 2016-07-27 NOTE — Telephone Encounter (Signed)
Prescription printed and patient made aware to come to office to pick up on 07/28/2016.

## 2016-08-13 ENCOUNTER — Other Ambulatory Visit: Payer: Self-pay | Admitting: Family Medicine

## 2016-08-13 MED ORDER — FENTANYL 25 MCG/HR TD PT72: 25.0000 ug | MEDICATED_PATCH | TRANSDERMAL | 0 refills | Status: DC

## 2016-08-13 NOTE — Telephone Encounter (Signed)
Records indicate prescription refill appropriate for Duragesic.  MD made aware and approved refill.   Prescription printed and patient made aware to come to office to pick up after 2 pm on 08/13/2016.

## 2016-09-07 ENCOUNTER — Other Ambulatory Visit: Payer: Self-pay | Admitting: Family Medicine

## 2016-09-20 ENCOUNTER — Other Ambulatory Visit: Payer: Self-pay | Admitting: Family Medicine

## 2016-09-24 ENCOUNTER — Telehealth: Payer: Self-pay | Admitting: *Deleted

## 2016-09-24 MED ORDER — FENTANYL 25 MCG/HR TD PT72: 25.0000 ug | MEDICATED_PATCH | TRANSDERMAL | 0 refills | Status: DC

## 2016-09-24 NOTE — Telephone Encounter (Signed)
Prescription printed and patient made aware to come to office to pick up after 4pm on 09/24/2016. 

## 2016-09-24 NOTE — Telephone Encounter (Signed)
okay

## 2016-09-24 NOTE — Telephone Encounter (Signed)
Received call from patient.   Requested refill on Fentanyl.   Ok to refill??  Last office visit 06/14/2016.  Last refill 08/13/2016.

## 2016-10-25 ENCOUNTER — Other Ambulatory Visit: Payer: Self-pay | Admitting: Family Medicine

## 2016-10-25 NOTE — Telephone Encounter (Signed)
LRF 09/24/16 #10  LOV 06/14/16  OK refill?

## 2016-10-25 NOTE — Telephone Encounter (Signed)
okay

## 2016-10-26 ENCOUNTER — Other Ambulatory Visit: Payer: Self-pay | Admitting: Family Medicine

## 2016-10-26 MED ORDER — FENTANYL 25 MCG/HR TD PT72: 25.0000 ug | MEDICATED_PATCH | TRANSDERMAL | 0 refills | Status: DC

## 2016-10-26 NOTE — Telephone Encounter (Signed)
RX printed pt to come pick up

## 2016-11-23 ENCOUNTER — Telehealth: Payer: Self-pay | Admitting: *Deleted

## 2016-11-23 NOTE — Telephone Encounter (Signed)
Received call from patient.   Requested refill on Fentanyl.   Ok to refill??  Last office visit 06/14/2016.  Last refill 10/26/2016.

## 2016-11-23 NOTE — Telephone Encounter (Signed)
okay

## 2016-11-24 ENCOUNTER — Other Ambulatory Visit: Payer: Self-pay | Admitting: Family Medicine

## 2016-11-24 MED ORDER — FENTANYL 25 MCG/HR TD PT72: 25.0000 ug | MEDICATED_PATCH | TRANSDERMAL | 0 refills | Status: DC

## 2016-11-24 NOTE — Telephone Encounter (Signed)
Prescription printed.   Call placed to patient. No answer. No VM. 

## 2016-11-24 NOTE — Telephone Encounter (Signed)
Call placed to patient and patient made aware.  

## 2016-11-25 ENCOUNTER — Encounter: Payer: Self-pay | Admitting: Family Medicine

## 2016-12-17 ENCOUNTER — Encounter: Payer: Commercial Managed Care - HMO | Admitting: Family Medicine

## 2016-12-23 ENCOUNTER — Telehealth: Payer: Self-pay | Admitting: *Deleted

## 2016-12-23 DIAGNOSIS — Z933 Colostomy status: Secondary | ICD-10-CM | POA: Diagnosis not present

## 2016-12-23 MED ORDER — FENTANYL 25 MCG/HR TD PT72: 25.0000 ug | MEDICATED_PATCH | TRANSDERMAL | 0 refills | Status: DC

## 2016-12-23 NOTE — Telephone Encounter (Signed)
Records indicate prescription refill appropriate for fentanyl.   MD made aware and prescription approved.   Prescription printed and patient made aware to come to office to pick up on 12/24/2016.

## 2016-12-24 ENCOUNTER — Other Ambulatory Visit: Payer: Self-pay | Admitting: Family Medicine

## 2017-01-03 ENCOUNTER — Encounter: Payer: Commercial Managed Care - HMO | Admitting: Family Medicine

## 2017-01-24 ENCOUNTER — Other Ambulatory Visit: Payer: Self-pay | Admitting: Family Medicine

## 2017-01-24 ENCOUNTER — Telehealth: Payer: Self-pay | Admitting: *Deleted

## 2017-01-24 MED ORDER — FENTANYL 25 MCG/HR TD PT72: 25.0000 ug | MEDICATED_PATCH | TRANSDERMAL | 0 refills | Status: DC

## 2017-01-24 NOTE — Telephone Encounter (Signed)
Prescription printed and patient made aware to come to office to pick up on 01/25/2017. 

## 2017-01-24 NOTE — Telephone Encounter (Signed)
okay

## 2017-01-24 NOTE — Telephone Encounter (Signed)
Received call from patient.   Requested refill on Duragesic.   Ok to refill??  Last office visit 06/14/2016.  Last refill 12/23/2016.

## 2017-02-22 ENCOUNTER — Other Ambulatory Visit: Payer: Self-pay | Admitting: Family Medicine

## 2017-02-23 ENCOUNTER — Telehealth: Payer: Self-pay | Admitting: *Deleted

## 2017-02-23 MED ORDER — FENTANYL 25 MCG/HR TD PT72: 25.0000 ug | MEDICATED_PATCH | TRANSDERMAL | 0 refills | Status: DC

## 2017-02-23 NOTE — Telephone Encounter (Signed)
Needs OV Okay to refill

## 2017-02-23 NOTE — Telephone Encounter (Signed)
Received VM from patient.   Requested refill on Fentanyl.   Ok to refill??  Last office visit 06/14/2016.  Last refill 01/24/2017.

## 2017-02-24 NOTE — Telephone Encounter (Signed)
Prescription printed and patient made aware to come to office to pick up on 02/24/2017. 

## 2017-03-21 ENCOUNTER — Other Ambulatory Visit: Payer: Self-pay | Admitting: *Deleted

## 2017-03-21 MED ORDER — FENTANYL 25 MCG/HR TD PT72: 25.0000 ug | MEDICATED_PATCH | TRANSDERMAL | 0 refills | Status: DC

## 2017-03-21 NOTE — Telephone Encounter (Signed)
Records indicate prescription refill appropriate for Fentanyl.  Ok to refill??  Last office visit 06/14/2016.  Last refill 02/23/2017.

## 2017-03-21 NOTE — Telephone Encounter (Signed)
Okay He needs OV

## 2017-03-24 ENCOUNTER — Other Ambulatory Visit: Payer: Self-pay | Admitting: Family Medicine

## 2017-03-24 ENCOUNTER — Telehealth: Payer: Self-pay | Admitting: Family Medicine

## 2017-03-24 NOTE — Telephone Encounter (Signed)
Refill appropriate 

## 2017-03-24 NOTE — Telephone Encounter (Signed)
Pt calling in regards to status on his fentanyl patches. I see where doctor [place] approved it

## 2017-03-28 ENCOUNTER — Other Ambulatory Visit: Payer: Self-pay | Admitting: Family Medicine

## 2017-03-28 NOTE — Telephone Encounter (Signed)
Patient made aware to come to office to pick up.  

## 2017-04-21 ENCOUNTER — Other Ambulatory Visit: Payer: Self-pay | Admitting: Family Medicine

## 2017-04-22 ENCOUNTER — Telehealth: Payer: Self-pay | Admitting: *Deleted

## 2017-04-22 NOTE — Telephone Encounter (Signed)
Received call from patient.   Requested refill on Fentanyl.   Ok to refill??  Last office visit 06/14/2016.  Last refill 03/21/2017.

## 2017-04-22 NOTE — Telephone Encounter (Signed)
Call pt advise I can not refill his pain medication any further if he doesn't come in for visit Give him 2 week supply only

## 2017-04-22 NOTE — Telephone Encounter (Signed)
Call placed to patient.   Appointment scheduled for 04/25/2017.  Will hold off on printing half script since patient is scheduled to be seen on Monday.

## 2017-04-25 ENCOUNTER — Ambulatory Visit (INDEPENDENT_AMBULATORY_CARE_PROVIDER_SITE_OTHER): Payer: Commercial Managed Care - HMO | Admitting: Family Medicine

## 2017-04-25 ENCOUNTER — Encounter: Payer: Self-pay | Admitting: Family Medicine

## 2017-04-25 VITALS — BP 150/90 | HR 82 | Temp 98.1°F | Resp 14 | Ht 70.0 in | Wt 148.0 lb

## 2017-04-25 DIAGNOSIS — E441 Mild protein-calorie malnutrition: Secondary | ICD-10-CM

## 2017-04-25 DIAGNOSIS — I10 Essential (primary) hypertension: Secondary | ICD-10-CM

## 2017-04-25 DIAGNOSIS — E46 Unspecified protein-calorie malnutrition: Secondary | ICD-10-CM | POA: Insufficient documentation

## 2017-04-25 DIAGNOSIS — R109 Unspecified abdominal pain: Secondary | ICD-10-CM

## 2017-04-25 DIAGNOSIS — G8929 Other chronic pain: Secondary | ICD-10-CM | POA: Diagnosis not present

## 2017-04-25 LAB — CBC WITH DIFFERENTIAL/PLATELET
BASOS ABS: 0 {cells}/uL (ref 0–200)
Basophils Relative: 0 %
EOS PCT: 6 %
Eosinophils Absolute: 288 cells/uL (ref 15–500)
HCT: 40.5 % (ref 38.5–50.0)
HEMOGLOBIN: 13.3 g/dL (ref 13.0–17.0)
LYMPHS PCT: 24 %
Lymphs Abs: 1152 cells/uL (ref 850–3900)
MCH: 31.1 pg (ref 27.0–33.0)
MCHC: 32.8 g/dL (ref 32.0–36.0)
MCV: 94.6 fL (ref 80.0–100.0)
MONOS PCT: 10 %
MPV: 9.8 fL (ref 7.5–12.5)
Monocytes Absolute: 480 cells/uL (ref 200–950)
NEUTROS PCT: 60 %
Neutro Abs: 2880 cells/uL (ref 1500–7800)
Platelets: 345 10*3/uL (ref 140–400)
RBC: 4.28 MIL/uL (ref 4.20–5.80)
RDW: 13.8 % (ref 11.0–15.0)
WBC: 4.8 10*3/uL (ref 3.8–10.8)

## 2017-04-25 LAB — LIPID PANEL
Cholesterol: 187 mg/dL (ref <200)
HDL: 128 mg/dL (ref >40)
LDL CALC: 45 mg/dL (ref <100)
TRIGLYCERIDES: 70 mg/dL (ref <150)
Total CHOL/HDL Ratio: 1.5 Ratio (ref <5.0)
VLDL: 14 mg/dL (ref <30)

## 2017-04-25 LAB — COMPREHENSIVE METABOLIC PANEL
ALBUMIN: 4.2 g/dL (ref 3.6–5.1)
ALT: 13 U/L (ref 9–46)
AST: 27 U/L (ref 10–35)
Alkaline Phosphatase: 75 U/L (ref 40–115)
BUN: 15 mg/dL (ref 7–25)
CHLORIDE: 103 mmol/L (ref 98–110)
CO2: 26 mmol/L (ref 20–31)
CREATININE: 1.29 mg/dL (ref 0.70–1.33)
Calcium: 9.5 mg/dL (ref 8.6–10.3)
Glucose, Bld: 93 mg/dL (ref 70–99)
Potassium: 4.1 mmol/L (ref 3.5–5.3)
SODIUM: 139 mmol/L (ref 135–146)
Total Bilirubin: 0.7 mg/dL (ref 0.2–1.2)
Total Protein: 7.3 g/dL (ref 6.1–8.1)

## 2017-04-25 MED ORDER — AMLODIPINE BESYLATE 10 MG PO TABS: 10.0000 mg | ORAL_TABLET | Freq: Every day | ORAL | 2 refills | Status: DC

## 2017-04-25 MED ORDER — RANITIDINE HCL 75 MG/5ML PO SYRP: ORAL_SOLUTION | ORAL | 3 refills | Status: DC

## 2017-04-25 MED ORDER — FENTANYL 25 MCG/HR TD PT72: 25.0000 ug | MEDICATED_PATCH | TRANSDERMAL | 0 refills | Status: DC

## 2017-04-25 NOTE — Assessment & Plan Note (Signed)
Uncontrolled increase norvasc to 10mg  Check CMET, CBC, Lipid non fasting but has not followed up in quite some time

## 2017-04-25 NOTE — Patient Instructions (Addendum)
We will call with lab results Norvasc increased to 10mg  Keep previous appointment  Increase your protein F/U as previous

## 2017-04-25 NOTE — Assessment & Plan Note (Signed)
Concern with his overall diet, increase protein to sustain his muscle mass Can try boost/ensure or protein powder And monitor for any changes in output from colostomy

## 2017-04-25 NOTE — Progress Notes (Signed)
   Subjective:    Patient ID: Johnathan Baldwin, male    DOB: December 16, 1956, 61 y.o.   MRN: [id number]  Patient presents for Follow-up (is not fasting)  Pt here to f/u chronic medciations He is on Fentanyl patches due to chronic abdominal pain , which required surgical intervention in 2000 and colostomy placement. He is on lowest dose which allows him to function.  He is also on zantac for reflux related symptoms  HTN- taking norvsac as prescribed, no concerns Due for labs   He does continue to get bilateral shoulder pain but does not want to see orthopedics for this.  Discussed diet and his weight is down 8 pounds since last year. He often fluctuates he admits that he has not been getting very much protein in. This AM had chips for breakfast, states he does this a lot  Still follows with [facility]    Review Of Systems:  GEN- denies fatigue, fever, weight loss,weakness, recent illness HEENT- denies eye drainage, change in vision, nasal discharge, CVS- denies chest pain, palpitations RESP- denies SOB, cough, wheeze ABD- denies N/V, change in stools, abd pain GU- denies dysuria, hematuria, dribbling, incontinence MSK- denies joint pain, muscle aches, injury Neuro- denies headache, dizziness, syncope, seizure activity       Objective:    BP (!) 150/90   Pulse 82   Temp 98.1 F (36.7 C) (Oral)   Resp 14   Ht 5\' 10"  (1.778 m)   Wt 148 lb (67.1 kg)   SpO2 98%   BMI 21.24 kg/m  GEN- NAD, alert and oriented x3 HEENT- PERRL, EOMI, non injected sclera, pink conjunctiva, MMM, oropharynx clear Neck- Supple, no thyromegaly CVS- RRR, no murmur RESP-CTAB ABD-NABS,soft,NT,ND,colostomy in tact EXT- No edema Pulses- Radial 2+        Assessment & Plan:      Problem List Items Addressed This Visit    Protein-calorie malnutrition (HCC)    Concern with his overall diet, increase protein to sustain his muscle mass Can try boost/ensure or protein powder And monitor for any  changes in output from colostomy      HTN (hypertension), benign    Uncontrolled increase norvasc to 10mg  Check CMET, CBC, Lipid non fasting but has not followed up in quite some time      Relevant Medications   amLODipine (NORVASC) 10 MG tablet   Other Relevant Orders   CBC with Differential/Platelet   Comprehensive metabolic panel   Lipid panel   Chronic abdominal pain - Primary    Continue fentanyl, difficulty unable to transition to oral due to absorption problems.       Relevant Medications   fentaNYL (DURAGESIC - DOSED MCG/HR) 25 MCG/HR patch      Note: This dictation was prepared with Dragon dictation along with smaller phrase technology. Any transcriptional errors that result from this process are unintentional.

## 2017-04-25 NOTE — Assessment & Plan Note (Signed)
Continue fentanyl, difficulty unable to transition to oral due to absorption problems.

## 2017-04-26 ENCOUNTER — Encounter: Payer: Self-pay | Admitting: *Deleted

## 2017-05-11 ENCOUNTER — Ambulatory Visit (INDEPENDENT_AMBULATORY_CARE_PROVIDER_SITE_OTHER): Payer: Commercial Managed Care - HMO | Admitting: Family Medicine

## 2017-05-11 ENCOUNTER — Encounter: Payer: Self-pay | Admitting: Family Medicine

## 2017-05-11 VITALS — BP 136/80 | HR 68 | Temp 98.1°F | Resp 16 | Ht 70.0 in | Wt 148.0 lb

## 2017-05-11 DIAGNOSIS — I10 Essential (primary) hypertension: Secondary | ICD-10-CM

## 2017-05-11 DIAGNOSIS — Z125 Encounter for screening for malignant neoplasm of prostate: Secondary | ICD-10-CM | POA: Diagnosis not present

## 2017-05-11 DIAGNOSIS — Z23 Encounter for immunization: Secondary | ICD-10-CM | POA: Diagnosis not present

## 2017-05-11 DIAGNOSIS — Z0000 Encounter for general adult medical examination without abnormal findings: Secondary | ICD-10-CM | POA: Diagnosis not present

## 2017-05-11 MED ORDER — FENTANYL 25 MCG/HR TD PT72: 25.0000 ug | MEDICATED_PATCH | TRANSDERMAL | 0 refills | Status: DC

## 2017-05-11 NOTE — Assessment & Plan Note (Signed)
Controlled.  

## 2017-05-11 NOTE — Progress Notes (Signed)
Subjective:   Patient presents for Medicare Annual/Subsequent preventive examination.   Pt here for annual wellness exam Medications reviewed  No concerns  Norvasc was increased to 10mg  3 weeks ago due to uncontrolled BP Weight is stable   Review Past Medical/Family/Social: Per EMR   Risk Factors  Current exercise habits:Works out at [employer] Dietary issues discussed: Yes, getting more protein   Cardiac risk factors: HTN   Depression Screen  (Note: if answer to either of the following is "Yes", a more complete depression screening is indicated)  Over the past two weeks, have you felt down, depressed or hopeless? No Over the past two weeks, have you felt little interest or pleasure in doing things? No Have you lost interest or pleasure in daily life? No Do you often feel hopeless? No Do you cry easily over simple problems? No   Activities of Daily Living  In your present state of health, do you have any difficulty performing the following activities?:  Driving? No  Managing money? No  Feeding yourself? No  Getting from bed to chair? No  Climbing a flight of stairs? No  Preparing food and eating?: No  Bathing or showering? No  Getting dressed: No  Getting to the toilet? No  Using the toilet:No  Moving around from place to place: No  In the past year have you fallen or had a near fall?:No  Are you sexually active? No  Do you have more than one partner? No   Hearing Difficulties: No  Do you often ask people to speak up or repeat themselves? No  Do you experience ringing or noises in your ears? No Do you have difficulty understanding soft or whispered voices? No  Do you feel that you have a problem with memory? No Do you often misplace items? No  Do you feel safe at home? Yes  Cognitive Testing  Alert? Yes Normal Appearance?Yes  Oriented to person? Yes Place? Yes  Time? Yes  Recall of three objects? Yes  Can perform simple calculations? Yes  Displays appropriate  judgment?Yes  Can read the correct time from a watch face?Yes   List the Names of Other Physician/Practitioners you currently use: None currently      Screening Tests / Date Colonoscopy       UTD              Zostavax - Discussed  Influenza Vaccine Pt declines Tetanus/tdap UTD  ROS: GEN- denies fatigue, fever, weight loss,weakness, recent illness HEENT- denies eye drainage, change in vision, nasal discharge, CVS- denies chest pain, palpitations RESP- denies SOB, cough, wheeze ABD- denies N/V, change in stools,+ chronic abd pain GU- denies dysuria, hematuria, dribbling, incontinence MSK- denies joint pain, muscle aches, injury Neuro- denies headache, dizziness, syncope, seizure activity  Physical: GEN- NAD, alert and oriented x3 HEENT- PERRL, EOMI, non injected sclera, pink conjunctiva, MMM, oropharynx clear Neck- Supple, no thryomegaly CVS- RRR, no murmur RESP-CTAB ABD-NABS,soft,NT,ND,colostomy EXT- No edema Pulses- Radial, DP- 2+    Assessment:    Annual wellness medicare exam   Plan:    During the course of the visit the patient was educated and counseled about appropriate screening and preventive services including:   Shingles vaccine.Given Screen NEG for depression.  Fasting labs done 2 weeks ago were normal. Needs PSA screening today  Form completed for [employer] application  Blood pressure much improved continue norvasc 10mg    Diet review for nutrition referral? Yes ____ Not Indicated __x__  Patient Instructions (the written  plan) was given to the patient.  Medicare Attestation  I have personally reviewed:  The patient's medical and social history  Their use of alcohol, tobacco or illicit drugs  Their current medications and supplements  The patient's functional ability including ADLs,fall risks, home safety risks, cognitive, and hearing and visual impairment  Diet and physical activities  Evidence for depression or mood disorders  The patient's  weight, height, BMI, and visual acuity have been recorded in the chart. I have made referrals, counseling, and provided education to the patient based on review of the above and I have provided the patient with a written personalized care plan for preventive services.

## 2017-05-11 NOTE — Patient Instructions (Addendum)
Schedule eye visit  We will call with PSA Shingles vaccine given F/U 6 months

## 2017-05-12 LAB — PSA: PSA: 0.9 ng/mL (ref <=4.0)

## 2017-05-25 ENCOUNTER — Telehealth: Payer: Self-pay | Admitting: *Deleted

## 2017-05-25 NOTE — Telephone Encounter (Signed)
Received call from patient.   Requested refill on Fentanyl. Patient did not take prescription with him at last visit. Prescription is available at front desk for pick up.   Call placed to patient and patient made aware.

## 2017-06-17 DIAGNOSIS — Z933 Colostomy status: Secondary | ICD-10-CM | POA: Diagnosis not present

## 2017-06-27 ENCOUNTER — Telehealth: Payer: Self-pay | Admitting: *Deleted

## 2017-06-27 NOTE — Telephone Encounter (Signed)
Received call from patient. Requested refill on Duragesic.   Ok to refill??  Last office visit/ refill 05/11/2017 for June. Prescription was filled on 05/26/2017 per pharmacy.

## 2017-06-28 MED ORDER — FENTANYL 25 MCG/HR TD PT72: 25.0000 ug | MEDICATED_PATCH | TRANSDERMAL | 0 refills | Status: DC

## 2017-06-28 NOTE — Telephone Encounter (Signed)
Prescription printed and patient made aware to come to office to pick up.  

## 2017-06-28 NOTE — Telephone Encounter (Signed)
ok 

## 2017-07-27 ENCOUNTER — Telehealth: Payer: Self-pay | Admitting: *Deleted

## 2017-07-27 MED ORDER — FENTANYL 25 MCG/HR TD PT72: 25.0000 ug | MEDICATED_PATCH | TRANSDERMAL | 0 refills | Status: DC

## 2017-07-27 NOTE — Telephone Encounter (Signed)
Okay to refill? 

## 2017-07-27 NOTE — Telephone Encounter (Signed)
Received call from patient.   Requested refill on Fentanyl Patches.   Ok to refill??  Last office visit 05/11/2017.  Last refill 06/28/2017.

## 2017-07-27 NOTE — Telephone Encounter (Signed)
Prescription printed and patient made aware to come to office to pick up after 3 pm on 07/27/2017. 

## 2017-08-12 ENCOUNTER — Ambulatory Visit: Payer: 59

## 2017-08-16 ENCOUNTER — Other Ambulatory Visit: Payer: Self-pay | Admitting: Family Medicine

## 2017-08-24 ENCOUNTER — Ambulatory Visit (INDEPENDENT_AMBULATORY_CARE_PROVIDER_SITE_OTHER): Payer: 59 | Admitting: *Deleted

## 2017-08-24 DIAGNOSIS — Z23 Encounter for immunization: Secondary | ICD-10-CM | POA: Diagnosis not present

## 2017-08-24 MED ORDER — FENTANYL 25 MCG/HR TD PT72: 25.0000 ug | MEDICATED_PATCH | TRANSDERMAL | 0 refills | Status: DC

## 2017-08-24 NOTE — Progress Notes (Signed)
Patient seen in office for Shingles Vaccination.   Tolerated IM administration well.   Immunization history updated.  

## 2017-08-24 NOTE — Progress Notes (Signed)
Patient in office for immunization.   Requested refill on Duragesic. Last refill noted 07/27/2017. MD approved.   Prescription printed and given to patient.

## 2017-09-26 ENCOUNTER — Telehealth: Payer: Self-pay | Admitting: Family Medicine

## 2017-09-26 MED ORDER — FENTANYL 25 MCG/HR TD PT72: 25.0000 ug | MEDICATED_PATCH | TRANSDERMAL | 0 refills | Status: DC

## 2017-09-26 NOTE — Telephone Encounter (Signed)
RX printed, left up front and patient aware to pick up in am 

## 2017-09-26 NOTE — Telephone Encounter (Signed)
Pt called requesting a refill on Fentanyl patch  - Ok to refill??      LRF -08/24/17

## 2017-09-26 NOTE — Telephone Encounter (Signed)
Okay to refill? 

## 2017-09-27 DIAGNOSIS — Z933 Colostomy status: Secondary | ICD-10-CM | POA: Diagnosis not present

## 2017-10-13 DIAGNOSIS — Z933 Colostomy status: Secondary | ICD-10-CM | POA: Diagnosis not present

## 2017-10-17 ENCOUNTER — Other Ambulatory Visit: Payer: Self-pay | Admitting: Family Medicine

## 2017-10-25 ENCOUNTER — Telehealth: Payer: Self-pay | Admitting: *Deleted

## 2017-10-25 NOTE — Telephone Encounter (Signed)
Received call from patient.   Requested refill on Fentanyl.   Ok to refill??  Last office visit 05/11/2017.  Last refill 09/26/2017.

## 2017-10-25 NOTE — Telephone Encounter (Signed)
He is due for 6 month OV He can get 1 month refill

## 2017-10-26 MED ORDER — FENTANYL 25 MCG/HR TD PT72: 25.0000 ug | MEDICATED_PATCH | TRANSDERMAL | 0 refills | Status: DC

## 2017-10-27 NOTE — Telephone Encounter (Signed)
Prescription printed and patient made aware to come to office to pick up.   Will schedule appointment at that time.

## 2017-11-14 ENCOUNTER — Ambulatory Visit: Payer: Commercial Managed Care - HMO | Admitting: Family Medicine

## 2017-11-15 ENCOUNTER — Encounter: Payer: Self-pay | Admitting: Family Medicine

## 2017-11-18 ENCOUNTER — Other Ambulatory Visit: Payer: Self-pay

## 2017-11-18 ENCOUNTER — Ambulatory Visit: Payer: 59 | Admitting: Family Medicine

## 2017-11-18 ENCOUNTER — Encounter: Payer: Self-pay | Admitting: Family Medicine

## 2017-11-18 VITALS — BP 138/74 | HR 78 | Temp 98.4°F | Resp 16 | Ht 70.0 in | Wt 144.0 lb

## 2017-11-18 DIAGNOSIS — M25511 Pain in right shoulder: Secondary | ICD-10-CM | POA: Diagnosis not present

## 2017-11-18 DIAGNOSIS — G8929 Other chronic pain: Secondary | ICD-10-CM | POA: Diagnosis not present

## 2017-11-18 DIAGNOSIS — M7702 Medial epicondylitis, left elbow: Secondary | ICD-10-CM

## 2017-11-18 DIAGNOSIS — I10 Essential (primary) hypertension: Secondary | ICD-10-CM | POA: Diagnosis not present

## 2017-11-18 DIAGNOSIS — R109 Unspecified abdominal pain: Secondary | ICD-10-CM

## 2017-11-18 MED ORDER — FENTANYL 25 MCG/HR TD PT72: 25.0000 ug | MEDICATED_PATCH | TRANSDERMAL | 0 refills | Status: DC

## 2017-11-18 MED ORDER — RANITIDINE HCL 75 MG/5ML PO SYRP: ORAL_SOLUTION | ORAL | 6 refills | Status: DC

## 2017-11-18 MED ORDER — MELOXICAM 15 MG PO TABS: 15.0000 mg | ORAL_TABLET | Freq: Every day | ORAL | 6 refills | Status: DC

## 2017-11-18 MED ORDER — AMLODIPINE BESYLATE 10 MG PO TABS: 10.0000 mg | ORAL_TABLET | Freq: Every day | ORAL | 2 refills | Status: DC

## 2017-11-18 NOTE — Assessment & Plan Note (Signed)
Chronic pain he takes meloxicam as needed.  Also has fentanyl for his chronic abdominal pain.  We discussed orthopedics he declines we discussed imaging he declines.  He also has some mild epicondylitis.  We discussed changing his workouts.  Using an elbow band for compression.  Already has anti-inflammatory.

## 2017-11-18 NOTE — Patient Instructions (Addendum)
Continue current pain meds Use elbow sleeve  Continue all other medications F/U 6 months for Physical - end of May

## 2017-11-18 NOTE — Progress Notes (Signed)
   Subjective:    Patient ID: Johnathan Baldwin, male    DOB: 07-Feb-1956, 61 y.o.   MRN: [id number]  Patient presents for Follow-up (is not fasting)  HTN- taking norvasc as prescribed, no concerns, continues to exercise regulary  Has some pain on left elbow- stays on medial aspect of elbow for past few weeks,no swelling, no radiation of pain, no specific injuries , but works out on regular basis  Right shoulder pain - uses mobic, he does modify his workout for the pain has had pain for > 2 years now Chronic abdominal pain- uses fentayl patch every 3 days     Review Of Systems:  GEN- denies fatigue, fever, weight loss,weakness, recent illness HEENT- denies eye drainage, change in vision, nasal discharge, CVS- denies chest pain, palpitations RESP- denies SOB, cough, wheeze ABD- denies N/V, change in stools, abd pain GU- denies dysuria, hematuria, dribbling, incontinence MSK- + joint pain, muscle aches, injury Neuro- denies headache, dizziness, syncope, seizure activity       Objective:    BP 138/74   Pulse 78   Temp 98.4 F (36.9 C) (Oral)   Resp 16   Ht 5\' 10"  (1.778 m)   Wt 144 lb (65.3 kg)   SpO2 98%   BMI 20.66 kg/m  GEN- NAD, alert and oriented x3 HEENT- PERRL, EOMI, non injected sclera, pink conjunctiva, MMM, oropharynx clear Neck- Supple, no LAD CVS- RRR, no murmur RESP-CTAB ABD-NABS,soft,NT,ND, colostomy in tact  MSK- Left elbow normal inspection, FROM, mild TTP Medial epicondyle, mild pain with rotation of forearm FROm upper extremeity, rotator cuff in tact, biceps in tact  EXT- No edema Pulses- Radial, DP- 2+        Assessment & Plan:      Problem List Items Addressed This Visit      Unprioritized   Chronic abdominal pain   Relevant Medications   fentaNYL (DURAGESIC - DOSED MCG/HR) 25 MCG/HR patch   meloxicam (MOBIC) 15 MG tablet   HTN (hypertension), benign    Blood pressure controlled no change in medication.      Relevant Medications   amLODipine (NORVASC) 10 MG tablet   Chronic shoulder pain - Primary    Chronic pain he takes meloxicam as needed.  Also has fentanyl for his chronic abdominal pain.  We discussed orthopedics he declines we discussed imaging he declines.  He also has some mild epicondylitis.  We discussed changing his workouts.  Using an elbow band for compression.  Already has anti-inflammatory.      Relevant Medications   fentaNYL (DURAGESIC - DOSED MCG/HR) 25 MCG/HR patch   meloxicam (MOBIC) 15 MG tablet    Other Visit Diagnoses    Epicondylitis elbow, medial, left       Relevant Medications   fentaNYL (DURAGESIC - DOSED MCG/HR) 25 MCG/HR patch   meloxicam (MOBIC) 15 MG tablet      Note: This dictation was prepared with Dragon dictation along with smaller phrase technology. Any transcriptional errors that result from this process are unintentional.

## 2017-11-18 NOTE — Assessment & Plan Note (Signed)
Blood pressure controlled no change in medication. 

## 2017-12-14 ENCOUNTER — Emergency Department (HOSPITAL_COMMUNITY)
Admission: EM | Admit: 2017-12-14 | Discharge: 2017-12-14 | Disposition: A | Discharge status: Home / Self Care | Payer: 59 | Attending: Emergency Medicine | Admitting: Emergency Medicine

## 2017-12-14 ENCOUNTER — Encounter (HOSPITAL_COMMUNITY): Payer: Self-pay | Admitting: Emergency Medicine

## 2017-12-14 DIAGNOSIS — W260XXA Contact with knife, initial encounter: Secondary | ICD-10-CM | POA: Diagnosis not present

## 2017-12-14 DIAGNOSIS — S66222A Laceration of extensor muscle, fascia and tendon of left thumb at wrist and hand level, initial encounter: Secondary | ICD-10-CM | POA: Diagnosis not present

## 2017-12-14 DIAGNOSIS — S61012A Laceration without foreign body of left thumb without damage to nail, initial encounter: Secondary | ICD-10-CM | POA: Insufficient documentation

## 2017-12-14 DIAGNOSIS — Y9389 Activity, other specified: Secondary | ICD-10-CM | POA: Insufficient documentation

## 2017-12-14 DIAGNOSIS — Y929 Unspecified place or not applicable: Secondary | ICD-10-CM | POA: Diagnosis not present

## 2017-12-14 DIAGNOSIS — Y999 Unspecified external cause status: Secondary | ICD-10-CM | POA: Insufficient documentation

## 2017-12-14 MED ORDER — LIDOCAINE HCL (PF) 1 % IJ SOLN
5.0000 mL | Freq: Once | INTRAMUSCULAR | Status: AC
  Administered 2017-12-14: 5 mL via INTRADERMAL

## 2017-12-14 MED ORDER — POVIDONE-IODINE 10 % EX SOLN
1.0000 | Freq: Once | CUTANEOUS | Status: AC
Start: 2017-12-14 — End: 2017-12-14
  Administered 2017-12-14: 1 via TOPICAL

## 2017-12-14 MED ORDER — LIDOCAINE HCL (PF) 1 % IJ SOLN
INTRAMUSCULAR | Status: AC
  Administered 2017-12-14: 5 mL via INTRADERMAL
  Filled 2017-12-14: qty 5

## 2017-12-14 MED ORDER — POVIDONE-IODINE 10 % EX SOLN
CUTANEOUS | Status: AC
  Administered 2017-12-14: 1 via TOPICAL
  Filled 2017-12-14: qty 15

## 2017-12-14 NOTE — Discharge Instructions (Signed)
Keep the wound clean, bandaged and splinted.  Call the hand surgeon listed to arrange a follow-up appt. Sutures out in 8-10 days.  Tylenol if needed for pain

## 2017-12-14 NOTE — ED Provider Notes (Signed)
[facility]GENCY [facility]PARTMENT Provider Note   CSN: [id number] Arrival date & time: 12/14/17  1122     History   Chief Complaint Chief Complaint  Patient presents with  . Laceration    left thumb    HPI Rexene EdisonRay C Balderston is a 61 y.o. male.  HPI   Rexene EdisonRay C Nunes is a 61 y.o. male who presents to the Emergency Department complaining of laceration to the left thumb that occurred shortly before ER arrival.  Patient states that he was cutting open a box when the injury occurred.  Bleeding was controlled with direct pressure prior to arrival.  He complains of difficulty flexing the distal end of his thumb and mild pain associated with attempting movement.  Last Td is up to date.  Denies numbness of the thumb, wrist pain, swelling or use of blood thinners.    Past Medical History:  Diagnosis Date  . Chronic abdominal pain S/P ABD. SURG FROM INJURY  . GERD (gastroesophageal reflux disease)   . Heart murmur MILD- ASYMPTOMATIC  . History of kidney stones   . Hypertension   . Left ureteral calculus   . Lung nodule 03/2012   CT scan  RIGHT MIDDLE LOBE- BENIGN    Patient Active Problem List   Diagnosis Date Noted  . Protein-calorie malnutrition (HCC) 04/25/2017  . Chronic shoulder pain 12/08/2015  . Idiopathic proctitis 07/23/2013  . Colon cancer screening 03/23/2013  . Lung nodule 03/23/2013  . HTN (hypertension), benign 09/02/2011  . Kidney stones 09/02/2011  . Chronic abdominal pain 09/02/2011    Past Surgical History:  Procedure Laterality Date  . ABDOMINAL SURGERY AND COLOSTOMY  2000   WORK INJURY FROM MACHINE  . URETEROSCOPY  July 2013   W/ stent, s/p removal for left stone       Home Medications    Prior to Admission medications   Medication Sig Start Date End Date Taking? Authorizing Provider  amLODipine (NORVASC) 10 MG tablet Take 1 tablet (10 mg total) by mouth daily. 11/18/17   Galva, Velna HatchetKawanta F, MD  clotrimazole-betamethasone (LOTRISONE) cream Apply 1  application topically 2 (two) times daily. 12/19/14   Salley Scarleturham, Kawanta F, MD  fentaNYL (DURAGESIC - DOSED MCG/HR) 25 MCG/HR patch Place 1 patch (25 mcg total) onto the skin every 3 (three) days. 11/18/17   Salley Scarleturham, Kawanta F, MD  meloxicam (MOBIC) 15 MG tablet Take 1 tablet (15 mg total) by mouth daily. 11/18/17   Salley Scarleturham, Kawanta F, MD  ranitidine (ZANTAC) 75 MG/5ML syrup TAKE 1/2 TEASPOONFUL (2.5ML) BY MOUTH TWICE DAILY. 11/18/17   Salley Scarleturham, Kawanta F, MD    Family History Family History  Problem Relation Age of Onset  . Hypertension Mother   . Hypertension Brother   . Hypertension Sister   . Hypertension Brother     Social History Social History   Tobacco Use  . Smoking status: Never Smoker  . Smokeless tobacco: Never Used  Substance Use Topics  . Alcohol use: No  . Drug use: No     Allergies   Patient has no known allergies.   Review of Systems Review of Systems  Constitutional: Negative for chills and fever.  Musculoskeletal: Negative for back pain and joint swelling.  Skin: Positive for wound. Negative for color change.       Laceration left thumb  Neurological: Negative for weakness and numbness.  Hematological: Does not bruise/bleed easily.  All other systems reviewed and are negative.    Physical Exam Updated Vital Signs BP Marland Kitchen(!)  160/80 (BP Location: Right Arm)   Pulse 61   Temp 97.7 F (36.5 C) (Oral)   Resp 16   Ht 5\' 9"  (1.753 m)   Wt 67.6 kg (149 lb)   SpO2 100%   BMI 22.00 kg/m   Physical Exam  Constitutional: He is oriented to person, place, and time. He appears well-developed and well-nourished. No distress.  HENT:  Head: Normocephalic and atraumatic.  Cardiovascular: Normal rate, regular rhythm and intact distal pulses.  Pulmonary/Chest: Effort normal and breath sounds normal. No respiratory distress.  Musculoskeletal: He exhibits no edema.       Left hand: He exhibits decreased range of motion, tenderness and laceration. He exhibits normal  two-point discrimination, normal capillary refill and no swelling. Normal sensation noted. He exhibits no wrist extension trouble.       Hands: 2 cm laceration to the distal left thumb, bleeding controlled. Pt unable to flex the thumb at the distal joint. No visible tendon injury seen.  No FB's.   No edema.    Neurological: He is alert and oriented to person, place, and time. No sensory deficit. He exhibits normal muscle tone. Coordination normal.  Skin: Skin is warm. Capillary refill takes less than 2 seconds.  Nursing note and vitals reviewed.    ED Treatments / Results  Labs (all labs ordered are listed, but only abnormal results are displayed) Labs Reviewed - No data to display  EKG  EKG Interpretation None       Radiology No results found.  Procedures Procedures (including critical care time)  LACERATION REPAIR Performed by: Jeania Nater L. Authorized by: Maxwell CaulRIPLETT,Vuk Skillern L. Consent: Verbal consent obtained. Risks and benefits: risks, benefits and alternatives were discussed Consent given by: patient Patient identity confirmed: provided demographic data Prepped and Draped in normal sterile fashion Wound explored  Laceration Location: left thumb  Laceration Length: 2 cm  Wound was explored and irrigated thoroughly with nml saline.  No Foreign Bodies seen or palpated.  No visible tendon injury.  Anesthesia: local infiltration  Local anesthetic: lidocaine 1% w/o epinephrine  Anesthetic total: 2 ml  Irrigation method: syringe Amount of cleaning: standard  Skin closure: 4-0 prolene  Number of sutures: 4  Technique: simple interrupted  Patient tolerance: Patient tolerated the procedure well with no immediate complications.   Medications Ordered in ED Medications  povidone-iodine (BETADINE) 10 % external solution (not administered)  lidocaine (PF) (XYLOCAINE) 1 % injection (not administered)     Initial Impression / Assessment and Plan / ED Course  I  have reviewed the triage vital signs and the nursing notes.  Pertinent labs & imaging results that were available during my care of the patient were reviewed by me and considered in my medical decision making (see chart for details).    Lac to left thumb, NV intact.    Consulted Dr. Dion SaucierLandau regarding pt's injury.  He advised me to refer pt to Dr. Ronie SpiesWeingold's office and I will place a note regarding such in his Epic in basket.    Thumb was bandaged and splinted by nursing   Final Clinical Impressions(s) / ED Diagnoses   Final diagnoses:  Laceration of left thumb with tendon involvement, initial encounter    ED Discharge Orders    None       Pauline Ausriplett, Tevis Conger, Cordelia Poche-C 12/14/17 1722    Mesner, Barbara CowerJason, MD 12/15/17 [phone]

## 2017-12-14 NOTE — ED Triage Notes (Signed)
Laceration to left thumb.  Trying to cut open box and knife hit left thumb.  Rates pain 4/10, bleeding controlled.

## 2017-12-15 DIAGNOSIS — S66822A Laceration of other specified muscles, fascia and tendons at wrist and hand level, left hand, initial encounter: Secondary | ICD-10-CM | POA: Diagnosis not present

## 2017-12-15 DIAGNOSIS — S6432XA Injury of digital nerve of left thumb, initial encounter: Secondary | ICD-10-CM | POA: Diagnosis not present

## 2017-12-15 DIAGNOSIS — S61409A Unspecified open wound of unspecified hand, initial encounter: Secondary | ICD-10-CM | POA: Diagnosis not present

## 2017-12-16 DIAGNOSIS — S66022A Laceration of long flexor muscle, fascia and tendon of left thumb at wrist and hand level, initial encounter: Secondary | ICD-10-CM | POA: Diagnosis not present

## 2017-12-16 DIAGNOSIS — S61012A Laceration without foreign body of left thumb without damage to nail, initial encounter: Secondary | ICD-10-CM | POA: Diagnosis not present

## 2017-12-16 DIAGNOSIS — S65412A Laceration of blood vessel of left thumb, initial encounter: Secondary | ICD-10-CM | POA: Diagnosis not present

## 2017-12-16 DIAGNOSIS — S6432XA Injury of digital nerve of left thumb, initial encounter: Secondary | ICD-10-CM | POA: Diagnosis not present

## 2017-12-22 DIAGNOSIS — M79645 Pain in left finger(s): Secondary | ICD-10-CM | POA: Diagnosis not present

## 2017-12-26 ENCOUNTER — Other Ambulatory Visit: Payer: Self-pay | Admitting: *Deleted

## 2017-12-26 MED ORDER — FENTANYL 25 MCG/HR TD PT72: 25.0000 ug | MEDICATED_PATCH | TRANSDERMAL | 0 refills | Status: DC

## 2017-12-26 NOTE — Telephone Encounter (Signed)
Call placed to patient to make aware. LMTRC. 

## 2017-12-26 NOTE — Telephone Encounter (Signed)
Patient returned call and made aware.

## 2017-12-26 NOTE — Telephone Encounter (Signed)
Received call from patient.   Requested refill on Fentanyl Patches.   Ok to refill??  Last office visit/ refill 11/18/2017.

## 2018-01-02 DIAGNOSIS — M25642 Stiffness of left hand, not elsewhere classified: Secondary | ICD-10-CM | POA: Diagnosis not present

## 2018-01-11 DIAGNOSIS — M25642 Stiffness of left hand, not elsewhere classified: Secondary | ICD-10-CM | POA: Diagnosis not present

## 2018-01-18 DIAGNOSIS — M25642 Stiffness of left hand, not elsewhere classified: Secondary | ICD-10-CM | POA: Diagnosis not present

## 2018-01-18 DIAGNOSIS — M25632 Stiffness of left wrist, not elsewhere classified: Secondary | ICD-10-CM | POA: Diagnosis not present

## 2018-01-25 ENCOUNTER — Other Ambulatory Visit: Payer: Self-pay | Admitting: *Deleted

## 2018-01-25 MED ORDER — FENTANYL 25 MCG/HR TD PT72: 25.0000 ug | MEDICATED_PATCH | TRANSDERMAL | 0 refills | Status: DC

## 2018-01-25 NOTE — Telephone Encounter (Signed)
Received call from patient.   Requested refill on Fentanyl patches.   Ok to refill??  Last office visit 11/18/2017.  Last refill 12/26/2017.

## 2018-02-01 DIAGNOSIS — M25642 Stiffness of left hand, not elsewhere classified: Secondary | ICD-10-CM | POA: Diagnosis not present

## 2018-02-01 DIAGNOSIS — M25632 Stiffness of left wrist, not elsewhere classified: Secondary | ICD-10-CM | POA: Diagnosis not present

## 2018-02-06 DIAGNOSIS — M25642 Stiffness of left hand, not elsewhere classified: Secondary | ICD-10-CM | POA: Diagnosis not present

## 2018-02-06 DIAGNOSIS — M25632 Stiffness of left wrist, not elsewhere classified: Secondary | ICD-10-CM | POA: Diagnosis not present

## 2018-02-08 DIAGNOSIS — M25642 Stiffness of left hand, not elsewhere classified: Secondary | ICD-10-CM | POA: Diagnosis not present

## 2018-02-16 DIAGNOSIS — M25632 Stiffness of left wrist, not elsewhere classified: Secondary | ICD-10-CM | POA: Diagnosis not present

## 2018-02-16 DIAGNOSIS — M25642 Stiffness of left hand, not elsewhere classified: Secondary | ICD-10-CM | POA: Diagnosis not present

## 2018-02-21 DIAGNOSIS — M25632 Stiffness of left wrist, not elsewhere classified: Secondary | ICD-10-CM | POA: Diagnosis not present

## 2018-02-21 DIAGNOSIS — M25642 Stiffness of left hand, not elsewhere classified: Secondary | ICD-10-CM | POA: Diagnosis not present

## 2018-02-23 DIAGNOSIS — M25642 Stiffness of left hand, not elsewhere classified: Secondary | ICD-10-CM | POA: Diagnosis not present

## 2018-02-23 DIAGNOSIS — M25632 Stiffness of left wrist, not elsewhere classified: Secondary | ICD-10-CM | POA: Diagnosis not present

## 2018-02-27 ENCOUNTER — Other Ambulatory Visit: Payer: Self-pay | Admitting: *Deleted

## 2018-02-27 MED ORDER — FENTANYL 25 MCG/HR TD PT72: 25.0000 ug | MEDICATED_PATCH | TRANSDERMAL | 0 refills | Status: DC

## 2018-02-27 NOTE — Telephone Encounter (Signed)
Received call from patient.   Requested refill on Fentanyl.   Ok to refill??  Last office visit 11/18/2017.  Last refill 01/25/2018.

## 2018-03-02 DIAGNOSIS — Z933 Colostomy status: Secondary | ICD-10-CM | POA: Diagnosis not present

## 2018-03-23 DIAGNOSIS — S66822A Laceration of other specified muscles, fascia and tendons at wrist and hand level, left hand, initial encounter: Secondary | ICD-10-CM | POA: Diagnosis not present

## 2018-03-23 DIAGNOSIS — S61402A Unspecified open wound of left hand, initial encounter: Secondary | ICD-10-CM | POA: Diagnosis not present

## 2018-03-23 DIAGNOSIS — S6432XA Injury of digital nerve of left thumb, initial encounter: Secondary | ICD-10-CM | POA: Diagnosis not present

## 2018-03-29 ENCOUNTER — Other Ambulatory Visit: Payer: Self-pay | Admitting: *Deleted

## 2018-03-29 MED ORDER — FENTANYL 25 MCG/HR TD PT72: 25.0000 ug | MEDICATED_PATCH | TRANSDERMAL | 0 refills | Status: DC

## 2018-03-29 NOTE — Telephone Encounter (Signed)
Received call from patient.   Requested refill on Fentanyl.   Ok to refill??  Last office visit 11/18/2017.  Last refill 02/27/2018.

## 2018-04-18 DIAGNOSIS — Z933 Colostomy status: Secondary | ICD-10-CM | POA: Diagnosis not present

## 2018-04-27 ENCOUNTER — Other Ambulatory Visit: Payer: Self-pay | Admitting: *Deleted

## 2018-04-27 NOTE — Telephone Encounter (Signed)
Received call from patient.   Requested refill on Fentanyl.   Ok to refill??  Last office visit 11/18/2017.  Last refill 03/29/2018.

## 2018-04-28 MED ORDER — FENTANYL 25 MCG/HR TD PT72: 25.0000 ug | MEDICATED_PATCH | TRANSDERMAL | 0 refills | Status: DC

## 2018-05-04 DIAGNOSIS — S6432XA Injury of digital nerve of left thumb, initial encounter: Secondary | ICD-10-CM | POA: Diagnosis not present

## 2018-05-04 DIAGNOSIS — S61402A Unspecified open wound of left hand, initial encounter: Secondary | ICD-10-CM | POA: Diagnosis not present

## 2018-05-04 DIAGNOSIS — S66822A Laceration of other specified muscles, fascia and tendons at wrist and hand level, left hand, initial encounter: Secondary | ICD-10-CM | POA: Diagnosis not present

## 2018-05-17 ENCOUNTER — Encounter: Payer: Self-pay | Admitting: Family Medicine

## 2018-05-17 ENCOUNTER — Other Ambulatory Visit: Payer: Self-pay

## 2018-05-17 ENCOUNTER — Ambulatory Visit (INDEPENDENT_AMBULATORY_CARE_PROVIDER_SITE_OTHER): Payer: 59 | Admitting: Family Medicine

## 2018-05-17 VITALS — BP 130/68 | HR 64 | Temp 98.3°F | Resp 14 | Ht 70.0 in | Wt 148.0 lb

## 2018-05-17 DIAGNOSIS — Z1211 Encounter for screening for malignant neoplasm of colon: Secondary | ICD-10-CM | POA: Diagnosis not present

## 2018-05-17 DIAGNOSIS — I10 Essential (primary) hypertension: Secondary | ICD-10-CM | POA: Diagnosis not present

## 2018-05-17 DIAGNOSIS — R109 Unspecified abdominal pain: Secondary | ICD-10-CM | POA: Diagnosis not present

## 2018-05-17 DIAGNOSIS — G8929 Other chronic pain: Secondary | ICD-10-CM

## 2018-05-17 DIAGNOSIS — Z125 Encounter for screening for malignant neoplasm of prostate: Secondary | ICD-10-CM | POA: Diagnosis not present

## 2018-05-17 DIAGNOSIS — Z0000 Encounter for general adult medical examination without abnormal findings: Secondary | ICD-10-CM

## 2018-05-17 MED ORDER — HYDROCHLOROTHIAZIDE 25 MG PO TABS: 25.0000 mg | ORAL_TABLET | Freq: Every day | ORAL | 3 refills | Status: DC

## 2018-05-17 NOTE — Progress Notes (Signed)
   Subjective:    Patient ID: Johnathan Baldwin, male    DOB: 1956/06/23, 62 y.o.   MRN: [id number]  Patient presents for CPE (is not fasting) and Edema (BLE edema- intermittent selling to B legs that worsens throughout the day and resolves at night)  Pt here for CPE  Due for colonoscopy , has partial colonoscopy in  2014 at [facility] in [facility] ,due to his colostomy 2/2 trauma years ago   PSA due  Immunizations- UTD  Leg swelling for lower legs  for past couple of weeks, has has in the past, mostly occurs after being up and moving around, goes after he lays down or props feet up. Has not noticed with exercising  No SOB/ chest pain/ no recent illness In past given HCTZ when this occurred He has been eating a few more salty foods, drinking water Colostomy output looks good    Follows with dentist   Overdue for eye appointment    Review Of Systems:  GEN- denies fatigue, fever, weight loss,weakness, recent illness HEENT- denies eye drainage, change in vision, nasal discharge, CVS- denies chest pain, palpitations RESP- denies SOB, cough, wheeze ABD- denies N/V, change in stools, abd pain GU- denies dysuria, hematuria, dribbling, incontinence MSK- denies joint pain, muscle aches, injury Neuro- denies headache, dizziness, syncope, seizure activity       Objective:    BP 130/68   Pulse 64   Temp 98.3 F (36.8 C) (Oral)   Resp 14   Ht  (1.778 m)   Wt 148 lb (67.1 kg)   SpO2 98%   BMI 21.24 kg/m  GEN- NAD, alert and oriented x3 HEENT- PERRL, EOMI, non injected sclera, pink conjunctiva, MMM, oropharynx clear Neck- Supple, no thyromegaly, no JVD CVS- RRR, no murmur RESP-CTAB ABD-NABS,soft,NT,ND,colostomy in tact , old surgical wounds abdomen EXT- pitting edema to mid shins approx 1+ Pulses- Radial, DP- 2+        Assessment & Plan:      Problem List Items Addressed This Visit      Unprioritized   HTN (hypertension), benign - Primary   Controlled but with leg swelling D/c norvasc, start HCTZ  once a day  Recheck in 2 weeks Fasting labs for CPE      Relevant Medications   hydrochlorothiazide (HYDRODIURIL) 25 MG tablet   Other Relevant Orders   Comprehensive metabolic panel (Completed)   Lipid panel (Completed)   Colon cancer screening    Referral to GI      Chronic abdominal pain    Continue fenatayl patch, traumatic abdominal wounds with chronic colostomy       Other Visit Diagnoses    Routine general medical examination at a health care facility       immunizations UTD   Relevant Orders   CBC with Differential/Platelet (Completed)   Comprehensive metabolic panel (Completed)   Lipid panel (Completed)   Prostate cancer screening       Relevant Orders   PSA (Completed)      Note: This dictation was prepared with Dragon dictation along with smaller phrase technology. Any transcriptional errors that result from this process are unintentional.

## 2018-05-17 NOTE — Patient Instructions (Addendum)
Take the HCTZ tablet once a day  Stop the norvasc  Colonoscopy referral  We will call with lab results  F/U 2 weeks

## 2018-05-18 ENCOUNTER — Encounter: Payer: Self-pay | Admitting: *Deleted

## 2018-05-18 ENCOUNTER — Encounter: Payer: Self-pay | Admitting: Family Medicine

## 2018-05-18 LAB — COMPREHENSIVE METABOLIC PANEL
AG RATIO: 1.3 (calc) (ref 1.0–2.5)
ALKALINE PHOSPHATASE (APISO): 85 U/L (ref 40–115)
ALT: 15 U/L (ref 9–46)
AST: 23 U/L (ref 10–35)
Albumin: 4.3 g/dL (ref 3.6–5.1)
BUN: 14 mg/dL (ref 7–25)
CHLORIDE: 103 mmol/L (ref 98–110)
CO2: 27 mmol/L (ref 20–32)
Calcium: 9.3 mg/dL (ref 8.6–10.3)
Creat: 1.2 mg/dL (ref 0.70–1.33)
GLOBULIN: 3.3 g/dL (ref 1.9–3.7)
Glucose, Bld: 95 mg/dL (ref 65–99)
Potassium: 4.4 mmol/L (ref 3.5–5.3)
Sodium: 137 mmol/L (ref 135–146)
Total Bilirubin: 0.8 mg/dL (ref 0.2–1.2)
Total Protein: 7.6 g/dL (ref 6.1–8.1)

## 2018-05-18 LAB — CBC WITH DIFFERENTIAL/PLATELET
BASOS PCT: 1 %
Basophils Absolute: 41 cells/uL (ref 0–200)
Eosinophils Absolute: 410 cells/uL (ref 15–500)
Eosinophils Relative: 10 %
HCT: 38.5 % (ref 38.5–50.0)
HEMOGLOBIN: 13.4 g/dL (ref 13.2–17.1)
Lymphs Abs: 1226 cells/uL (ref 850–3900)
MCH: 31.8 pg (ref 27.0–33.0)
MCHC: 34.8 g/dL (ref 32.0–36.0)
MCV: 91.4 fL (ref 80.0–100.0)
MPV: 10.1 fL (ref 7.5–12.5)
Monocytes Relative: 11.7 %
NEUTROS ABS: 1943 {cells}/uL (ref 1500–7800)
Neutrophils Relative %: 47.4 %
Platelets: 330 10*3/uL (ref 140–400)
RBC: 4.21 10*6/uL (ref 4.20–5.80)
RDW: 12 % (ref 11.0–15.0)
Total Lymphocyte: 29.9 %
WBC: 4.1 10*3/uL (ref 3.8–10.8)
WBCMIX: 480 {cells}/uL (ref 200–950)

## 2018-05-18 LAB — LIPID PANEL
CHOLESTEROL: 203 mg/dL — AB (ref <200)
HDL: 108 mg/dL (ref >40)
LDL Cholesterol (Calc): 80 mg/dL (calc)
Non-HDL Cholesterol (Calc): 95 mg/dL (calc) (ref <130)
Total CHOL/HDL Ratio: 1.9 (calc) (ref <5.0)
Triglycerides: 66 mg/dL (ref <150)

## 2018-05-18 LAB — PSA: PSA: 0.4 ng/mL (ref <=4.0)

## 2018-05-18 NOTE — Assessment & Plan Note (Signed)
Referral to GI  

## 2018-05-18 NOTE — Assessment & Plan Note (Addendum)
Controlled but with leg swelling D/c norvasc, start HCTZ  once a day  Recheck in 2 weeks Fasting labs for CPE

## 2018-05-18 NOTE — Assessment & Plan Note (Signed)
Continue fenatayl patch, traumatic abdominal wounds with chronic colostomy

## 2018-05-24 ENCOUNTER — Other Ambulatory Visit: Payer: Self-pay | Admitting: Family Medicine

## 2018-05-29 ENCOUNTER — Other Ambulatory Visit: Payer: Self-pay | Admitting: *Deleted

## 2018-05-29 MED ORDER — FENTANYL 25 MCG/HR TD PT72: 25.0000 ug | MEDICATED_PATCH | TRANSDERMAL | 0 refills | Status: DC

## 2018-05-29 NOTE — Telephone Encounter (Signed)
Received call from patient.   Requested refill on Fentanyl patches.   Ok to refill??  Last office visit 05/17/2018.  Last refill 04/28/2018.

## 2018-05-31 ENCOUNTER — Other Ambulatory Visit: Payer: Self-pay

## 2018-05-31 ENCOUNTER — Ambulatory Visit: Payer: 59 | Admitting: Family Medicine

## 2018-05-31 ENCOUNTER — Encounter: Payer: Self-pay | Admitting: Family Medicine

## 2018-05-31 VITALS — BP 128/72 | HR 60 | Temp 97.9°F | Resp 14 | Ht 70.0 in | Wt 148.0 lb

## 2018-05-31 DIAGNOSIS — I10 Essential (primary) hypertension: Secondary | ICD-10-CM

## 2018-05-31 DIAGNOSIS — R609 Edema, unspecified: Secondary | ICD-10-CM | POA: Diagnosis not present

## 2018-05-31 NOTE — Patient Instructions (Signed)
F/U 6 months December for medications

## 2018-05-31 NOTE — Progress Notes (Signed)
   Subjective:    Patient ID: Johnathan Baldwin, male    DOB: September 17, 1956, 62 y.o.   MRN: [id number]  Patient presents for Follow-up (is not fasting)  Pt here for intermin f/u leg swelling, norvasc was discontined and he was put on HCTZ   Leg swelling has improved, no problems with HCTZ Exercising normally, no SOB, no chest pain     Review Of Systems:  GEN- denies fatigue, fever, weight loss,weakness, recent illness HEENT- denies eye drainage, change in vision, nasal discharge, CVS- denies chest pain, palpitations RESP- denies SOB, cough, wheeze ABD- denies N/V, change in stools, abd pain GU- denies dysuria, hematuria, dribbling, incontinence MSK- denies joint pain, muscle aches, injury Neuro- denies headache, dizziness, syncope, seizure activity       Objective:    BP 128/72   Pulse 60   Temp 97.9 F (36.6 C) (Oral)   Resp 14   Ht 5\' 10"  (1.778 m)   Wt 148 lb (67.1 kg)   SpO2 99%   BMI 21.24 kg/m  GEN- NAD, alert and oriented x3 HEENT- PERRL, EOMI, non injected sclera, pink conjunctiva, MMM, oropharynx clear Neck- Supple, no JVD  CVS- RRR, no murmur RESP-CTAB ABD-NABS,soft,NT,ND, colostomy in tact  EXT- No edema Pulses- Radial  2+        Assessment & Plan:      Problem List Items Addressed This Visit      Unprioritized   HTN (hypertension), benign    Controlled with HCTZ, Norvasc may have caused the edema Edema resolved        Other Visit Diagnoses    Peripheral edema    -  Primary      Note: This dictation was prepared with Dragon dictation along with smaller phrase technology. Any transcriptional errors that result from this process are unintentional.

## 2018-05-31 NOTE — Assessment & Plan Note (Signed)
Controlled with HCTZ, Norvasc may have caused the edema Edema resolved

## 2018-06-15 DIAGNOSIS — Z933 Colostomy status: Secondary | ICD-10-CM | POA: Diagnosis not present

## 2018-06-26 ENCOUNTER — Other Ambulatory Visit: Payer: Self-pay | Admitting: *Deleted

## 2018-06-26 NOTE — Telephone Encounter (Signed)
Received call from patient.   Requested refill on Fentanyl.   Ok to refill??  Last office visit 05/31/2018.  Last refill 05/29/2018.

## 2018-06-27 MED ORDER — FENTANYL 25 MCG/HR TD PT72: 25.0000 ug | MEDICATED_PATCH | TRANSDERMAL | 0 refills | Status: DC

## 2018-07-26 ENCOUNTER — Other Ambulatory Visit: Payer: Self-pay | Admitting: *Deleted

## 2018-07-26 MED ORDER — FENTANYL 25 MCG/HR TD PT72: 25.0000 ug | MEDICATED_PATCH | TRANSDERMAL | 0 refills | Status: DC

## 2018-07-26 NOTE — Telephone Encounter (Signed)
Received call from patient.   Requested refill on Fentanyl.   Ok to refill??  Last office visit 05/31/2018.  Last refill 06/27/2018.

## 2018-07-28 ENCOUNTER — Other Ambulatory Visit: Payer: Self-pay | Admitting: Family Medicine

## 2018-08-07 DIAGNOSIS — S6432XA Injury of digital nerve of left thumb, initial encounter: Secondary | ICD-10-CM | POA: Diagnosis not present

## 2018-08-07 DIAGNOSIS — S61402A Unspecified open wound of left hand, initial encounter: Secondary | ICD-10-CM | POA: Diagnosis not present

## 2018-08-07 DIAGNOSIS — S66822A Laceration of other specified muscles, fascia and tendons at wrist and hand level, left hand, initial encounter: Secondary | ICD-10-CM | POA: Diagnosis not present

## 2018-08-16 ENCOUNTER — Other Ambulatory Visit: Payer: Self-pay | Admitting: Family Medicine

## 2018-08-25 ENCOUNTER — Other Ambulatory Visit: Payer: Self-pay | Admitting: Family Medicine

## 2018-08-25 MED ORDER — FENTANYL 25 MCG/HR TD PT72: 25.0000 ug | MEDICATED_PATCH | TRANSDERMAL | 0 refills | Status: DC

## 2018-08-25 NOTE — Telephone Encounter (Signed)
Ok to refill??  Last office visit 05/31/2018.  Last refill 07/26/2018.

## 2018-08-25 NOTE — Telephone Encounter (Signed)
Patient calling for refill on his fentanyl patch  Please sent to [facility]

## 2018-09-25 ENCOUNTER — Other Ambulatory Visit: Payer: Self-pay | Admitting: *Deleted

## 2018-09-25 ENCOUNTER — Other Ambulatory Visit: Payer: Self-pay | Admitting: Family Medicine

## 2018-09-25 MED ORDER — FENTANYL 25 MCG/HR TD PT72: 25.0000 ug | MEDICATED_PATCH | TRANSDERMAL | 0 refills | Status: DC

## 2018-09-25 NOTE — Telephone Encounter (Signed)
Received call from patient.   Requested refill on Fentanyl.   Ok to refill??  Last office visit 05/31/2018.  Last refill 08/25/2018.

## 2018-10-18 ENCOUNTER — Other Ambulatory Visit: Payer: Self-pay | Admitting: Family Medicine

## 2018-10-24 ENCOUNTER — Other Ambulatory Visit: Payer: Self-pay | Admitting: *Deleted

## 2018-10-24 NOTE — Telephone Encounter (Signed)
Received call from patient.   Requested refill on Fentanyl.   Ok to refill??  Last office visit 05/31/2018.  Last refill 09/25/2018.

## 2018-10-25 MED ORDER — FENTANYL 25 MCG/HR TD PT72: 25.0000 ug | MEDICATED_PATCH | TRANSDERMAL | 0 refills | Status: DC

## 2018-11-02 ENCOUNTER — Other Ambulatory Visit: Payer: Self-pay | Admitting: Family Medicine

## 2018-11-23 ENCOUNTER — Other Ambulatory Visit: Payer: Self-pay | Admitting: *Deleted

## 2018-11-23 NOTE — Telephone Encounter (Signed)
Received call from patient.   Requested refill on Duragesic.   Ok to refill??  Last office visit 05/31/2018.  Last refill 10/25/2018.

## 2018-11-24 MED ORDER — FENTANYL 25 MCG/HR TD PT72: 25.0000 ug | MEDICATED_PATCH | TRANSDERMAL | 0 refills | Status: DC

## 2018-12-01 ENCOUNTER — Ambulatory Visit: Payer: 59 | Admitting: Family Medicine

## 2018-12-01 ENCOUNTER — Other Ambulatory Visit: Payer: Self-pay

## 2018-12-01 ENCOUNTER — Encounter: Payer: Self-pay | Admitting: Family Medicine

## 2018-12-01 VITALS — BP 140/88 | HR 54 | Temp 97.9°F | Resp 14 | Ht 70.0 in | Wt 146.0 lb

## 2018-12-01 DIAGNOSIS — R2241 Localized swelling, mass and lump, right lower limb: Secondary | ICD-10-CM | POA: Diagnosis not present

## 2018-12-01 DIAGNOSIS — I10 Essential (primary) hypertension: Secondary | ICD-10-CM

## 2018-12-01 DIAGNOSIS — R109 Unspecified abdominal pain: Secondary | ICD-10-CM

## 2018-12-01 DIAGNOSIS — M7989 Other specified soft tissue disorders: Secondary | ICD-10-CM | POA: Diagnosis not present

## 2018-12-01 DIAGNOSIS — G8929 Other chronic pain: Secondary | ICD-10-CM

## 2018-12-01 MED ORDER — RANITIDINE HCL 75 MG/5ML PO SYRP: ORAL_SOLUTION | ORAL | 6 refills | Status: DC

## 2018-12-01 NOTE — Addendum Note (Signed)
Addended by: Milinda AntisURHAM, Kedarius Aloisi F on: 12/01/2018 02:07 PM   Modules accepted: Orders

## 2018-12-01 NOTE — Assessment & Plan Note (Signed)
Mild elevation today, has been well controlled No change to HCTZ Pt will monitor at home Lipids normal from 6 months ago

## 2018-12-01 NOTE — Assessment & Plan Note (Signed)
Continue Fentanyl patches

## 2018-12-01 NOTE — Progress Notes (Addendum)
   Subjective:    Patient ID: Johnathan Baldwin, male    DOB: December 11, 1956, 62 y.o.   MRN: [id number]  Patient presents for Follow-up (is fasting) and Knot to R Knee (x weeks- no pain to area, but would like it looked at)   Pt here to f/u chronic medical problems.   He has a knot on side of his right knee for a few months, he thinks knot has become bigger. He has not had any swelling of the entire knee. No difficulty or pain with walking or exercise    Chronic pain-  Has colostomy from old injury, he is on chronic fentayl patches, no concerns, pain is controlled. Also on meloxicam due to joint pain/shoulder injuries  GERD- still on liquid zantac   HTN- taking HCTZ  for BP, typically well controlled, no side effects from the medication  Declines flu shot    Review Of Systems:  GEN- denies fatigue, fever, weight loss,weakness, recent illness HEENT- denies eye drainage, change in vision, nasal discharge, CVS- denies chest pain, palpitations RESP- denies SOB, cough, wheeze ABD- denies N/V, change in stools, abd pain GU- denies dysuria, hematuria, dribbling, incontinence MSK- denies joint pain, muscle aches, injury Neuro- denies headache, dizziness, syncope, seizure activity       Objective:    BP 140/88   Pulse (!) 54   Temp 97.9 F (36.6 C) (Oral)   Resp 14   Ht 5\' 10"  (1.778 m)   Wt 146 lb (66.2 kg)   SpO2 99%   BMI 20.95 kg/m  GEN- NAD, alert and oriented x3 HEENT- PERRL, EOMI, non injected sclera, pink conjunctiva, MMM, oropharynx clear Neck- Supple, no thyromegaly CVS- RRR, no murmur RESP-CTAB ABD-NABS,soft,NT,ND, colostomy in tact  EXT- No edema MSK- Right leg- FROM knee, no effusion,   inferior aspect of inner thigh, bulge - golf ball size, noticeable with standing and extension of leg, non tender, no warmth, no fluctance  Pulses- Radial, DP- 2+        Assessment & Plan:      Problem List Items Addressed This Visit      Unprioritized   Chronic abdominal  pain    Continue Fentanyl patches       HTN (hypertension), benign    Mild elevation today, has been well controlled No change to HCTZ Pt will monitor at home Lipids normal from 6 months ago       Other Visit Diagnoses    Leg mass, right    -  Primary   concern for possible muscle rupture that is bulged or partial tear, no sign of abcess, possible lipoma or other soft tissue tumor. no pain with walking/activites Obtain CT of leg to evaluate      Note: This dictation was prepared with Dragon dictation along with smaller phrase technology. Any transcriptional errors that result from this process are unintentional.

## 2018-12-01 NOTE — Patient Instructions (Addendum)
Continue current medications Watch your blood pressure CT scan of leg for the mass  Medications refilled F/U 6 months for Physical

## 2018-12-14 ENCOUNTER — Ambulatory Visit (HOSPITAL_COMMUNITY): Payer: 59

## 2018-12-18 ENCOUNTER — Telehealth: Payer: Self-pay | Admitting: Family Medicine

## 2018-12-18 DIAGNOSIS — R2241 Localized swelling, mass and lump, right lower limb: Secondary | ICD-10-CM

## 2018-12-18 NOTE — Telephone Encounter (Signed)
-----   Message from Andee LinemanJennifer B Cooper sent at 12/15/2018  1:07 PM EST ----- Regarding: Change of order per Radiologist Good afternoon, I was looking ahead at the CT scheduled and noticed that the above patient is scheduled for a CT Femur. We always discuss CT extremities with radiologists before proceeding, especially if it concerns muscle. I talked with Dr. Grace IsaacWatts about this patient and his recommendations are an MRI instead of a CT. MRI's are better at detecting issues with muscle/tears/soft tissue. If you would like to speak to a radiologist please call [phone].  Thanks, Wilmer FloorJennifer Cooper  [facility] CT Supervisor

## 2018-12-18 NOTE — Telephone Encounter (Signed)
Cancel CT of leg, Radiologist recommended MRI due to soft tissue or muscular mass

## 2018-12-22 ENCOUNTER — Telehealth: Payer: Self-pay | Admitting: Family Medicine

## 2018-12-22 MED ORDER — HYDROCHLOROTHIAZIDE 25 MG PO TABS: 25.0000 mg | ORAL_TABLET | Freq: Every day | ORAL | 2 refills | Status: DC

## 2018-12-22 NOTE — Telephone Encounter (Signed)
Prescription sent to pharmacy.

## 2018-12-22 NOTE — Telephone Encounter (Signed)
CB# [phone]  Patient requesting a refill on his hydrochlorothiazide called into [facility]

## 2018-12-25 ENCOUNTER — Ambulatory Visit (HOSPITAL_COMMUNITY)
Admission: RE | Admit: 2018-12-25 | Discharge: 2018-12-25 | Disposition: A | Discharge status: Home / Self Care | Payer: 59 | Source: Ambulatory Visit | Attending: Family Medicine | Admitting: Family Medicine

## 2018-12-25 ENCOUNTER — Other Ambulatory Visit: Payer: Self-pay | Admitting: *Deleted

## 2018-12-25 ENCOUNTER — Ambulatory Visit (HOSPITAL_COMMUNITY): Admission: RE | Admit: 2018-12-25 | Payer: 59 | Source: Ambulatory Visit

## 2018-12-25 DIAGNOSIS — R2241 Localized swelling, mass and lump, right lower limb: Secondary | ICD-10-CM | POA: Diagnosis present

## 2018-12-25 MED ORDER — FENTANYL 25 MCG/HR TD PT72: 25.0000 ug | MEDICATED_PATCH | TRANSDERMAL | 0 refills | Status: DC

## 2018-12-25 NOTE — Telephone Encounter (Signed)
Received call from patient.   Requested refill on Fentanyl.   Ok to refill??  Last office visit 12/01/2018.  Last refill 11/24/2018.

## 2019-01-04 DIAGNOSIS — Z933 Colostomy status: Secondary | ICD-10-CM | POA: Diagnosis not present

## 2019-01-23 ENCOUNTER — Other Ambulatory Visit: Payer: Self-pay | Admitting: *Deleted

## 2019-01-23 MED ORDER — FENTANYL 25 MCG/HR TD PT72: 1.0000 | MEDICATED_PATCH | TRANSDERMAL | 0 refills | Status: DC

## 2019-01-23 NOTE — Telephone Encounter (Signed)
Received call from patient.   Requested refill on Duragesic.  Ok to refill??  Last office visit 12/01/2018.  Last refill 12/25/2018.

## 2019-02-12 DIAGNOSIS — Z933 Colostomy status: Secondary | ICD-10-CM | POA: Diagnosis not present

## 2019-02-23 ENCOUNTER — Other Ambulatory Visit: Payer: Self-pay | Admitting: Family Medicine

## 2019-02-23 ENCOUNTER — Other Ambulatory Visit: Payer: Self-pay | Admitting: *Deleted

## 2019-02-23 MED ORDER — FENTANYL 25 MCG/HR TD PT72: 1.0000 | MEDICATED_PATCH | TRANSDERMAL | 0 refills | Status: DC

## 2019-02-23 NOTE — Telephone Encounter (Signed)
Received call from patient.   Requested refill on Fentanyl Patch.   Ok to refill??  Last office visit 12/01/2018.  Last refill 01/23/2019.

## 2019-03-22 ENCOUNTER — Other Ambulatory Visit: Payer: Self-pay | Admitting: *Deleted

## 2019-03-22 NOTE — Telephone Encounter (Signed)
Received call from patient.   Requested refill on Fentanyl.   Ok to refill??  Last office visit 12/01/2018.  Last refill 02/23/2019.

## 2019-03-23 MED ORDER — FENTANYL 25 MCG/HR TD PT72: 1.0000 | MEDICATED_PATCH | TRANSDERMAL | 0 refills | Status: DC

## 2019-04-25 ENCOUNTER — Other Ambulatory Visit: Payer: Self-pay | Admitting: *Deleted

## 2019-04-25 DIAGNOSIS — Z933 Colostomy status: Secondary | ICD-10-CM | POA: Diagnosis not present

## 2019-04-25 MED ORDER — FENTANYL 25 MCG/HR TD PT72: 1.0000 | MEDICATED_PATCH | TRANSDERMAL | 0 refills | Status: DC

## 2019-04-25 NOTE — Telephone Encounter (Signed)
Received call from patient.   Requested refill on Duragesic.   Last office visit 12/01/2018.  Last refill 03/23/2019.

## 2019-04-26 ENCOUNTER — Other Ambulatory Visit: Payer: Self-pay | Admitting: Family Medicine

## 2019-05-25 ENCOUNTER — Other Ambulatory Visit: Payer: Self-pay

## 2019-05-25 MED ORDER — FENTANYL 25 MCG/HR TD PT72: 1.0000 | MEDICATED_PATCH | TRANSDERMAL | 0 refills | Status: DC

## 2019-05-25 NOTE — Telephone Encounter (Signed)
Requested Prescriptions   Pending Prescriptions Disp Refills  . fentaNYL (DURAGESIC) 25 MCG/HR 10 patch 0    Sig: Place 1 patch onto the skin every 3 (three) days.   Last OV 12/01/2018 Last written 04/25/2019

## 2019-05-29 ENCOUNTER — Other Ambulatory Visit: Payer: Self-pay

## 2019-05-29 ENCOUNTER — Encounter: Payer: Self-pay | Admitting: Family Medicine

## 2019-05-29 ENCOUNTER — Ambulatory Visit (INDEPENDENT_AMBULATORY_CARE_PROVIDER_SITE_OTHER): Payer: 59 | Admitting: Family Medicine

## 2019-05-29 VITALS — BP 136/78 | HR 77 | Temp 97.8°F | Resp 18 | Ht 69.0 in | Wt 148.2 lb

## 2019-05-29 DIAGNOSIS — Z125 Encounter for screening for malignant neoplasm of prostate: Secondary | ICD-10-CM | POA: Diagnosis not present

## 2019-05-29 DIAGNOSIS — K219 Gastro-esophageal reflux disease without esophagitis: Secondary | ICD-10-CM

## 2019-05-29 DIAGNOSIS — Z0000 Encounter for general adult medical examination without abnormal findings: Secondary | ICD-10-CM

## 2019-05-29 DIAGNOSIS — Z0001 Encounter for general adult medical examination with abnormal findings: Secondary | ICD-10-CM | POA: Diagnosis not present

## 2019-05-29 DIAGNOSIS — E441 Mild protein-calorie malnutrition: Secondary | ICD-10-CM

## 2019-05-29 DIAGNOSIS — Z1211 Encounter for screening for malignant neoplasm of colon: Secondary | ICD-10-CM

## 2019-05-29 DIAGNOSIS — I10 Essential (primary) hypertension: Secondary | ICD-10-CM | POA: Diagnosis not present

## 2019-05-29 DIAGNOSIS — Z933 Colostomy status: Secondary | ICD-10-CM | POA: Insufficient documentation

## 2019-05-29 MED ORDER — FAMOTIDINE 40 MG/5ML PO SUSR: 20.0000 mg | Freq: Two times a day (BID) | ORAL | 2 refills | Status: DC

## 2019-05-29 NOTE — Patient Instructions (Signed)
Try pepcid liquid Continue all there medications Referral to GI for colonoscopy F/U 6 months

## 2019-05-29 NOTE — Progress Notes (Signed)
   Subjective:    Patient ID: Johnathan Baldwin, male    DOB: [birth year], 63 y.o.   MRN: [id number]  Patient presents for Annual Exam  Pt here for CPE, medications reviewed  Chronic pain- using fentanyl every 3 days GERD- has been on zantac for years, now off the market, he had stomach pain after eating a lot of meat, constipated , took laxative and bowels moved and no problems since   HCTZ- no swelling , no difficulty with BP   Immunizations- UTD, shingrix and TDAP done   Due for colonoscopy   Due for PSA screening   Review Of Systems:  GEN- denies fatigue, fever, weight loss,weakness, recent illness HEENT- denies eye drainage, change in vision, nasal discharge, CVS- denies chest pain, palpitations RESP- denies SOB, cough, wheeze ABD- denies N/V, change in stools, abd pain GU- denies dysuria, hematuria, dribbling, incontinence MSK- denies joint pain, muscle aches, injury Neuro- denies headache, dizziness, syncope, seizure activity       Objective:    BP 136/78   Pulse 77   Temp 97.8 F (36.6 C)   Resp 18   Ht 5\' 9"  (1.753 m)   Wt 148 lb 3.2 oz (67.2 kg)   SpO2 98%   BMI 21.89 kg/m  GEN- NAD, alert and oriented x3 HEENT- PERRL, EOMI, non injected sclera, pink conjunctiva, MMM, oropharynx clear, TM clear bilat no effusion  Neck- Supple, no thyromegaly, no bruit  CVS- RRR, no murmur RESP-CTAB ABD-NABS,soft,NT,ND,COLOSTOMY in tact  EXT- No edema Pulses- Radial, DP- 2+        Assessment & Plan:      Problem List Items Addressed This Visit      Unprioritized   Colon cancer screening    Referral to GI again      GERD (gastroesophageal reflux disease)    Change to pepcid liquid prn, he will see how he does off meds for a couple of weeks, will have this on hand If he does use any NSAIDS , needs to take H2 blocker      Relevant Medications   famotidine (PEPCID) 40 MG/5ML suspension   HTN (hypertension), benign    Controlled no changes       Relevant  Orders   Comprehensive metabolic panel   Lipid panel   RESOLVED: Protein-calorie malnutrition ([place])   S/P colostomy ([place])    Other Visit Diagnoses    Routine general medical examination at a health care facility    -  Primary   CPE done, no changes in meds    Relevant Orders   CBC with Differential/Platelet   Comprehensive metabolic panel   Lipid panel   Screening PSA (prostate specific antigen)       Relevant Orders   PSA      Note: This dictation was prepared with Dragon dictation along with smaller phrase technology. Any transcriptional errors that result from this process are unintentional.

## 2019-05-29 NOTE — Assessment & Plan Note (Signed)
Controlled no changes 

## 2019-05-29 NOTE — Assessment & Plan Note (Signed)
Change to pepcid liquid prn, he will see how he does off meds for a couple of weeks, will have this on hand If he does use any NSAIDS , needs to take H2 blocker

## 2019-05-29 NOTE — Assessment & Plan Note (Signed)
Referral to GI again

## 2019-05-30 LAB — CBC WITH DIFFERENTIAL/PLATELET
Absolute Monocytes: 529 cells/uL (ref 200–950)
Basophils Absolute: 82 cells/uL (ref 0–200)
Basophils Relative: 1.9 %
Eosinophils Absolute: 400 cells/uL (ref 15–500)
Eosinophils Relative: 9.3 %
HCT: 37 % — ABNORMAL LOW (ref 38.5–50.0)
Hemoglobin: 12.4 g/dL — ABNORMAL LOW (ref 13.2–17.1)
Lymphs Abs: 1230 cells/uL (ref 850–3900)
MCH: 31.8 pg (ref 27.0–33.0)
MCHC: 33.5 g/dL (ref 32.0–36.0)
MCV: 94.9 fL (ref 80.0–100.0)
MPV: 10.3 fL (ref 7.5–12.5)
Monocytes Relative: 12.3 %
Neutro Abs: 2060 cells/uL (ref 1500–7800)
Neutrophils Relative %: 47.9 %
Platelets: 487 10*3/uL — ABNORMAL HIGH (ref 140–400)
RBC: 3.9 10*6/uL — ABNORMAL LOW (ref 4.20–5.80)
RDW: 12.5 % (ref 11.0–15.0)
Total Lymphocyte: 28.6 %
WBC: 4.3 10*3/uL (ref 3.8–10.8)

## 2019-05-30 LAB — COMPREHENSIVE METABOLIC PANEL
AG Ratio: 1.4 (calc) (ref 1.0–2.5)
ALT: 41 U/L (ref 9–46)
AST: 57 U/L — ABNORMAL HIGH (ref 10–35)
Albumin: 4.1 g/dL (ref 3.6–5.1)
Alkaline phosphatase (APISO): 122 U/L (ref 35–144)
BUN/Creatinine Ratio: 16 (calc) (ref 6–22)
BUN: 26 mg/dL — ABNORMAL HIGH (ref 7–25)
CO2: 23 mmol/L (ref 20–32)
Calcium: 9.6 mg/dL (ref 8.6–10.3)
Chloride: 102 mmol/L (ref 98–110)
Creat: 1.61 mg/dL — ABNORMAL HIGH (ref 0.70–1.25)
Globulin: 3 g/dL (calc) (ref 1.9–3.7)
Glucose, Bld: 102 mg/dL — ABNORMAL HIGH (ref 65–99)
Potassium: 4.3 mmol/L (ref 3.5–5.3)
Sodium: 137 mmol/L (ref 135–146)
Total Bilirubin: 1.6 mg/dL — ABNORMAL HIGH (ref 0.2–1.2)
Total Protein: 7.1 g/dL (ref 6.1–8.1)

## 2019-05-30 LAB — LIPID PANEL
Cholesterol: 182 mg/dL (ref <200)
HDL: 100 mg/dL (ref >=40)
LDL Cholesterol (Calc): 67 mg/dL (calc)
Non-HDL Cholesterol (Calc): 82 mg/dL (calc) (ref <130)
Total CHOL/HDL Ratio: 1.8 (calc) (ref <5.0)
Triglycerides: 69 mg/dL (ref <150)

## 2019-05-30 LAB — PSA: PSA: 0.5 ng/mL (ref <=4.0)

## 2019-06-01 ENCOUNTER — Telehealth: Payer: Self-pay | Admitting: Gastroenterology

## 2019-06-01 NOTE — Telephone Encounter (Signed)
DOD 05-29-19 AM Dr. Havery Moros  We have received a referral for the patient to have a colonoscopy/ S/P colostomy. Patient last colonoscopy was in 04-2013. Records will be sent to DOD for review. Please advise for scheduling.

## 2019-06-05 ENCOUNTER — Other Ambulatory Visit: Payer: Self-pay | Admitting: *Deleted

## 2019-06-05 DIAGNOSIS — R7989 Other specified abnormal findings of blood chemistry: Secondary | ICD-10-CM

## 2019-06-05 DIAGNOSIS — N179 Acute kidney failure, unspecified: Secondary | ICD-10-CM

## 2019-06-19 ENCOUNTER — Other Ambulatory Visit: Payer: Self-pay | Admitting: Family Medicine

## 2019-06-20 ENCOUNTER — Other Ambulatory Visit: Payer: Self-pay | Admitting: *Deleted

## 2019-06-20 LAB — COMPLETE METABOLIC PANEL WITH GFR
AG Ratio: 1.3 (calc) (ref 1.0–2.5)
ALT: 22 U/L (ref 9–46)
AST: 35 U/L (ref 10–35)
Albumin: 4.2 g/dL (ref 3.6–5.1)
Alkaline phosphatase (APISO): 81 U/L (ref 35–144)
BUN/Creatinine Ratio: 12 (calc) (ref 6–22)
BUN: 23 mg/dL (ref 7–25)
CO2: 33 mmol/L — ABNORMAL HIGH (ref 20–32)
Calcium: 9.5 mg/dL (ref 8.6–10.3)
Chloride: 97 mmol/L — ABNORMAL LOW (ref 98–110)
Creat: 1.9 mg/dL — ABNORMAL HIGH (ref 0.70–1.25)
GFR, Est African American: 43 mL/min/{1.73_m2} — ABNORMAL LOW (ref >=60)
GFR, Est Non African American: 37 mL/min/{1.73_m2} — ABNORMAL LOW (ref >=60)
Globulin: 3.2 g/dL (calc) (ref 1.9–3.7)
Glucose, Bld: 105 mg/dL (ref 65–139)
Potassium: 3.6 mmol/L (ref 3.5–5.3)
Sodium: 138 mmol/L (ref 135–146)
Total Bilirubin: 1 mg/dL (ref 0.2–1.2)
Total Protein: 7.4 g/dL (ref 6.1–8.1)

## 2019-06-20 MED ORDER — FENTANYL 25 MCG/HR TD PT72: 1.0000 | MEDICATED_PATCH | TRANSDERMAL | 0 refills | Status: DC

## 2019-06-20 NOTE — Telephone Encounter (Signed)
Received call from patient.   Requested refill on Duragesic.   Ok to refill??  Last office visit 05/29/2019.  Last refill 05/25/2019.

## 2019-06-21 ENCOUNTER — Other Ambulatory Visit: Payer: Self-pay | Admitting: *Deleted

## 2019-06-21 DIAGNOSIS — N179 Acute kidney failure, unspecified: Secondary | ICD-10-CM

## 2019-06-21 MED ORDER — METOPROLOL SUCCINATE ER 25 MG PO TB24: 25.0000 mg | ORAL_TABLET | Freq: Every day | ORAL | 3 refills | Status: DC

## 2019-07-20 ENCOUNTER — Other Ambulatory Visit: Payer: Self-pay | Admitting: *Deleted

## 2019-07-20 MED ORDER — FENTANYL 25 MCG/HR TD PT72: 1.0000 | MEDICATED_PATCH | TRANSDERMAL | 0 refills | Status: DC

## 2019-07-20 NOTE — Telephone Encounter (Signed)
Received call from patient.   Requested refill on Duragesic.   Ok to refill??  Last office visit 05/29/2019.  Last refill 06/20/2019.

## 2019-08-22 ENCOUNTER — Other Ambulatory Visit: Payer: Self-pay

## 2019-08-22 MED ORDER — FENTANYL 25 MCG/HR TD PT72: 1.0000 | MEDICATED_PATCH | TRANSDERMAL | 0 refills | Status: DC

## 2019-08-22 NOTE — Telephone Encounter (Signed)
Requested Prescriptions   Pending Prescriptions Disp Refills  . fentaNYL (DURAGESIC) 25 MCG/HR 10 patch 0    Sig: Place 1 patch onto the skin every 3 (three) days.     Last OV 05/29/2019  Last written 07/20/2019

## 2019-09-21 ENCOUNTER — Other Ambulatory Visit: Payer: Self-pay | Admitting: *Deleted

## 2019-09-21 MED ORDER — FENTANYL 25 MCG/HR TD PT72: 1.0000 | MEDICATED_PATCH | TRANSDERMAL | 0 refills | Status: DC

## 2019-09-21 NOTE — Telephone Encounter (Signed)
Received call from patient.  Requested refill on Fentanyl.   Ok to refill??  Last office visit 05/29/2019.  Last refill 08/22/2019.

## 2019-10-23 ENCOUNTER — Other Ambulatory Visit: Payer: Self-pay | Admitting: Family Medicine

## 2019-10-23 MED ORDER — FENTANYL 25 MCG/HR TD PT72: 1.0000 | MEDICATED_PATCH | TRANSDERMAL | 0 refills | Status: DC

## 2019-10-23 NOTE — Telephone Encounter (Signed)
Patient  Needs refill on his pain patches [employer]

## 2019-10-23 NOTE — Telephone Encounter (Signed)
Pain patches last filled on 09/21/2019. Last office visit: 05/29/2019

## 2019-11-20 ENCOUNTER — Other Ambulatory Visit: Payer: Self-pay | Admitting: *Deleted

## 2019-11-20 MED ORDER — FENTANYL 25 MCG/HR TD PT72: 1.0000 | MEDICATED_PATCH | TRANSDERMAL | 0 refills | Status: DC

## 2019-11-20 NOTE — Telephone Encounter (Signed)
Received call from patient.   Requested refill on Fentanyl.   Ok to refill??  Last office visit 05/29/2019.  Last refill 10/23/2019.

## 2019-11-30 ENCOUNTER — Encounter: Payer: Self-pay | Admitting: Family Medicine

## 2019-11-30 ENCOUNTER — Ambulatory Visit: Payer: 59 | Admitting: Family Medicine

## 2019-11-30 ENCOUNTER — Other Ambulatory Visit: Payer: Self-pay

## 2019-11-30 VITALS — BP 138/88 | HR 64 | Temp 98.2°F | Resp 14 | Ht 69.0 in | Wt 156.0 lb

## 2019-11-30 DIAGNOSIS — Z2239 Carrier of other specified bacterial diseases: Secondary | ICD-10-CM

## 2019-11-30 DIAGNOSIS — I10 Essential (primary) hypertension: Secondary | ICD-10-CM | POA: Diagnosis not present

## 2019-11-30 DIAGNOSIS — N1832 Chronic kidney disease, stage 3b: Secondary | ICD-10-CM

## 2019-11-30 DIAGNOSIS — R109 Unspecified abdominal pain: Secondary | ICD-10-CM | POA: Diagnosis not present

## 2019-11-30 DIAGNOSIS — Z933 Colostomy status: Secondary | ICD-10-CM

## 2019-11-30 DIAGNOSIS — Z23 Encounter for immunization: Secondary | ICD-10-CM | POA: Diagnosis not present

## 2019-11-30 DIAGNOSIS — K219 Gastro-esophageal reflux disease without esophagitis: Secondary | ICD-10-CM | POA: Diagnosis not present

## 2019-11-30 DIAGNOSIS — G8929 Other chronic pain: Secondary | ICD-10-CM

## 2019-11-30 DIAGNOSIS — N179 Acute kidney failure, unspecified: Secondary | ICD-10-CM

## 2019-11-30 LAB — URINALYSIS, ROUTINE W REFLEX MICROSCOPIC
Bilirubin Urine: NEGATIVE
Glucose, UA: NEGATIVE
Hyaline Cast: NONE SEEN /LPF
Ketones, ur: NEGATIVE
Nitrite: NEGATIVE
Specific Gravity, Urine: 1.02 (ref 1.001–1.03)
pH: 6.5 (ref 5.0–8.0)

## 2019-11-30 LAB — MICROSCOPIC MESSAGE

## 2019-11-30 MED ORDER — FAMOTIDINE 40 MG/5ML PO SUSR: 20.0000 mg | Freq: Two times a day (BID) | ORAL | 2 refills | Status: DC

## 2019-11-30 NOTE — Assessment & Plan Note (Signed)
Chronic abdominal pain status post colostomy long-term continue with fentanyl patch Given information regarding the gastroenterologist for his colon cancer screening

## 2019-11-30 NOTE — Assessment & Plan Note (Signed)
He continues to benefit from H2 blocker no changes.

## 2019-11-30 NOTE — Patient Instructions (Signed)
Dr. Havery Moros ( Stomach doctor)  [phone]  Call about your colonoscopy appointment  We will call lab results F/U 6 months for Physical

## 2019-11-30 NOTE — Progress Notes (Signed)
Subjective:    Patient ID: Johnathan Baldwin, male    DOB: September 16, 1956, 63 y.o.   MRN: [id number]  Patient presents for Follow-up (is fasting)  Patient here to follow-up chronic medical problems.  Medications reviewed.  Chronic abdominal pain he is on long-term fentanyl patches not had any difficulty with the medication.  Continues to help control his pain.  Hypertension-  he has been maintained on metoprolol 25 mg once a day no side effects with the medication, He took his meds 30 minutes before the visit    Acid reflux he is on Pepcid 20 mg twice a day liquid formulary in the setting of his ostomy  Was referred to GI at his physical in June to discuss colon cancer screening the setting of his ostomy, he states with Covid he is not sure what happened but needs the information to the office to set this up.  On his labs back in June he had acute renal failure creatinine was up to 1.9 at that time I discontinue the HCTZ and switch him to the metoprolol.  He did not come back in for recheck.  Denies any difficulty urinating does admit he has not been drinking as much water   Review Of Systems:  GEN- denies fatigue, fever, weight loss,weakness, recent illness HEENT- denies eye drainage, change in vision, nasal discharge, CVS- denies chest pain, palpitations RESP- denies SOB, cough, wheeze ABD- denies N/V, change in stools, abd pain GU- denies dysuria, hematuria, dribbling, incontinence MSK- denies joint pain, muscle aches, injury Neuro- denies headache, dizziness, syncope, seizure activity       Objective:    BP 138/88   Pulse 64   Temp 98.2 F (36.8 C) (Temporal)   Resp 14   Ht 5\' 9"  (1.753 m)   Wt 156 lb (70.8 kg)   SpO2 99%   BMI 23.04 kg/m  GEN- NAD, alert and oriented x3 HEENT- PERRL, EOMI, non injected sclera, pink conjunctiva,  Neck- Supple, no thyromegaly CVS- RRR, no murmur RESP-CTAB ABD-NABS,soft,NT,ND, ostomy bag intact EXT- No edema Pulses- Radial,  2+        Assessment & Plan:      Problem List Items Addressed This Visit      Unprioritized   Chronic abdominal pain    Chronic abdominal pain status post colostomy long-term continue with fentanyl patch Given information regarding the gastroenterologist for his colon cancer screening      GERD (gastroesophageal reflux disease)    He continues to benefit from H2 blocker no changes.      HTN (hypertension), benign - Primary    Blood pressure shows good control continue the metoprolol.  We will recheck his renal function that was the reason for switching him off of HCTZ.  We will also leave urinalysis to look for protein.  Next step if his creatinine has not improved so obtain renal ultrasound and I discussed this with the patient.      Relevant Orders   Comprehensive metabolic panel   CBC with Differential   S/P colostomy (HCC)    Other Visit Diagnoses    Acute renal failure, unspecified acute renal failure type (HCC)       Relevant Orders   Comprehensive metabolic panel   CBC with Differential   Microalbumin/Creatinine Ratio, Urine   Urinalysis, Routine w reflex microscopic (Completed)   Urine Culture   Need for immunization against influenza       Relevant Orders   Flu Vaccine QUAD 36+ mos  IM (Completed)      Note: This dictation was prepared with Dragon dictation along with smaller phrase technology. Any transcriptional errors that result from this process are unintentional.

## 2019-11-30 NOTE — Assessment & Plan Note (Signed)
Blood pressure shows good control continue the metoprolol.  We will recheck his renal function that was the reason for switching him off of HCTZ.  We will also leave urinalysis to look for protein.  Next step if his creatinine has not improved so obtain renal ultrasound and I discussed this with the patient.

## 2019-12-01 LAB — CBC WITH DIFFERENTIAL/PLATELET
Absolute Monocytes: 480 cells/uL (ref 200–950)
Basophils Absolute: 48 cells/uL (ref 0–200)
Basophils Relative: 1.1 %
Eosinophils Absolute: 717 cells/uL — ABNORMAL HIGH (ref 15–500)
Eosinophils Relative: 16.3 %
HCT: 38.7 % (ref 38.5–50.0)
Hemoglobin: 13 g/dL — ABNORMAL LOW (ref 13.2–17.1)
Lymphs Abs: 1298 cells/uL (ref 850–3900)
MCH: 31.9 pg (ref 27.0–33.0)
MCHC: 33.6 g/dL (ref 32.0–36.0)
MCV: 95.1 fL (ref 80.0–100.0)
MPV: 12.1 fL (ref 7.5–12.5)
Monocytes Relative: 10.9 %
Neutro Abs: 1857 cells/uL (ref 1500–7800)
Neutrophils Relative %: 42.2 %
Platelets: 232 10*3/uL (ref 140–400)
RBC: 4.07 10*6/uL — ABNORMAL LOW (ref 4.20–5.80)
RDW: 12 % (ref 11.0–15.0)
Total Lymphocyte: 29.5 %
WBC: 4.4 10*3/uL (ref 3.8–10.8)

## 2019-12-01 LAB — COMPREHENSIVE METABOLIC PANEL
AG Ratio: 1.4 (calc) (ref 1.0–2.5)
ALT: 15 U/L (ref 9–46)
AST: 26 U/L (ref 10–35)
Albumin: 4.2 g/dL (ref 3.6–5.1)
Alkaline phosphatase (APISO): 70 U/L (ref 35–144)
BUN/Creatinine Ratio: 11 (calc) (ref 6–22)
BUN: 18 mg/dL (ref 7–25)
CO2: 25 mmol/L (ref 20–32)
Calcium: 9.4 mg/dL (ref 8.6–10.3)
Chloride: 105 mmol/L (ref 98–110)
Creat: 1.7 mg/dL — ABNORMAL HIGH (ref 0.70–1.25)
Globulin: 3 g/dL (calc) (ref 1.9–3.7)
Glucose, Bld: 110 mg/dL — ABNORMAL HIGH (ref 65–99)
Potassium: 4.4 mmol/L (ref 3.5–5.3)
Sodium: 141 mmol/L (ref 135–146)
Total Bilirubin: 1 mg/dL (ref 0.2–1.2)
Total Protein: 7.2 g/dL (ref 6.1–8.1)

## 2019-12-01 LAB — MICROALBUMIN / CREATININE URINE RATIO
Creatinine, Urine: 149 mg/dL (ref 20–320)
Microalb Creat Ratio: 55 mcg/mg creat — ABNORMAL HIGH (ref <30)
Microalb, Ur: 8.2 mg/dL

## 2019-12-02 LAB — URINE CULTURE
MICRO NUMBER:: 1189288
SPECIMEN QUALITY:: ADEQUATE

## 2019-12-03 ENCOUNTER — Other Ambulatory Visit: Payer: Self-pay

## 2019-12-03 ENCOUNTER — Ambulatory Visit (HOSPITAL_COMMUNITY)
Admission: RE | Admit: 2019-12-03 | Discharge: 2019-12-03 | Disposition: A | Discharge status: Home / Self Care | Payer: 59 | Source: Ambulatory Visit | Attending: Family Medicine | Admitting: Family Medicine

## 2019-12-03 DIAGNOSIS — N179 Acute kidney failure, unspecified: Secondary | ICD-10-CM | POA: Insufficient documentation

## 2019-12-03 NOTE — Addendum Note (Signed)
Addended by: Vic Blackbird F on: 12/03/2019 08:30 AM   Modules accepted: Orders

## 2019-12-03 NOTE — Addendum Note (Signed)
Addended by: Vic Blackbird F on: 12/03/2019 01:53 PM   Modules accepted: Orders

## 2019-12-04 ENCOUNTER — Other Ambulatory Visit: Payer: 59

## 2019-12-04 DIAGNOSIS — Z2239 Carrier of other specified bacterial diseases: Secondary | ICD-10-CM

## 2019-12-04 LAB — MICROSCOPIC MESSAGE

## 2019-12-04 LAB — URINALYSIS, ROUTINE W REFLEX MICROSCOPIC
Bacteria, UA: NONE SEEN /HPF
Bilirubin Urine: NEGATIVE
Glucose, UA: NEGATIVE
Hyaline Cast: NONE SEEN /LPF
Ketones, ur: NEGATIVE
Nitrite: NEGATIVE
Protein, ur: NEGATIVE
Specific Gravity, Urine: 1.015 (ref 1.001–1.03)
pH: 7 (ref 5.0–8.0)

## 2019-12-06 LAB — URINE CULTURE
MICRO NUMBER:: 1199199
SPECIMEN QUALITY:: ADEQUATE

## 2019-12-20 ENCOUNTER — Other Ambulatory Visit: Payer: Self-pay | Admitting: *Deleted

## 2019-12-20 MED ORDER — FENTANYL 25 MCG/HR TD PT72: 1.0000 | MEDICATED_PATCH | TRANSDERMAL | 0 refills | Status: DC

## 2019-12-20 NOTE — Telephone Encounter (Signed)
Received call from patient.   Requested refill on Fentanyl Patch.  Ok to refill??  Last office visit 11/30/2019.   Last refill 11/20/2019.

## 2019-12-27 ENCOUNTER — Telehealth: Payer: Self-pay | Admitting: Family Medicine

## 2019-12-28 ENCOUNTER — Ambulatory Visit: Payer: 59 | Admitting: Family Medicine

## 2019-12-28 ENCOUNTER — Other Ambulatory Visit: Payer: Self-pay

## 2019-12-28 ENCOUNTER — Encounter: Payer: Self-pay | Admitting: Family Medicine

## 2019-12-28 VITALS — BP 136/88 | HR 88 | Temp 98.6°F | Resp 16 | Ht 69.0 in | Wt 156.0 lb

## 2019-12-28 DIAGNOSIS — K2900 Acute gastritis without bleeding: Secondary | ICD-10-CM

## 2019-12-28 DIAGNOSIS — K219 Gastro-esophageal reflux disease without esophagitis: Secondary | ICD-10-CM

## 2019-12-28 NOTE — Patient Instructions (Signed)
Restart PEPCID twice a day for the next 2 weeks, then go down to once a day  Stop taking ibuprofen/aspirin/aleve You can take tylenol We will call with results  Cut out spicey foods/hot sauce F/U as needed

## 2019-12-28 NOTE — Progress Notes (Signed)
   Subjective:    Patient ID: Johnathan Baldwin, male    DOB: January 14, 1956, 64 y.o.   MRN: [id number]  Patient presents for R Sided Epigasatric Pain (x4 days- pain comes after eating, esp spicy foods)   Right upper quadrant pain intermittently for the pas few days. Pain occurs after eating about 10-15 minutes. He has been eating spicy foods, put hot sauce on everything. Denies any burning sensation in chest or throat. He had 1 episode where he had pain in right shoulder and the chest last a few minutes. No change in his colostomy output.  He did take ASA/Advil after a few of the episodes Last episode was last night after eating pork chops with hot sauce   He has not been doing pepcid regulary, usually takes only 2-3 times a week once a day   NO excessive belching  He still has gallbladder  He does not have any current pain. Review Of Systems:  GEN- denies fatigue, fever, weight loss,weakness, recent illness HEENT- denies eye drainage, change in vision, nasal discharge, CVS- denies chest pain, palpitations RESP- denies SOB, cough, wheeze ABD- denies N/V, change in stools, abd pain GU- denies dysuria, hematuria, dribbling, incontinence MSK- denies joint pain, muscle aches, injury Neuro- denies headache, dizziness, syncope, seizure activity       Objective:    BP 136/88   Pulse 88   Temp 98.6 F (37 C) (Temporal)   Resp 16   Ht 5\' 9"  (1.753 m)   Wt 156 lb (70.8 kg)   SpO2 98%   BMI 23.04 kg/m  GEN- NAD, alert and oriented x3, well-appearing HEENT- PERRL, EOMI, non injected sclera, pink conjunctiva CVS- RRR, no murmur RESP-CTAB ABD-NABS,soft,NT,ND, colostomy intact. EXT- No edema Pulses- Radial, 2+        Assessment & Plan:      Problem List Items Addressed This Visit      Unprioritized   GERD (gastroesophageal reflux disease)    Start his abdominal pain though intermittent is secondary to gastritis and his reflux.  It is possible that he has an ulcer as well.  We  will have him go back on his PPI twice a day.  Since he is having intermittent not constant symptoms and no weight loss will hold on GI and see if he improves by going back on his PPI.  Also recommend he decrease his spicy foods and the hot sauce.  Avoid NSAIDs.  I am going to check a lipase and metabolic panel      Relevant Orders   Lipase   Comprehensive metabolic panel    Other Visit Diagnoses    Acute gastritis without hemorrhage, unspecified gastritis type    -  Primary   Relevant Orders   Lipase   Comprehensive metabolic panel      Note: This dictation was prepared with Dragon dictation along with smaller phrase technology. Any transcriptional errors that result from this process are unintentional.

## 2019-12-28 NOTE — Assessment & Plan Note (Signed)
Start his abdominal pain though intermittent is secondary to gastritis and his reflux.  It is possible that he has an ulcer as well.  We will have him go back on his PPI twice a day.  Since he is having intermittent not constant symptoms and no weight loss will hold on GI and see if he improves by going back on his PPI.  Also recommend he decrease his spicy foods and the hot sauce.  Avoid NSAIDs.  I am going to check a lipase and metabolic panel

## 2019-12-29 LAB — COMPREHENSIVE METABOLIC PANEL
AG Ratio: 1.3 (calc) (ref 1.0–2.5)
ALT: 17 U/L (ref 9–46)
AST: 27 U/L (ref 10–35)
Albumin: 4.3 g/dL (ref 3.6–5.1)
Alkaline phosphatase (APISO): 69 U/L (ref 35–144)
BUN/Creatinine Ratio: 9 (calc) (ref 6–22)
BUN: 17 mg/dL (ref 7–25)
CO2: 27 mmol/L (ref 20–32)
Calcium: 9.4 mg/dL (ref 8.6–10.3)
Chloride: 103 mmol/L (ref 98–110)
Creat: 1.92 mg/dL — ABNORMAL HIGH (ref 0.70–1.25)
Globulin: 3.3 g/dL (calc) (ref 1.9–3.7)
Glucose, Bld: 123 mg/dL — ABNORMAL HIGH (ref 65–99)
Potassium: 4.6 mmol/L (ref 3.5–5.3)
Sodium: 138 mmol/L (ref 135–146)
Total Bilirubin: 0.9 mg/dL (ref 0.2–1.2)
Total Protein: 7.6 g/dL (ref 6.1–8.1)

## 2019-12-29 LAB — LIPASE: Lipase: 21 U/L (ref 7–60)

## 2020-01-18 ENCOUNTER — Other Ambulatory Visit: Payer: Self-pay | Admitting: Family Medicine

## 2020-01-21 ENCOUNTER — Other Ambulatory Visit: Payer: Self-pay | Admitting: *Deleted

## 2020-01-21 MED ORDER — FENTANYL 25 MCG/HR TD PT72: 1.0000 | MEDICATED_PATCH | TRANSDERMAL | 0 refills | Status: DC

## 2020-01-21 NOTE — Telephone Encounter (Signed)
Received call from patient.   Requested refill on Fentanyl.   Ok to refill??  Last office visit 12/28/2019.  Last refill 12/20/2019.

## 2020-01-30 ENCOUNTER — Other Ambulatory Visit: Payer: Self-pay | Admitting: Family Medicine

## 2020-02-05 ENCOUNTER — Other Ambulatory Visit (HOSPITAL_COMMUNITY): Payer: Self-pay | Admitting: Nephrology

## 2020-02-05 DIAGNOSIS — N1832 Chronic kidney disease, stage 3b: Secondary | ICD-10-CM

## 2020-02-12 ENCOUNTER — Ambulatory Visit (HOSPITAL_COMMUNITY): Admission: RE | Admit: 2020-02-12 | Payer: 59 | Source: Ambulatory Visit

## 2020-02-13 ENCOUNTER — Other Ambulatory Visit: Payer: Self-pay | Admitting: Family Medicine

## 2020-02-15 ENCOUNTER — Ambulatory Visit (HOSPITAL_COMMUNITY)
Admission: RE | Admit: 2020-02-15 | Discharge: 2020-02-15 | Disposition: A | Discharge status: Home / Self Care | Payer: 59 | Source: Ambulatory Visit | Attending: Nephrology | Admitting: Nephrology

## 2020-02-15 ENCOUNTER — Other Ambulatory Visit: Payer: Self-pay

## 2020-02-15 DIAGNOSIS — N1832 Chronic kidney disease, stage 3b: Secondary | ICD-10-CM | POA: Diagnosis present

## 2020-02-20 ENCOUNTER — Other Ambulatory Visit: Payer: Self-pay | Admitting: *Deleted

## 2020-02-20 MED ORDER — FENTANYL 25 MCG/HR TD PT72: 1.0000 | MEDICATED_PATCH | TRANSDERMAL | 0 refills | Status: DC

## 2020-02-20 NOTE — Telephone Encounter (Signed)
Received call from patient.   Requested refill on Fentanyl.   Ok to refill??  Last office visit 12/28/2019.  Last refill 01/21/2020.

## 2020-03-03 ENCOUNTER — Other Ambulatory Visit: Payer: Self-pay | Admitting: Family Medicine

## 2020-03-04 ENCOUNTER — Ambulatory Visit: Payer: 59 | Attending: Internal Medicine

## 2020-03-04 DIAGNOSIS — Z23 Encounter for immunization: Secondary | ICD-10-CM

## 2020-03-04 NOTE — Progress Notes (Signed)
   Covid-19 Vaccination Clinic  Name:  Johnathan Baldwin    MRN: [id number] DOB: 04-15-1956  03/04/2020  [name] was observed post Covid-19 immunization for 15 minutes without incident. He was provided with Vaccine Information Sheet and instruction to access the V-Safe system.   [name] was instructed to call 911 with any severe reactions post vaccine: Marland Kitchen Difficulty breathing  . Swelling of face and throat  . A fast heartbeat  . A bad rash all over body  . Dizziness and weakness   Immunizations Administered    Name Date Dose VIS Date Route   Moderna COVID-19 Vaccine 03/04/2020  3:21 PM 0.5 mL 11/20/2019 Intramuscular   Manufacturer: Moderna   Lot: [id number]   NDC: 44392-659-97

## 2020-03-20 ENCOUNTER — Other Ambulatory Visit: Payer: Self-pay

## 2020-03-20 MED ORDER — FENTANYL 25 MCG/HR TD PT72: 1.0000 | MEDICATED_PATCH | TRANSDERMAL | 0 refills | Status: DC

## 2020-03-20 NOTE — Telephone Encounter (Signed)
Requested Prescriptions   Pending Prescriptions Disp Refills  . fentaNYL (DURAGESIC) 25 MCG/HR 10 patch 0    Sig: Place 1 patch onto the skin every 3 (three) days.     Last OV 12/28/2019   Last written 02/20/2020

## 2020-03-24 ENCOUNTER — Other Ambulatory Visit: Payer: Self-pay | Admitting: Family Medicine

## 2020-04-15 ENCOUNTER — Ambulatory Visit: Payer: 59 | Attending: Internal Medicine

## 2020-04-15 DIAGNOSIS — Z23 Encounter for immunization: Secondary | ICD-10-CM

## 2020-04-15 NOTE — Progress Notes (Signed)
   Covid-19 Vaccination Clinic  Name:  Johnathan Baldwin    MRN: [id number] DOB: 1956-10-10  04/15/2020  Mr. Johnathan Baldwin was observed post Covid-19 immunization for 15 minutes without incident. He was provided with Vaccine Information Sheet and instruction to access the V-Safe system.   Mr. Johnathan Baldwin was instructed to call 911 with any severe reactions post vaccine: Marland Kitchen Difficulty breathing  . Swelling of face and throat  . A fast heartbeat  . A bad rash all over body  . Dizziness and weakness   Immunizations Administered    Name Date Dose VIS Date Route   Moderna COVID-19 Vaccine 04/15/2020  9:01 AM 0.5 mL 11/2019 Intramuscular   Manufacturer: Moderna   Lot: [id number]   NDC: 12244-975-30

## 2020-04-18 ENCOUNTER — Other Ambulatory Visit: Payer: Self-pay | Admitting: Family Medicine

## 2020-04-18 ENCOUNTER — Other Ambulatory Visit: Payer: Self-pay | Admitting: *Deleted

## 2020-04-18 MED ORDER — FENTANYL 25 MCG/HR TD PT72: 1.0000 | MEDICATED_PATCH | TRANSDERMAL | 0 refills | Status: DC

## 2020-04-18 NOTE — Telephone Encounter (Signed)
Received call from patient.   Requested refill on Duragesic.   Ok to refill??  Last office visit 12/28/2019.  Last refill 03/20/2020.

## 2020-05-16 ENCOUNTER — Inpatient Hospital Stay (HOSPITAL_COMMUNITY)
Admission: EM | Admit: 2020-05-16 | Discharge: 2020-05-19 | DRG: 659 | Disposition: A | Discharge status: Home / Self Care | Payer: 59 | Attending: Family Medicine | Admitting: Family Medicine

## 2020-05-16 ENCOUNTER — Telehealth: Payer: Self-pay | Admitting: Family Medicine

## 2020-05-16 ENCOUNTER — Other Ambulatory Visit: Payer: Self-pay

## 2020-05-16 ENCOUNTER — Encounter (HOSPITAL_COMMUNITY): Payer: Self-pay | Admitting: *Deleted

## 2020-05-16 ENCOUNTER — Emergency Department (HOSPITAL_COMMUNITY): Payer: 59

## 2020-05-16 DIAGNOSIS — G8929 Other chronic pain: Secondary | ICD-10-CM | POA: Diagnosis present

## 2020-05-16 DIAGNOSIS — N1832 Chronic kidney disease, stage 3b: Secondary | ICD-10-CM | POA: Diagnosis present

## 2020-05-16 DIAGNOSIS — Z79899 Other long term (current) drug therapy: Secondary | ICD-10-CM | POA: Diagnosis not present

## 2020-05-16 DIAGNOSIS — Z8249 Family history of ischemic heart disease and other diseases of the circulatory system: Secondary | ICD-10-CM | POA: Diagnosis not present

## 2020-05-16 DIAGNOSIS — N179 Acute kidney failure, unspecified: Secondary | ICD-10-CM | POA: Diagnosis not present

## 2020-05-16 DIAGNOSIS — R109 Unspecified abdominal pain: Secondary | ICD-10-CM | POA: Diagnosis present

## 2020-05-16 DIAGNOSIS — Z888 Allergy status to other drugs, medicaments and biological substances status: Secondary | ICD-10-CM | POA: Diagnosis not present

## 2020-05-16 DIAGNOSIS — E869 Volume depletion, unspecified: Secondary | ICD-10-CM | POA: Diagnosis present

## 2020-05-16 DIAGNOSIS — N400 Enlarged prostate without lower urinary tract symptoms: Secondary | ICD-10-CM | POA: Diagnosis present

## 2020-05-16 DIAGNOSIS — I129 Hypertensive chronic kidney disease with stage 1 through stage 4 chronic kidney disease, or unspecified chronic kidney disease: Secondary | ICD-10-CM | POA: Diagnosis present

## 2020-05-16 DIAGNOSIS — Z20822 Contact with and (suspected) exposure to covid-19: Secondary | ICD-10-CM | POA: Diagnosis present

## 2020-05-16 DIAGNOSIS — N132 Hydronephrosis with renal and ureteral calculous obstruction: Secondary | ICD-10-CM | POA: Diagnosis not present

## 2020-05-16 DIAGNOSIS — I10 Essential (primary) hypertension: Secondary | ICD-10-CM | POA: Diagnosis not present

## 2020-05-16 DIAGNOSIS — N170 Acute kidney failure with tubular necrosis: Secondary | ICD-10-CM | POA: Diagnosis present

## 2020-05-16 DIAGNOSIS — K219 Gastro-esophageal reflux disease without esophagitis: Secondary | ICD-10-CM | POA: Diagnosis not present

## 2020-05-16 DIAGNOSIS — N1330 Unspecified hydronephrosis: Secondary | ICD-10-CM

## 2020-05-16 DIAGNOSIS — K319 Disease of stomach and duodenum, unspecified: Secondary | ICD-10-CM | POA: Diagnosis present

## 2020-05-16 DIAGNOSIS — E559 Vitamin D deficiency, unspecified: Secondary | ICD-10-CM | POA: Diagnosis present

## 2020-05-16 DIAGNOSIS — N201 Calculus of ureter: Principal | ICD-10-CM | POA: Diagnosis present

## 2020-05-16 DIAGNOSIS — Z87442 Personal history of urinary calculi: Secondary | ICD-10-CM

## 2020-05-16 DIAGNOSIS — D631 Anemia in chronic kidney disease: Secondary | ICD-10-CM | POA: Diagnosis present

## 2020-05-16 DIAGNOSIS — N135 Crossing vessel and stricture of ureter without hydronephrosis: Secondary | ICD-10-CM | POA: Diagnosis not present

## 2020-05-16 DIAGNOSIS — R7989 Other specified abnormal findings of blood chemistry: Secondary | ICD-10-CM | POA: Diagnosis present

## 2020-05-16 LAB — CBC
HCT: 43.4 % (ref 39.0–52.0)
Hemoglobin: 13.8 g/dL (ref 13.0–17.0)
MCH: 31.7 pg (ref 26.0–34.0)
MCHC: 31.8 g/dL (ref 30.0–36.0)
MCV: 99.8 fL (ref 80.0–100.0)
Platelets: 369 10*3/uL (ref 150–400)
RBC: 4.35 MIL/uL (ref 4.22–5.81)
RDW: 14.7 % (ref 11.5–15.5)
WBC: 8 10*3/uL (ref 4.0–10.5)
nRBC: 0 % (ref 0.0–0.2)

## 2020-05-16 LAB — COMPREHENSIVE METABOLIC PANEL
ALT: 17 U/L (ref 0–44)
AST: 24 U/L (ref 15–41)
Albumin: 4.4 g/dL (ref 3.5–5.0)
Alkaline Phosphatase: 74 U/L (ref 38–126)
Anion gap: 13 (ref 5–15)
BUN: 71 mg/dL — ABNORMAL HIGH (ref 8–23)
CO2: 27 mmol/L (ref 22–32)
Calcium: 9.3 mg/dL (ref 8.9–10.3)
Chloride: 97 mmol/L — ABNORMAL LOW (ref 98–111)
Creatinine, Ser: 8.99 mg/dL — ABNORMAL HIGH (ref 0.61–1.24)
GFR calc Af Amer: 7 mL/min — ABNORMAL LOW (ref >60)
GFR calc non Af Amer: 6 mL/min — ABNORMAL LOW (ref >60)
Glucose, Bld: 107 mg/dL — ABNORMAL HIGH (ref 70–99)
Potassium: 3.9 mmol/L (ref 3.5–5.1)
Sodium: 137 mmol/L (ref 135–145)
Total Bilirubin: 1 mg/dL (ref 0.3–1.2)
Total Protein: 8.3 g/dL — ABNORMAL HIGH (ref 6.5–8.1)

## 2020-05-16 LAB — LIPASE, BLOOD: Lipase: 58 U/L — ABNORMAL HIGH (ref 11–51)

## 2020-05-16 LAB — SARS CORONAVIRUS 2 BY RT PCR (HOSPITAL ORDER, PERFORMED IN ~~LOC~~ HOSPITAL LAB): SARS Coronavirus 2: NEGATIVE

## 2020-05-16 MED ORDER — DEXTROSE-NACL 5-0.45 % IV SOLN
INTRAVENOUS | Status: DC
  Administered 2020-05-16: 1000 mL via INTRAVENOUS

## 2020-05-16 MED ORDER — SODIUM CHLORIDE 0.9 % IV BOLUS
1000.0000 mL | Freq: Once | INTRAVENOUS | Status: AC
  Administered 2020-05-16: 1000 mL via INTRAVENOUS

## 2020-05-16 MED ORDER — TAMSULOSIN HCL 0.4 MG PO CAPS
0.4000 mg | ORAL_CAPSULE | Freq: Every day | ORAL | Status: DC
  Administered 2020-05-16 – 2020-05-18 (×3): 0.4 mg via ORAL
  Filled 2020-05-16 (×3): qty 1

## 2020-05-16 MED ORDER — ONDANSETRON HCL 4 MG/2ML IJ SOLN
4.0000 mg | Freq: Once | INTRAMUSCULAR | Status: AC
  Administered 2020-05-16: 4 mg via INTRAVENOUS
  Filled 2020-05-16: qty 2

## 2020-05-16 NOTE — ED Provider Notes (Signed)
[facility] EMERGENCY DEPARTMENT Provider Note   CSN: [id number] Arrival date & time: 05/16/20  1109     History Chief Complaint  Patient presents with  . Abdominal Pain    Johnathan Baldwin is a 64 y.o. male.  HPI      Johnathan Baldwin is a 64 y.o. male, with a history of chronic abdominal pain, HTN, GERD, CKD, presenting to the ED with nausea and vomiting for the past 2 days.  He has had several episodes of nonbloody nonbilious emesis. He also notes he has not been able to urinate for the past 2 days. He has some mild left-sided abdominal discomfort, vague description, nonradiating. Patient has a colostomy in place and has not noted any abnormalities to the stool. Denies fever/chills, diarrhea, chest pain, shortness of breath, orthopnea, lower extremity edema, or any other complaints.      Past Medical History:  Diagnosis Date  . Chronic abdominal pain S/P ABD. SURG FROM INJURY  . GERD (gastroesophageal reflux disease)   . Heart murmur MILD- ASYMPTOMATIC  . History of kidney stones   . Hypertension   . Left ureteral calculus   . Lung nodule 03/2012   CT scan  RIGHT MIDDLE LOBE- BENIGN    Patient Active Problem List   Diagnosis Date Noted  . S/P colostomy (HCC) 05/29/2019  . GERD (gastroesophageal reflux disease) 05/29/2019  . Chronic shoulder pain 12/08/2015  . Idiopathic proctitis 07/23/2013  . Colon cancer screening 03/23/2013  . Lung nodule 03/23/2013  . HTN (hypertension), benign 09/02/2011  . Kidney stones 09/02/2011  . Chronic abdominal pain 09/02/2011    Past Surgical History:  Procedure Laterality Date  . ABDOMINAL SURGERY AND COLOSTOMY  2000   WORK INJURY FROM MACHINE  . URETEROSCOPY  July 2013   W/ stent, s/p removal for left stone       Family History  Problem Relation Age of Onset  . Hypertension Mother   . Hypertension Brother   . Hypertension Sister   . Hypertension Brother     Social History   Tobacco Use  . Smoking status: Never  Smoker  . Smokeless tobacco: Never Used  Substance Use Topics  . Alcohol use: No  . Drug use: No    Home Medications Prior to Admission medications   Medication Sig Start Date End Date Taking? Authorizing Provider  chlorthalidone (HYGROTON) 25 MG tablet Take 25 mg by mouth in the morning. 05/05/20  Yes [provider]  famotidine (PEPCID) 40 MG/5ML suspension TAKE 2.5ML BY MOUTH TWICE DAILY. Patient taking differently: Take 20 mg by mouth 2 (two) times daily.  04/18/20  Yes [place], Velna Hatchet, MD  fentaNYL (DURAGESIC) 25 MCG/HR Place 1 patch onto the skin every 3 (three) days. 04/18/20  Yes [place], Velna Hatchet, MD  lisinopril (ZESTRIL) 10 MG tablet Take 10 mg by mouth in the morning. 05/09/20  Yes [provider]  metoprolol succinate (TOPROL-XL) 25 MG 24 hr tablet Take 1 tablet (25 mg total) by mouth daily. Patient taking differently: Take 25 mg by mouth in the morning.  06/21/19  Yes [place], Velna Hatchet, MD  potassium citrate (UROCIT-K) 10 MEQ (1080 MG) SR tablet Take 10 mEq by mouth 2 (two) times daily. 03/07/20  Yes [provider]  Vitamin D, Ergocalciferol, (DRISDOL) 1.25 MG (50000 UNIT) CAPS capsule Take 50,000 Units by mouth once a week. 04/18/20  Yes [provider]    Allergies    Norvasc [amlodipine besylate]  Review of Systems  Review of Systems  Constitutional: Negative for chills and fever.  Respiratory: Negative for shortness of breath.   Cardiovascular: Negative for chest pain.  Gastrointestinal: Positive for abdominal pain, nausea and vomiting. Negative for blood in stool.  Genitourinary: Positive for difficulty urinating. Negative for discharge, flank pain, hematuria, scrotal swelling and testicular pain.  Musculoskeletal: Negative for back pain.  All other systems reviewed and are negative.   Physical Exam Updated Vital Signs BP 129/79   Pulse (!) 52   Temp 97.8 F (36.6 C)   Resp 18   Ht  (1.753 m)   Wt 65.3 kg   SpO2 100%    BMI 21.27 kg/m   Physical Exam Vitals and nursing note reviewed.  Constitutional:      General: He is not in acute distress.    Appearance: He is well-developed. He is not diaphoretic.  HENT:     Head: Normocephalic and atraumatic.     Mouth/Throat:     Mouth: Mucous membranes are moist.     Pharynx: Oropharynx is clear.  Eyes:     Conjunctiva/sclera: Conjunctivae normal.  Cardiovascular:     Rate and Rhythm: Normal rate and regular rhythm.     Pulses: Normal pulses.          Radial pulses are 2+ on the right side and 2+ on the left side.       Posterior tibial pulses are 2+ on the right side and 2+ on the left side.     Heart sounds: Normal heart sounds.     Comments: Tactile temperature in the extremities appropriate and equal bilaterally. Pulmonary:     Effort: Pulmonary effort is normal. No respiratory distress.     Breath sounds: Normal breath sounds.  Abdominal:     Palpations: Abdomen is soft.     Tenderness: There is abdominal tenderness. There is no right CVA tenderness, left CVA tenderness or guarding.     Comments: Patient has large amount of scarring to the skin of the abdomen that he states is due to an industrial accident about 20 years ago. He has some seemingly mild tenderness to the left side of the abdomen.  Musculoskeletal:     Cervical back: Neck supple.     Right lower leg: No edema.     Left lower leg: No edema.  Lymphadenopathy:     Cervical: No cervical adenopathy.  Skin:    General: Skin is warm and dry.  Neurological:     Mental Status: He is alert.  Psychiatric:        Mood and Affect: Mood and affect normal.        Speech: Speech normal.        Behavior: Behavior normal.     ED Results / Procedures / Treatments   Labs (all labs ordered are listed, but only abnormal results are displayed) Labs Reviewed  LIPASE, BLOOD - Abnormal; Notable for the following components:      Result Value   Lipase 58 (*)    All other components within  normal limits  COMPREHENSIVE METABOLIC PANEL - Abnormal; Notable for the following components:   Chloride 97 (*)    Glucose, Bld 107 (*)    BUN 71 (*)    Creatinine, Ser 8.99 (*)    Total Protein 8.3 (*)    GFR calc non Af Amer 6 (*)    GFR calc Af Amer 7 (*)    All other components within normal limits  SARS CORONAVIRUS 2 BY RT PCR (HOSPITAL ORDER, [facility])  CBC  URINALYSIS, ROUTINE W REFLEX MICROSCOPIC   BUN  Date Value Ref Range Status  05/16/2020 71 (H) 8 - 23 mg/dL Final  12/28/2019 17 7 - 25 mg/dL Final  11/30/2019 18 7 - 25 mg/dL Final  06/19/2019 23 7 - 25 mg/dL Final   Creat  Date Value Ref Range Status  12/28/2019 1.92 (H) 0.70 - 1.25 mg/dL Final    Comment:    For patients >42 years of age, the reference limit for Creatinine is approximately 13% higher for people identified as African-American. .   11/30/2019 1.70 (H) 0.70 - 1.25 mg/dL Final    Comment:    For patients >52 years of age, the reference limit for Creatinine is approximately 13% higher for people identified as African-American. Marland Kitchen   06/19/2019 1.90 (H) 0.70 - 1.25 mg/dL Final    Comment:    For patients >16 years of age, the reference limit for Creatinine is approximately 13% higher for people identified as African-American. Marland Kitchen   05/29/2019 1.61 (H) 0.70 - 1.25 mg/dL Final    Comment:    For patients >15 years of age, the reference limit for Creatinine is approximately 13% higher for people identified as African-American. .    Creatinine, Ser  Date Value Ref Range Status  05/16/2020 8.99 (H) 0.61 - 1.24 mg/dL Final     EKG EKG Interpretation  Date/Time:  Friday May 16 2020 13:36:11 EDT Ventricular Rate:  49 PR Interval:  156 QRS Duration: 104 QT Interval:  484 QTC Calculation: 437 R Axis:   89 Text Interpretation: Sinus bradycardia Biatrial enlargement Left ventricular hypertrophy ( Sokolow-Lyon , Cornell product , Romhilt-Estes ) T wave  abnormality, consider inferior ischemia Abnormal ECG No previous tracing Confirmed by Dorie Rank [phone]) on 05/16/2020 5:04:49 PM   Radiology CT Renal Stone Study  Result Date: 05/16/2020 CLINICAL DATA:  Left-sided abdominal pain and vomiting for 2 days. Nephrolithiasis. Acute renal failure. EXAM: CT ABDOMEN AND PELVIS WITHOUT CONTRAST TECHNIQUE: Multidetector CT imaging of the abdomen and pelvis was performed following the standard protocol without IV contrast. COMPARISON:  01/14/2016 from [facility] FINDINGS: Lower Chest: No acute findings. Hepatobiliary: No hepatic masses identified. Gallstones are seen, however there is no evidence of cholecystitis or biliary dilatation. Pancreas: Stable postop changes from distal pancreatectomy. No evidence of mass or inflammatory changes. Spleen: Surgically absent. Adrenals/Urinary Tract: A few small fluid attenuation cyst noted in the right kidney. Mild left renal parenchymal scarring again seen. Multiple calculi are seen in the lower pole of the left kidney measuring up to 7 mm. A 4 mm calculus is seen in the distal left ureter near the UVJ, however there is no evidence of acute hydroureteronephrosis. Mild diffuse bladder wall thickening is seen with hazy opacity in the perivesical fat, suspicious for cystitis. Stomach/Bowel: No evidence of obstruction, inflammatory process or abnormal fluid collections. Left abdominal surgical wall defect again seen, as well as partial gastrectomy and right lower quadrant colostomy. Vascular/Lymphatic: No pathologically enlarged lymph nodes. No abdominal aortic aneurysm. Aortic atherosclerosis noted. Reproductive:  No mass or other significant abnormality. Other:  None. Musculoskeletal:  No suspicious bone lesions identified. IMPRESSION: 1. 4 mm distal left ureteral calculus near the UVJ, without significant hydroureteronephrosis. 2. Left nephrolithiasis. 3. Mild diffuse bladder wall thickening with hazy opacity in  perivesical fat, suspicious for cystitis. Suggest correlation with urinalysis. 4. Cholelithiasis. No radiographic evidence of cholecystitis. Aortic  Atherosclerosis (ICD10-I70.0). Electronically Signed   By: Danae Orleans M.D.   On: 05/16/2020 17:51    Procedures .Critical Care Performed by: Anselm Pancoast, PA-C Authorized by: Anselm Pancoast, PA-C   Critical care provider statement:    Critical care time (minutes):  35   Critical care time was exclusive of:  Separately billable procedures and treating other patients   Critical care was necessary to treat or prevent imminent or life-threatening deterioration of the following conditions:  Renal failure   Critical care was time spent personally by me on the following activities:  Ordering and performing treatments and interventions, ordering and review of laboratory studies, ordering and review of radiographic studies, pulse oximetry, re-evaluation of patient's condition, review of old charts, discussions with consultants, development of treatment plan with patient or surrogate, evaluation of patient's response to treatment, obtaining history from patient or surrogate and examination of patient   I assumed direction of critical care for this patient from another provider in my specialty: no     (including critical care time)  Medications Ordered in ED Medications  dextrose 5 %-0.45 % sodium chloride infusion (has no administration in time range)  tamsulosin (FLOMAX) capsule 0.4 mg (has no administration in time range)  ondansetron (ZOFRAN) injection 4 mg (4 mg Intravenous Given 05/16/20 1835)  sodium chloride 0.9 % bolus 1,000 mL (1,000 mLs Intravenous New Bag/Given 05/16/20 1834)    ED Course  I have reviewed the triage vital signs and the nursing notes.  Pertinent labs & imaging results that were available during my care of the patient were reviewed by me and considered in my medical decision making (see chart for details).  Clinical Course as  of May 16 1953  Fri May 16, 2020  1920 Spoke with Dr. Hyman Hopes, nephrologist.  Stop his lisinopril and hydralazine. And Flomax 0.4mg  nightly. Start infusion of D5 1/2 NS at 100 mL/h. Place foley to monitor output. Urology consult in morning. Nephrology will see the patient tomorrow.   [SJ]  1936 Spoke with Dr. Sharl Ma, hospitalist.  Agrees to admit the patient.   [SJ]    Clinical Course User Index [SJ] Naela Nodal, Hillard Danker, PA-C   MDM Rules/Calculators/A&P                      Patient presents with nausea, vomiting, and inability to urinate. Afebrile, not tachycardic, not hypotensive, maintains excellent SPO2 on room air. No evidence of fluid overload on exam. I personally reviewed and interpreted the patient's labs and imaging studies. Patient has creatinine of 8.99 and BUN of 71, previously 1.72 and 21, respectively, on Feb 08, 2020. Bladder scan with 131 cc urine.  Patient reevaluated multiple times during ED course.  No additional complaints.  Patient admitted for further management.  Findings and plan of care discussed with Linwood Dibbles, MD.   Vitals:   05/16/20 1700 05/16/20 1730 05/16/20 1745 05/16/20 1800  BP: (!) 177/84 (!) 142/74  (!) 146/83  Pulse:   (!) 45 (!) 52  Resp:      Temp:      SpO2:   100% 100%  Weight:      Height:         Final Clinical Impression(s) / ED Diagnoses Final diagnoses:  Acute renal failure, unspecified acute renal failure type [facility])    Rx / DC Orders ED Discharge Orders    None       Concepcion Living 05/16/20 1954  Linwood Dibbles, MD 05/17/20 1504

## 2020-05-16 NOTE — ED Triage Notes (Signed)
C/o vomiting intermittent for 2 days, states he has not urinated for 2 days

## 2020-05-16 NOTE — H&P (Addendum)
TRH H&P    Patient Demographics:    Johnathan Baldwin, is a 64 y.o. male  MRN: [id number]  DOB - 01-11-1956  Admit Date - 05/16/2020  Referring MD/NP/PA: Harolyn Rutherford  Outpatient Primary MD for the patient is Boone, Velna Hatchet, MD  Patient coming from: Home  Chief complaint-generalized weakness   HPI:    Johnathan Baldwin  is a 64 y.o. male, with history of kidney stones, hypertension, GERD came to hospital with complaints of generalized weakness, decreased urination since Wednesday, nausea and vomiting.  Patient has history of hypertension and chronic kidney disease stage IIIb, he was recently seen by nephrology as outpatient and was started on lisinopril 1 week ago and chlorthalidone 3 weeks ago.  Today in the ED lab work showed creatinine 8.99, last creatinine was 1.92 as of 12/28/2019.  BUN is 71.  CT scan abdomen showed 4 mm distal left ureteral calculus near the UVJ without significant hydroureteronephrosis.  Shows left nephrolithiasis. Nephrology Dr. Hyman Hopes was consulted by ED provider.  Patient denies chest pain or shortness of breath. He only made little bit urine this morning.  Has not had urination since Wednesday. Denies diarrhea constipation Has history of chronic abdominal pain Denies dysuria or fever.   Review of systems:    In addition to the HPI above,    All other systems reviewed and are negative.    Past History of the following :    Past Medical History:  Diagnosis Date  . Chronic abdominal pain S/P ABD. SURG FROM INJURY  . GERD (gastroesophageal reflux disease)   . Heart murmur MILD- ASYMPTOMATIC  . History of kidney stones   . Hypertension   . Left ureteral calculus   . Lung nodule 03/2012   CT scan  RIGHT MIDDLE LOBE- BENIGN      Past Surgical History:  Procedure Laterality Date  . ABDOMINAL SURGERY AND COLOSTOMY  2000   WORK INJURY FROM MACHINE  . URETEROSCOPY  July 2013   W/  stent, s/p removal for left stone      Social History:      Social History   Tobacco Use  . Smoking status: Never Smoker  . Smokeless tobacco: Never Used  Substance Use Topics  . Alcohol use: No       Family History :     Family History  Problem Relation Age of Onset  . Hypertension Mother   . Hypertension Brother   . Hypertension Sister   . Hypertension Brother       Home Medications:   Prior to Admission medications   Medication Sig Start Date End Date Taking? Authorizing Provider  chlorthalidone (HYGROTON) 25 MG tablet Take 25 mg by mouth in the morning. 05/05/20  Yes [provider]  famotidine (PEPCID) 40 MG/5ML suspension TAKE 2.5ML BY MOUTH TWICE DAILY. Patient taking differently: Take 20 mg by mouth 2 (two) times daily.  04/18/20  Yes [place], Velna Hatchet, MD  fentaNYL (DURAGESIC) 25 MCG/HR Place 1 patch onto the skin every 3 (three) days. 04/18/20  Yes [place],  Velna Hatchet, MD  lisinopril (ZESTRIL) 10 MG tablet Take 10 mg by mouth in the morning. 05/09/20  Yes [provider]  metoprolol succinate (TOPROL-XL) 25 MG 24 hr tablet Take 1 tablet (25 mg total) by mouth daily. Patient taking differently: Take 25 mg by mouth in the morning.  06/21/19  Yes [place], Velna Hatchet, MD  potassium citrate (UROCIT-K) 10 MEQ (1080 MG) SR tablet Take 10 mEq by mouth 2 (two) times daily. 03/07/20  Yes [provider]  Vitamin D, Ergocalciferol, (DRISDOL) 1.25 MG (50000 UNIT) CAPS capsule Take 50,000 Units by mouth once a week. 04/18/20  Yes [provider]     Allergies:     Allergies  Allergen Reactions  . Norvasc [Amlodipine Besylate] Swelling     Physical Exam:   Vitals  Blood pressure (!) 145/82, pulse (!) 55, temperature 97.8 F (36.6 C), resp. rate 18, height 5\' 9"  (1.753 m), weight 65.3 kg, SpO2 100 %.  1.  General: Appears in no acute distress  2. Psychiatric: Alert, oriented x3, intact insight and judgment  3. Neurologic: Cranial  nerves II through XII grossly intact, no focal deficit noted  4. HEENMT:  Atraumatic normocephalic, extraocular muscles are intact  5. Respiratory : Clear to auscultation bilaterally, no wheezing or crackles auscultated  6. Cardiovascular : S1-S2, regular, no murmur auscultated  7. Gastrointestinal:  Abdomen is soft, nontender, no organomegaly      Data Review:    CBC Recent Labs  Lab 05/16/20 1441  WBC 8.0  HGB 13.8  HCT 43.4  PLT 369  MCV 99.8  MCH 31.7  MCHC 31.8  RDW 14.7   ------------------------------------------------------------------------------------------------------------------  Results for orders placed or performed during the hospital encounter of 05/16/20 (from the past 48 hour(s))  Lipase, blood     Status: Abnormal   Collection Time: 05/16/20  2:41 PM  Result Value Ref Range   Lipase 58 (H) 11 - 51 U/L    Comment: Performed at [facility], [address], [place], [place]  Comprehensive metabolic panel     Status: Abnormal   Collection Time: 05/16/20  2:41 PM  Result Value Ref Range   Sodium 137 135 - 145 mmol/L   Potassium 3.9 3.5 - 5.1 mmol/L   Chloride 97 (L) 98 - 111 mmol/L   CO2 27 22 - 32 mmol/L   Glucose, Bld 107 (H) 70 - 99 mg/dL    Comment: Glucose reference range applies only to samples taken after fasting for at least 8 hours.   BUN 71 (H) 8 - 23 mg/dL   Creatinine, Ser 05/18/20 (H) 0.61 - 1.24 mg/dL   Calcium 9.3 8.9 - 7.89 mg/dL   Total Protein 8.3 (H) 6.5 - 8.1 g/dL   Albumin 4.4 3.5 - 5.0 g/dL   AST 24 15 - 41 U/L   ALT 17 0 - 44 U/L   Alkaline Phosphatase 74 38 - 126 U/L   Total Bilirubin 1.0 0.3 - 1.2 mg/dL   GFR calc non Af Amer 6 (L) >60 mL/min   GFR calc Af Amer 7 (L) >60 mL/min   Anion gap 13 5 - 15    Comment: Performed at [facility], [address] [place], [place]  CBC     Status: None   Collection Time: 05/16/20  2:41 PM  Result Value Ref Range   WBC 8.0 4.0 - 10.5 K/uL   RBC 4.35 4.22 -  5.81 MIL/uL   Hemoglobin 13.8 13.0 -  17.0 g/dL   HCT 16.143.4 09.639.0 - 04.552.0 %   MCV 99.8 80.0 - 100.0 fL   MCH 31.7 26.0 - 34.0 pg   MCHC 31.8 30.0 - 36.0 g/dL   RDW 40.914.7 81.111.5 - 91.415.5 %   Platelets 369 150 - 400 K/uL   nRBC 0.0 0.0 - 0.2 %    Comment: Performed at [facility], [address] [place], [place] [address]  SARS Coronavirus 2 by RT PCR (hospital order, performed in [facility] hospital lab) Nasopharyngeal Nasopharyngeal Swab     Status: None   Collection Time: 05/16/20  5:51 PM   Specimen: Nasopharyngeal Swab  Result Value Ref Range   SARS Coronavirus 2 NEGATIVE NEGATIVE    Comment: (NOTE) SARS-CoV-2 target nucleic acids are NOT DETECTED. The SARS-CoV-2 RNA is generally detectable in upper and lower respiratory specimens during the acute phase of infection. The lowest concentration of SARS-CoV-2 viral copies this assay can detect is 250 copies / mL. A negative result does not preclude SARS-CoV-2 infection and should not be used as the sole basis for treatment or other patient management decisions.  A negative result may occur with improper specimen collection / handling, submission of specimen other than nasopharyngeal swab, presence of viral mutation(s) within the areas targeted by this assay, and inadequate number of viral copies (<250 copies / mL). A negative result must be combined with clinical observations, patient history, and epidemiological information. Fact Sheet for Patients:   BoilerBrush.com.cyhttps://www.fda.gov/media/136312/download Fact Sheet for Healthcare Providers: https://pope.com/https://www.fda.gov/media/136313/download This test is not yet approved or cleared  by the [place] FDA and has been authorized for detection and/or diagnosis of SARS-CoV-2 by FDA under an Emergency Use Authorization (EUA).  This EUA will remain in effect (meaning this test can be used) for the duration of the COVID-19 declaration under Section 564(b)(1) of the Act, 21 U.S.C. section 360bbb-3(b)(1), unless  the authorization is terminated or revoked sooner. Performed at [facility], [address] [place], [place] [address]     Chemistries  Recent Labs  Lab 05/16/20 1441  NA 137  K 3.9  CL 97*  CO2 27  GLUCOSE 107*  BUN 71*  CREATININE 8.99*  CALCIUM 9.3  AST 24  ALT 17  ALKPHOS 74  BILITOT 1.0   ------------------------------------------------------------------------------------------------------------------  ------------------------------------------------------------------------------------------------------------------ GFR: Estimated Creatinine Clearance: 8 mL/min (A) (by C-G formula based on SCr of 8.99 mg/dL (H)). Liver Function Tests: Recent Labs  Lab 05/16/20 1441  AST 24  ALT 17  ALKPHOS 74  BILITOT 1.0  PROT 8.3*  ALBUMIN 4.4   Recent Labs  Lab 05/16/20 1441  LIPASE 58*    --------------------------------------------------------------------------------------------------------------- Urine analysis:    Component Value Date/Time   COLORURINE YELLOW 12/04/2019 1157   APPEARANCEUR CLEAR 12/04/2019 1157   LABSPEC 1.015 12/04/2019 1157   PHURINE 7.0 12/04/2019 1157   GLUCOSEU NEGATIVE 12/04/2019 1157   HGBUR TRACE (A) 12/04/2019 1157   BILIRUBINUR NEGATIVE 04/10/2012 2345   BILIRUBINUR negative 04/10/2012 1357   KETONESUR NEGATIVE 12/04/2019 1157   PROTEINUR NEGATIVE 12/04/2019 1157   UROBILINOGEN 0.2 04/10/2012 2345   NITRITE NEGATIVE 12/04/2019 1157   LEUKOCYTESUR 2+ (A) 12/04/2019 1157      Imaging Results:    CT Renal Stone Study  Result Date: 05/16/2020 CLINICAL DATA:  Left-sided abdominal pain and vomiting for 2 days. Nephrolithiasis. Acute renal failure. EXAM: CT ABDOMEN AND PELVIS WITHOUT CONTRAST TECHNIQUE: Multidetector CT imaging of the abdomen and pelvis was performed following the standard protocol without IV contrast. COMPARISON:  01/14/2016 from [facility] FINDINGS: Lower Chest: No acute findings. Hepatobiliary:  No hepatic masses identified. Gallstones are seen, however there is no evidence of cholecystitis or biliary dilatation. Pancreas: Stable postop changes from distal pancreatectomy. No evidence of mass or inflammatory changes. Spleen: Surgically absent. Adrenals/Urinary Tract: A few small fluid attenuation cyst noted in the right kidney. Mild left renal parenchymal scarring again seen. Multiple calculi are seen in the lower pole of the left kidney measuring up to 7 mm. A 4 mm calculus is seen in the distal left ureter near the UVJ, however there is no evidence of acute hydroureteronephrosis. Mild diffuse bladder wall thickening is seen with hazy opacity in the perivesical fat, suspicious for cystitis. Stomach/Bowel: No evidence of obstruction, inflammatory process or abnormal fluid collections. Left abdominal surgical wall defect again seen, as well as partial gastrectomy and right lower quadrant colostomy. Vascular/Lymphatic: No pathologically enlarged lymph nodes. No abdominal aortic aneurysm. Aortic atherosclerosis noted. Reproductive:  No mass or other significant abnormality. Other:  None. Musculoskeletal:  No suspicious bone lesions identified. IMPRESSION: 1. 4 mm distal left ureteral calculus near the UVJ, without significant hydroureteronephrosis. 2. Left nephrolithiasis. 3. Mild diffuse bladder wall thickening with hazy opacity in perivesical fat, suspicious for cystitis. Suggest correlation with urinalysis. 4. Cholelithiasis. No radiographic evidence of cholecystitis. Aortic Atherosclerosis (ICD10-I70.0). Electronically Signed   By: Danae Orleans M.D.   On: 05/16/2020 17:51    My personal review of EKG: Rhythm NSR, biatrial enlargement, nonspecific T wave normalities   Assessment & Plan:    Active Problems:   AKI (acute kidney injury) (HCC)   1. Acute kidney injury-oliguric.  Bladder scan only showed 131 cc urine.  Likely has developed ATN.  Check urine sodium, urine creatinine, SPEP, renal  ultrasound.  He does have left ureteral stone but no significant hydroureteronephrosis.  Nephrology recommended urology consultation.  Will obtain urology consult in a.m.  Patient has been started on D5 half-normal saline at 100 mL/h as per nephrology recommendation.  Will hold lisinopril, chlorthalidone.  Will avoid nephrotoxins.  Foley catheter will be inserted.  Strict intake and output. 2. Hypertension- Continue Toprol-XL 25 mg p.o. daily.  Holding lisinopril and chlorthalidone as above. 3. ?  BPH-start Flomax 0.4 mg daily.    DVT Prophylaxis-   Heparin  AM Labs Ordered, also please review Full Orders  Family Communication: Admission, patients condition and plan of care including tests being ordered have been discussed with the patient who indicate understanding and agree with the plan and Code Status.  Code Status: Full code  Admission status: Observation/Inpatient :The appropriate admission status for this patient is INPATIENT. Inpatient status is judged to be reasonable and necessary in order to provide the required intensity of service to ensure the patient's safety. The patient's presenting symptoms, physical exam findings, and initial radiographic and laboratory data in the context of their chronic comorbidities is felt to place them at high risk for further clinical deterioration. Furthermore, it is not anticipated that the patient will be medically stable for discharge from the hospital within 2 midnights of admission. The following factors support the admission status of inpatient.     The patient's presenting symptoms include generalized weakness. The worrisome physical exam findings include none. The initial radiographic and laboratory data are worrisome because of acute kidney injury. The chronic co-morbidities include hypertension.       * I certify that at the point of admission it is my clinical judgment that the patient will require inpatient  hospital care spanning  beyond 2 midnights from the point of admission due to high intensity of service, high risk for further deterioration and high frequency of surveillance required.*  Time spent in minutes : 60 minutes   Rudine Rieger S Hamzeh Tall M.D

## 2020-05-16 NOTE — Telephone Encounter (Signed)
Patient called in with c/o night sweats, fatigue and being unable to void in the past 24hrs. I advised patient with symptoms of no urination in 24 hrs that I recommend that he go to the ER as soon as possible. Patient verbalized understanding.

## 2020-05-17 ENCOUNTER — Inpatient Hospital Stay (HOSPITAL_COMMUNITY): Payer: 59

## 2020-05-17 ENCOUNTER — Encounter (HOSPITAL_COMMUNITY)
Admission: EM | Disposition: A | Discharge status: Home / Self Care | Payer: Self-pay | Source: Home / Self Care | Attending: Family Medicine

## 2020-05-17 ENCOUNTER — Inpatient Hospital Stay (HOSPITAL_COMMUNITY): Payer: 59 | Admitting: Anesthesiology

## 2020-05-17 ENCOUNTER — Other Ambulatory Visit: Payer: Self-pay

## 2020-05-17 DIAGNOSIS — N179 Acute kidney failure, unspecified: Secondary | ICD-10-CM

## 2020-05-17 DIAGNOSIS — N135 Crossing vessel and stricture of ureter without hydronephrosis: Secondary | ICD-10-CM | POA: Diagnosis present

## 2020-05-17 DIAGNOSIS — N132 Hydronephrosis with renal and ureteral calculous obstruction: Secondary | ICD-10-CM

## 2020-05-17 DIAGNOSIS — K219 Gastro-esophageal reflux disease without esophagitis: Secondary | ICD-10-CM

## 2020-05-17 DIAGNOSIS — I10 Essential (primary) hypertension: Secondary | ICD-10-CM

## 2020-05-17 HISTORY — PX: CYSTOSCOPY W/ URETERAL STENT PLACEMENT: SHX1429

## 2020-05-17 LAB — URINALYSIS, ROUTINE W REFLEX MICROSCOPIC
Bacteria, UA: NONE SEEN
Bilirubin Urine: NEGATIVE
Glucose, UA: NEGATIVE mg/dL
Ketones, ur: NEGATIVE mg/dL
Nitrite: NEGATIVE
Protein, ur: 30 mg/dL — AB
Specific Gravity, Urine: 1.011 (ref 1.005–1.030)
WBC, UA: >50 WBC/hpf — ABNORMAL HIGH (ref 0–5)
pH: 5 (ref 5.0–8.0)

## 2020-05-17 LAB — COMPREHENSIVE METABOLIC PANEL
ALT: 15 U/L (ref 0–44)
AST: 26 U/L (ref 15–41)
Albumin: 3.7 g/dL (ref 3.5–5.0)
Alkaline Phosphatase: 60 U/L (ref 38–126)
Anion gap: 13 (ref 5–15)
BUN: 69 mg/dL — ABNORMAL HIGH (ref 8–23)
CO2: 22 mmol/L (ref 22–32)
Calcium: 8.8 mg/dL — ABNORMAL LOW (ref 8.9–10.3)
Chloride: 100 mmol/L (ref 98–111)
Creatinine, Ser: 7.41 mg/dL — ABNORMAL HIGH (ref 0.61–1.24)
GFR calc Af Amer: 8 mL/min — ABNORMAL LOW (ref >60)
GFR calc non Af Amer: 7 mL/min — ABNORMAL LOW (ref >60)
Glucose, Bld: 119 mg/dL — ABNORMAL HIGH (ref 70–99)
Potassium: 4 mmol/L (ref 3.5–5.1)
Sodium: 135 mmol/L (ref 135–145)
Total Bilirubin: 1.3 mg/dL — ABNORMAL HIGH (ref 0.3–1.2)
Total Protein: 7 g/dL (ref 6.5–8.1)

## 2020-05-17 LAB — CBC
HCT: 38.4 % — ABNORMAL LOW (ref 39.0–52.0)
Hemoglobin: 12 g/dL — ABNORMAL LOW (ref 13.0–17.0)
MCH: 30.9 pg (ref 26.0–34.0)
MCHC: 31.3 g/dL (ref 30.0–36.0)
MCV: 99 fL (ref 80.0–100.0)
Platelets: 146 10*3/uL — ABNORMAL LOW (ref 150–400)
RBC: 3.88 MIL/uL — ABNORMAL LOW (ref 4.22–5.81)
RDW: 14.4 % (ref 11.5–15.5)
WBC: 6.1 10*3/uL (ref 4.0–10.5)
nRBC: 0 % (ref 0.0–0.2)

## 2020-05-17 LAB — SODIUM, URINE, RANDOM: Sodium, Ur: 60 mmol/L

## 2020-05-17 LAB — CREATININE, URINE, RANDOM: Creatinine, Urine: 210.33 mg/dL

## 2020-05-17 LAB — SURGICAL PCR SCREEN
MRSA, PCR: NEGATIVE
Staphylococcus aureus: POSITIVE — AB

## 2020-05-17 SURGERY — CYSTOSCOPY, WITH RETROGRADE PYELOGRAM AND URETERAL STENT INSERTION
Anesthesia: Monitor Anesthesia Care | Laterality: Bilateral

## 2020-05-17 MED ORDER — ACETAMINOPHEN 650 MG RE SUPP: 650.0000 mg | Freq: Four times a day (QID) | RECTAL | Status: DC | PRN

## 2020-05-17 MED ORDER — HEPARIN SODIUM (PORCINE) 5000 UNIT/ML IJ SOLN
5000.0000 [IU] | Freq: Three times a day (TID) | INTRAMUSCULAR | Status: DC
  Administered 2020-05-17: 5000 [IU] via SUBCUTANEOUS
  Filled 2020-05-17: qty 1

## 2020-05-17 MED ORDER — STERILE WATER FOR IRRIGATION IR SOLN
Status: DC | PRN
  Administered 2020-05-17: 1000 mL

## 2020-05-17 MED ORDER — SODIUM CHLORIDE 0.9 % IR SOLN
Status: DC | PRN
  Administered 2020-05-17 (×2): 3000 mL

## 2020-05-17 MED ORDER — GLYCOPYRROLATE PF 0.2 MG/ML IJ SOSY
PREFILLED_SYRINGE | INTRAMUSCULAR | Status: DC | PRN
Start: 2020-05-17 — End: 2020-05-17
  Administered 2020-05-17: .2 mg via INTRAVENOUS

## 2020-05-17 MED ORDER — CHLORHEXIDINE GLUCONATE 0.12 % MT SOLN: 15.0000 mL | Freq: Once | OROMUCOSAL | Status: DC

## 2020-05-17 MED ORDER — CHLORHEXIDINE GLUCONATE CLOTH 2 % EX PADS
6.0000 | MEDICATED_PAD | Freq: Every day | CUTANEOUS | Status: DC
  Administered 2020-05-17 – 2020-05-18 (×2): 6 via TOPICAL

## 2020-05-17 MED ORDER — LIDOCAINE 2% (20 MG/ML) 5 ML SYRINGE
INTRAMUSCULAR | Status: AC
  Filled 2020-05-17: qty 5

## 2020-05-17 MED ORDER — PROPOFOL 10 MG/ML IV BOLUS
INTRAVENOUS | Status: DC | PRN
  Administered 2020-05-17: 150 mg via INTRAVENOUS

## 2020-05-17 MED ORDER — ONDANSETRON HCL 4 MG/2ML IJ SOLN
INTRAMUSCULAR | Status: AC
  Filled 2020-05-17: qty 2

## 2020-05-17 MED ORDER — METOCLOPRAMIDE HCL 5 MG/ML IJ SOLN
INTRAMUSCULAR | Status: AC
  Filled 2020-05-17: qty 2

## 2020-05-17 MED ORDER — DIATRIZOATE MEGLUMINE 30 % UR SOLN
URETHRAL | Status: DC | PRN
  Administered 2020-05-17: 17 mL via URETHRAL

## 2020-05-17 MED ORDER — EPHEDRINE SULFATE-NACL 50-0.9 MG/10ML-% IV SOSY
PREFILLED_SYRINGE | INTRAVENOUS | Status: DC | PRN
  Administered 2020-05-17 (×3): 5 mg via INTRAVENOUS

## 2020-05-17 MED ORDER — DIATRIZOATE MEGLUMINE 30 % UR SOLN
URETHRAL | Status: AC
  Filled 2020-05-17: qty 100

## 2020-05-17 MED ORDER — METOCLOPRAMIDE HCL 5 MG/ML IJ SOLN
INTRAMUSCULAR | Status: DC | PRN
Start: 2020-05-17 — End: 2020-05-17
  Administered 2020-05-17: 10 mg via INTRAVENOUS

## 2020-05-17 MED ORDER — SODIUM CHLORIDE 0.9 % IV SOLN: INTRAVENOUS | Status: DC | PRN

## 2020-05-17 MED ORDER — MIDAZOLAM HCL 2 MG/2ML IJ SOLN
INTRAMUSCULAR | Status: AC
  Filled 2020-05-17: qty 2

## 2020-05-17 MED ORDER — ONDANSETRON HCL 4 MG/2ML IJ SOLN
INTRAMUSCULAR | Status: DC | PRN
  Administered 2020-05-17: 4 mg via INTRAVENOUS

## 2020-05-17 MED ORDER — ACETAMINOPHEN 325 MG PO TABS: 650.0000 mg | ORAL_TABLET | Freq: Four times a day (QID) | ORAL | Status: DC | PRN

## 2020-05-17 MED ORDER — PROPOFOL 10 MG/ML IV BOLUS
INTRAVENOUS | Status: AC
  Filled 2020-05-17: qty 40

## 2020-05-17 MED ORDER — MIDAZOLAM HCL 5 MG/5ML IJ SOLN
INTRAMUSCULAR | Status: DC | PRN
  Administered 2020-05-17: 2 mg via INTRAVENOUS

## 2020-05-17 MED ORDER — DEXAMETHASONE SODIUM PHOSPHATE 10 MG/ML IJ SOLN
INTRAMUSCULAR | Status: DC | PRN
Start: 2020-05-17 — End: 2020-05-17
  Administered 2020-05-17: 5 mg via INTRAVENOUS

## 2020-05-17 MED ORDER — ORAL CARE MOUTH RINSE: 15.0000 mL | Freq: Once | OROMUCOSAL | Status: DC

## 2020-05-17 MED ORDER — HEPARIN SODIUM (PORCINE) 5000 UNIT/ML IJ SOLN
5000.0000 [IU] | Freq: Three times a day (TID) | INTRAMUSCULAR | Status: DC
  Administered 2020-05-18 – 2020-05-19 (×4): 5000 [IU] via SUBCUTANEOUS
  Filled 2020-05-17 (×4): qty 1

## 2020-05-17 MED ORDER — HYDROMORPHONE HCL 1 MG/ML IJ SOLN: 0.2500 mg | INTRAMUSCULAR | Status: DC | PRN

## 2020-05-17 MED ORDER — CEFAZOLIN SODIUM-DEXTROSE 2-3 GM-%(50ML) IV SOLR
INTRAVENOUS | Status: DC | PRN
Start: 2020-05-17 — End: 2020-05-17
  Administered 2020-05-17: 2 g via INTRAVENOUS

## 2020-05-17 MED ORDER — ONDANSETRON HCL 4 MG/2ML IJ SOLN: 4.0000 mg | Freq: Four times a day (QID) | INTRAMUSCULAR | Status: DC | PRN

## 2020-05-17 MED ORDER — CEFAZOLIN SODIUM-DEXTROSE 2-4 GM/100ML-% IV SOLN
2.0000 g | INTRAVENOUS | Status: AC
  Filled 2020-05-17: qty 100

## 2020-05-17 MED ORDER — FENTANYL CITRATE (PF) 250 MCG/5ML IJ SOLN
INTRAMUSCULAR | Status: AC
  Filled 2020-05-17: qty 5

## 2020-05-17 MED ORDER — FENTANYL CITRATE (PF) 100 MCG/2ML IJ SOLN
INTRAMUSCULAR | Status: DC | PRN
  Administered 2020-05-17 (×3): 50 ug via INTRAVENOUS

## 2020-05-17 MED ORDER — LIDOCAINE 2% (20 MG/ML) 5 ML SYRINGE
INTRAMUSCULAR | Status: DC | PRN
  Administered 2020-05-17: 80 mg via INTRAVENOUS

## 2020-05-17 MED ORDER — ONDANSETRON HCL 4 MG PO TABS: 4.0000 mg | ORAL_TABLET | Freq: Four times a day (QID) | ORAL | Status: DC | PRN

## 2020-05-17 MED ORDER — LACTATED RINGERS IV SOLN: Freq: Once | INTRAVENOUS | Status: AC

## 2020-05-17 MED ORDER — METOPROLOL SUCCINATE ER 25 MG PO TB24
25.0000 mg | ORAL_TABLET | Freq: Every day | ORAL | Status: DC
  Administered 2020-05-17 – 2020-05-19 (×3): 25 mg via ORAL
  Filled 2020-05-17 (×3): qty 1

## 2020-05-17 MED ORDER — DEXAMETHASONE SODIUM PHOSPHATE 10 MG/ML IJ SOLN
INTRAMUSCULAR | Status: AC
  Filled 2020-05-17: qty 1

## 2020-05-17 SURGICAL SUPPLY — 23 items
BAG DRAIN URO TABLE W/ADPT NS (BAG) ×3 IMPLANT
BAG DRN 8 ADPR NS SKTRN CSTL (BAG) ×1
BAG HAMPER (MISCELLANEOUS) ×3 IMPLANT
CATH FOLEY 2WAY SLVR  5CC 16FR (CATHETERS) ×3
CATH FOLEY 2WAY SLVR 5CC 16FR (CATHETERS) IMPLANT
CATH INTERMIT  6FR 70CM (CATHETERS) ×3 IMPLANT
CLOTH BEACON ORANGE TIMEOUT ST (SAFETY) ×3 IMPLANT
DECANTER SPIKE VIAL GLASS SM (MISCELLANEOUS) ×3 IMPLANT
GLOVE BIO SURGEON STRL SZ8 (GLOVE) ×3 IMPLANT
GLOVE BIOGEL PI IND STRL 7.0 (GLOVE) ×2 IMPLANT
GLOVE BIOGEL PI INDICATOR 7.0 (GLOVE) ×4
GOWN STRL REUS W/TWL LRG LVL3 (GOWN DISPOSABLE) ×3 IMPLANT
GOWN STRL REUS W/TWL XL LVL3 (GOWN DISPOSABLE) ×3 IMPLANT
GUIDEWIRE STR ZIPWIRE 035X150 (MISCELLANEOUS) ×3 IMPLANT
IV NS IRRIG 3000ML ARTHROMATIC (IV SOLUTION) ×5 IMPLANT
KIT TURNOVER CYSTO (KITS) ×3 IMPLANT
MANIFOLD NEPTUNE II (INSTRUMENTS) ×3 IMPLANT
PAD ARMBOARD 7.5X6 YLW CONV (MISCELLANEOUS) ×3 IMPLANT
STENT URET 6FRX26 CONTOUR (STENTS) ×4 IMPLANT
SYR 10ML LL (SYRINGE) ×3 IMPLANT
TOWEL OR 17X26 4PK STRL BLUE (TOWEL DISPOSABLE) ×3 IMPLANT
TRAY CYSTO PACK (CUSTOM PROCEDURE TRAY) ×3 IMPLANT
WATER STERILE IRR 500ML POUR (IV SOLUTION) ×3 IMPLANT

## 2020-05-17 NOTE — Consult Note (Signed)
Urology Consult  Referring physician: Dr. Hulan Saas Reason for referral: ureteral calculus, acute renal failure  Chief Complaint: left flank pain  History of Present Illness: Johnathan Baldwin is a 64yo with a hx of nephrolithiasis who presented to the ER yesterday with fatigue, nausea vomiting and was found to have a creatinine of 9. He underwent CT which showed a left distal ureteral calculus with mild hydronephrosis. He also has a right distal ureteral calculus. The pain is sharp, intermittent, mild and nonraditing. Creatine 7.5. no fevers/chills/sweats. No significant LUTS  Past Medical History:  Diagnosis Date  . Chronic abdominal pain S/P ABD. SURG FROM INJURY  . GERD (gastroesophageal reflux disease)   . Heart murmur MILD- ASYMPTOMATIC  . History of kidney stones   . Hypertension   . Left ureteral calculus   . Lung nodule 03/2012   CT scan  RIGHT MIDDLE LOBE- BENIGN   Past Surgical History:  Procedure Laterality Date  . ABDOMINAL SURGERY AND COLOSTOMY  2000   WORK INJURY FROM MACHINE  . URETEROSCOPY  July 2013   W/ stent, s/p removal for left stone    Medications: I have reviewed the patient's current medications. Allergies:  Allergies  Allergen Reactions  . Norvasc [Amlodipine Besylate] Swelling    Family History  Problem Relation Age of Onset  . Hypertension Mother   . Hypertension Brother   . Hypertension Sister   . Hypertension Brother    Social History:  reports that he has never smoked. He has never used smokeless tobacco. He reports that he does not drink alcohol or use drugs.  Review of Systems  Gastrointestinal: Positive for nausea and vomiting.  Genitourinary: Positive for flank pain.  All other systems reviewed and are negative.   Physical Exam:  Vital signs in last 24 hours: Temp:  [98.3 F (36.8 C)-98.7 F (37.1 C)] 98.3 F (36.8 C) (05/29 1000) Pulse Rate:  [45-66] 64 (05/29 1000) Resp:  [16] 16 (05/29 1000) BP: (105-177)/(61-106) 124/61 (05/29  1000) SpO2:  [95 %-100 %] 100 % (05/29 1000) Physical Exam  Constitutional: He is oriented to person, place, and time. He appears well-developed and well-nourished.  HENT:  Head: Normocephalic and atraumatic.  Eyes: Pupils are equal, round, and reactive to light. EOM are normal.  Neck: No thyromegaly present.  Cardiovascular: Normal rate and regular rhythm.  Respiratory: Effort normal. No respiratory distress.  GI: Soft. He exhibits no distension.  Musculoskeletal:        General: No edema. Normal range of motion.     Cervical back: Normal range of motion.  Neurological: He is alert and oriented to person, place, and time.  Skin: Skin is warm and dry.  Psychiatric: He has a normal mood and affect. His behavior is normal. Judgment and thought content normal.    Laboratory Data:  Results for orders placed or performed during the hospital encounter of 05/16/20 (from the past 72 hour(s))  Lipase, blood     Status: Abnormal   Collection Time: 05/16/20  2:41 PM  Result Value Ref Range   Lipase 58 (H) 11 - 51 U/L    Comment: Performed at [facility], [address] [place], [place] [address]  Comprehensive metabolic panel     Status: Abnormal   Collection Time: 05/16/20  2:41 PM  Result Value Ref Range   Sodium 137 135 - 145 mmol/L   Potassium 3.9 3.5 - 5.1 mmol/L   Chloride 97 (L) 98 - 111 mmol/L   CO2 27 22 -  32 mmol/L   Glucose, Bld 107 (H) 70 - 99 mg/dL    Comment: Glucose reference range applies only to samples taken after fasting for at least 8 hours.   BUN 71 (H) 8 - 23 mg/dL   Creatinine, Ser 1.01 (H) 0.61 - 1.24 mg/dL   Calcium 9.3 8.9 - 75.1 mg/dL   Total Protein 8.3 (H) 6.5 - 8.1 g/dL   Albumin 4.4 3.5 - 5.0 g/dL   AST 24 15 - 41 U/L   ALT 17 0 - 44 U/L   Alkaline Phosphatase 74 38 - 126 U/L   Total Bilirubin 1.0 0.3 - 1.2 mg/dL   GFR calc non Af Amer 6 (L) >60 mL/min   GFR calc Af Amer 7 (L) >60 mL/min   Anion gap 13 5 - 15    Comment: Performed at [facility], [address], [place], [place] [address]  CBC     Status: None   Collection Time: 05/16/20  2:41 PM  Result Value Ref Range   WBC 8.0 4.0 - 10.5 K/uL   RBC 4.35 4.22 - 5.81 MIL/uL   Hemoglobin 13.8 13.0 - 17.0 g/dL   HCT 27.7 82.4 - 23.5 %   MCV 99.8 80.0 - 100.0 fL   MCH 31.7 26.0 - 34.0 pg   MCHC 31.8 30.0 - 36.0 g/dL   RDW 36.1 44.3 - 15.4 %   Platelets 369 150 - 400 K/uL   nRBC 0.0 0.0 - 0.2 %    Comment: Performed at [facility], [address], [place], [place] [address]  SARS Coronavirus 2 by RT PCR (hospital order, performed in [facility] hospital lab) Nasopharyngeal Nasopharyngeal Swab     Status: None   Collection Time: 05/16/20  5:51 PM   Specimen: Nasopharyngeal Swab  Result Value Ref Range   SARS Coronavirus 2 NEGATIVE NEGATIVE    Comment: (NOTE) SARS-CoV-2 target nucleic acids are NOT DETECTED. The SARS-CoV-2 RNA is generally detectable in upper and lower respiratory specimens during the acute phase of infection. The lowest concentration of SARS-CoV-2 viral copies this assay can detect is 250 copies / mL. A negative result does not preclude SARS-CoV-2 infection and should not be used as the sole basis for treatment or other patient management decisions.  A negative result may occur with improper specimen collection / handling, submission of specimen other than nasopharyngeal swab, presence of viral mutation(s) within the areas targeted by this assay, and inadequate number of viral copies (<250 copies / mL). A negative result must be combined with clinical observations, patient history, and epidemiological information. Fact Sheet for Patients:   BoilerBrush.com.cy Fact Sheet for Healthcare Providers: https://pope.com/ This test is not yet approved or cleared  by the [place] FDA and has been authorized for detection and/or diagnosis of SARS-CoV-2 by FDA under an Emergency Use Authorization (EUA).  This EUA  will remain in effect (meaning this test can be used) for the duration of the COVID-19 declaration under Section 564(b)(1) of the Act, 21 U.S.C. section 360bbb-3(b)(1), unless the authorization is terminated or revoked sooner. Performed at [facility], [address] [place], [place] [address]   Urinalysis, Routine w reflex microscopic     Status: Abnormal   Collection Time: 05/17/20 12:03 AM  Result Value Ref Range   Color, Urine YELLOW YELLOW   APPearance HAZY (A) CLEAR   Specific Gravity, Urine 1.011 1.005 - 1.030   pH 5.0 5.0 - 8.0   Glucose, UA NEGATIVE NEGATIVE mg/dL  Hgb urine dipstick MODERATE (A) NEGATIVE   Bilirubin Urine NEGATIVE NEGATIVE   Ketones, ur NEGATIVE NEGATIVE mg/dL   Protein, ur 30 (A) NEGATIVE mg/dL   Nitrite NEGATIVE NEGATIVE   Leukocytes,Ua MODERATE (A) NEGATIVE   RBC / HPF 0-5 0 - 5 RBC/hpf   WBC, UA >50 (H) 0 - 5 WBC/hpf   Bacteria, UA NONE SEEN NONE SEEN   Squamous Epithelial / LPF 0-5 0 - 5   Mucus PRESENT     Comment: Performed at [facility], [address], [place], [place] [address]  Sodium, urine, random     Status: None   Collection Time: 05/17/20 12:03 AM  Result Value Ref Range   Sodium, Ur 60 mmol/L    Comment: Performed at [facility], [address] [place], [place] [address]  Creatinine, urine, random     Status: None   Collection Time: 05/17/20 12:03 AM  Result Value Ref Range   Creatinine, Urine 210.33 mg/dL    Comment: Performed at [facility], [address] [place], [place] [address]  CBC     Status: Abnormal   Collection Time: 05/17/20  8:49 AM  Result Value Ref Range   WBC 6.1 4.0 - 10.5 K/uL   RBC 3.88 (L) 4.22 - 5.81 MIL/uL   Hemoglobin 12.0 (L) 13.0 - 17.0 g/dL   HCT 02.5 (L) 42.7 - 06.2 %   MCV 99.0 80.0 - 100.0 fL   MCH 30.9 26.0 - 34.0 pg   MCHC 31.3 30.0 - 36.0 g/dL   RDW 37.6 28.3 - 15.1 %   Platelets 146 (L) 150 - 400 K/uL   nRBC 0.0 0.0 - 0.2 %    Comment: Performed at [facility], [address],  [place], [place] [address]  Comprehensive metabolic panel     Status: Abnormal   Collection Time: 05/17/20  8:49 AM  Result Value Ref Range   Sodium 135 135 - 145 mmol/L   Potassium 4.0 3.5 - 5.1 mmol/L   Chloride 100 98 - 111 mmol/L   CO2 22 22 - 32 mmol/L   Glucose, Bld 119 (H) 70 - 99 mg/dL    Comment: Glucose reference range applies only to samples taken after fasting for at least 8 hours.   BUN 69 (H) 8 - 23 mg/dL   Creatinine, Ser 7.37 (H) 0.61 - 1.24 mg/dL   Calcium 8.8 (L) 8.9 - 10.3 mg/dL   Total Protein 7.0 6.5 - 8.1 g/dL   Albumin 3.7 3.5 - 5.0 g/dL   AST 26 15 - 41 U/L   ALT 15 0 - 44 U/L   Alkaline Phosphatase 60 38 - 126 U/L   Total Bilirubin 1.3 (H) 0.3 - 1.2 mg/dL   GFR calc non Af Amer 7 (L) >60 mL/min   GFR calc Af Amer 8 (L) >60 mL/min   Anion gap 13 5 - 15    Comment: Performed at [facility], [address], [place], [place] [address]   Recent Results (from the past 240 hour(s))  SARS Coronavirus 2 by RT PCR (hospital order, performed in [facility] hospital lab) Nasopharyngeal Nasopharyngeal Swab     Status: None   Collection Time: 05/16/20  5:51 PM   Specimen: Nasopharyngeal Swab  Result Value Ref Range Status   SARS Coronavirus 2 NEGATIVE NEGATIVE Final    Comment: (NOTE) SARS-CoV-2 target nucleic acids are NOT DETECTED. The SARS-CoV-2 RNA is generally detectable in upper and lower respiratory specimens during the acute phase of  infection. The lowest concentration of SARS-CoV-2 viral copies this assay can detect is 250 copies / mL. A negative result does not preclude SARS-CoV-2 infection and should not be used as the sole basis for treatment or other patient management decisions.  A negative result may occur with improper specimen collection / handling, submission of specimen other than nasopharyngeal swab, presence of viral mutation(s) within the areas targeted by this assay, and inadequate number of viral copies (<250 copies / mL). A negative result must  be combined with clinical observations, patient history, and epidemiological information. Fact Sheet for Patients:   BoilerBrush.com.cy Fact Sheet for Healthcare Providers: https://pope.com/ This test is not yet approved or cleared  by the [place] FDA and has been authorized for detection and/or diagnosis of SARS-CoV-2 by FDA under an Emergency Use Authorization (EUA).  This EUA will remain in effect (meaning this test can be used) for the duration of the COVID-19 declaration under Section 564(b)(1) of the Act, 21 U.S.C. section 360bbb-3(b)(1), unless the authorization is terminated or revoked sooner. Performed at [facility], [address] [place], [place] [address]    Creatinine: Recent Labs    05/16/20 1441 05/17/20 0849  CREATININE 8.99* 7.41*   Baseline Creatinine: 1.2  Impression/Assessment:  63yo with bilateral ureteral calculi, acute renal failure  Plan:  The risks/benefits/alternatives to bilateral ureteral stent placement was explained to the patient and he understands and wishes to proceed with surgery  Wilkie Aye 05/17/2020, 4:36 PM

## 2020-05-17 NOTE — Op Note (Signed)
Preoperative diagnosis: bilateral ureteral calculi, acute renal failure  Postoperative diagnosis: Same  Procedure: 1 cystoscopy 2. bilateralretrograde pyelography 3.  Intraoperative fluoroscopy, under one hour, with interpretation 4.  bilateral 6 x 26 JJ stent placement  Attending: Cleda Mccreedy  Anesthesia: General  Estimated blood loss: None  Drains: bilateral 6 x 26 JJ ureteral stent without tether  Specimens: none  Antibiotics: ancef  Findings: bilateral ureteral stones. Mild bilateral hydronephrosis. No masses/lesions in the bladder. Ureteral orifices in normal anatomic location.  Indications: Patient is a 64 year old male with a history of bilateral ureteral stone and elevated creatinine. After discussing treatment options, they decided proceed with bilateral stent placement.  Procedure her in detail: The patient was brought to the operating room and a brief timeout was done to ensure correct patient, correct procedure, correct site.  General anesthesia was administered patient was placed in dorsal lithotomy position.  Her genitalia was then prepped and draped in usual sterile fashion.  A rigid 22 French cystoscope was passed in the urethra and the bladder.  Bladder was inspected free masses or lesions.  the ureteral orifices were in the normal orthotopic locations. a 6 french ureteral catheter was then instilled into the left ureteral orifice.  a gentle retrograde was obtained and findings noted above. We then advanced a zipwire up to the renal pelvis. We then placed a 6 x 26 double-j ureteral stent over the zip wire. We then removed the wire and good coil was noted in the the renal pelvis under fluoroscopy and the bladder under direct vision.  We then turned out attention to the right side.  a gentle retrograde was obtained and findings noted above. We then advanced a zipwire up to the renal pelvis. we then placed a 6 x 26 double-j ureteral stent over the original zip wire.  We  then removed the wire and good coil was noted in the the renal pelvis under fluoroscopy and the bladder under direct vision.  the bladder was then drained and this concluded the procedure which was well tolerated by patient.  Complications: None  Condition: Stable, extubated, transferred to PACU  Plan: Patient will be scheduled in 2-3 weeks for stone extraction.

## 2020-05-17 NOTE — Progress Notes (Signed)
PROGRESS NOTE   Johnathan Baldwin  JQB:[id number] DOB: July 01, 1956 DOA: 05/16/2020 PCP: Salley Scarlet, MD   Chief Complaint  Patient presents with  . Abdominal Pain    Brief Admission History:  64 y.o. male, with history of kidney stones, hypertension, GERD came to hospital with complaints of generalized weakness, decreased urination since Wednesday, nausea and vomiting.  Patient has history of hypertension and chronic kidney disease stage IIIb, he was recently seen by nephrology as outpatient and was started on lisinopril 1 week ago and chlorthalidone 3 weeks ago.  Today in the ED lab work showed creatinine 8.99, last creatinine was 1.92 as of 12/28/2019.  BUN is 71.  CT scan abdomen showed 4 mm distal left ureteral calculus near the UVJ without significant hydroureteronephrosis.  CT with left and right distal ureteral nephrolithiasis.  Urologist Dr.  Ronne Binning consulted for operative management.  Nephrology Dr. Hyman Hopes was consulted by ED provider.   Assessment & Plan:   Principal Problem:   Bilateral ureteral obstruction Active Problems:   HTN (hypertension), benign   GERD (gastroesophageal reflux disease)   AKI (acute kidney injury) (HCC)   1. Bilateral ureteral obstruction-spoke with Dr. Ronne Binning with urology was planning on operative management later today.  Continue current management n.p.o. for now. 2. Essential hypertension-resume home medications and follow. 3. AKI-appreciate nephrology consultation. 4. GERD-Protonix for GI protection.  DVT prophylaxis: Subcu heparin Code Status: Full Family Communication: I updated patient regarding the situation and plan of care at bedside and he verbalized understanding. Disposition:   Status is: Inpatient  Remains inpatient appropriate because:Persistent severe electrolyte disturbances and IV treatments appropriate due to intensity of illness or inability to take PO   Dispo: The patient is from: Home              Anticipated d/c is  to: Home              Anticipated d/c date is: 2 days              Patient currently is not medically stable to d/c.  Consultants:   Nephrology  Urology  Procedures:     Antimicrobials:     Subjective: Patient without complaints  Objective: Vitals:   05/16/20 2326 05/17/20 0014 05/17/20 0408 05/17/20 1000  BP: 133/78 125/63 105/65 124/61  Pulse: (!) 54 (!) 55 (!) 55 64  Resp: 16 16 16 16   Temp:  98.7 F (37.1 C) 98.3 F (36.8 C) 98.3 F (36.8 C)  TempSrc:  Oral Oral Oral  SpO2: 100% 100% 99% 100%  Weight:      Height:        Intake/Output Summary (Last 24 hours) at 05/17/2020 1553 Last data filed at 05/17/2020 1532 Gross per 24 hour  Intake 2424.67 ml  Output 800 ml  Net 1624.67 ml   Filed Weights   05/16/20 1345  Weight: 65.3 kg    Examination:  General exam: Appears calm and comfortable  Respiratory system: Clear to auscultation. Respiratory effort normal. Cardiovascular system: S1 & S2 heard, RRR. No JVD, murmurs, rubs, gallops or clicks. No pedal edema. Gastrointestinal system: Abdomen is nondistended, soft and nontender. No organomegaly or masses felt. Normal bowel sounds heard. Central nervous system: Alert and oriented. No focal neurological deficits. Extremities: Symmetric 5 x 5 power. Skin: No rashes, lesions or ulcers Psychiatry: Judgement and insight appear normal. Mood & affect appropriate.   Data Reviewed: I have personally reviewed following labs and imaging studies  CBC: Recent Labs  Lab 05/16/20 1441 05/17/20 0849  WBC 8.0 6.1  HGB 13.8 12.0*  HCT 43.4 38.4*  MCV 99.8 99.0  PLT 369 146*    Basic Metabolic Panel: Recent Labs  Lab 05/16/20 1441 05/17/20 0849  NA 137 135  K 3.9 4.0  CL 97* 100  CO2 27 22  GLUCOSE 107* 119*  BUN 71* 69*  CREATININE 8.99* 7.41*  CALCIUM 9.3 8.8*    GFR: Estimated Creatinine Clearance: 9.7 mL/min (A) (by C-G formula based on SCr of 7.41 mg/dL (H)).  Liver Function Tests: Recent Labs    Lab 05/16/20 1441 05/17/20 0849  AST 24 26  ALT 17 15  ALKPHOS 74 60  BILITOT 1.0 1.3*  PROT 8.3* 7.0  ALBUMIN 4.4 3.7    CBG: No results for input(s): GLUCAP in the last 168 hours.  Recent Results (from the past 240 hour(s))  SARS Coronavirus 2 by RT PCR (hospital order, performed in [facility] hospital lab) Nasopharyngeal Nasopharyngeal Swab     Status: None   Collection Time: 05/16/20  5:51 PM   Specimen: Nasopharyngeal Swab  Result Value Ref Range Status   SARS Coronavirus 2 NEGATIVE NEGATIVE Final    Comment: (NOTE) SARS-CoV-2 target nucleic acids are NOT DETECTED. The SARS-CoV-2 RNA is generally detectable in upper and lower respiratory specimens during the acute phase of infection. The lowest concentration of SARS-CoV-2 viral copies this assay can detect is 250 copies / mL. A negative result does not preclude SARS-CoV-2 infection and should not be used as the sole basis for treatment or other patient management decisions.  A negative result may occur with improper specimen collection / handling, submission of specimen other than nasopharyngeal swab, presence of viral mutation(s) within the areas targeted by this assay, and inadequate number of viral copies (<250 copies / mL). A negative result must be combined with clinical observations, patient history, and epidemiological information. Fact Sheet for Patients:   StrictlyIdeas.no Fact Sheet for Healthcare Providers: BankingDealers.co.za This test is not yet approved or cleared  by the [place] FDA and has been authorized for detection and/or diagnosis of SARS-CoV-2 by FDA under an Emergency Use Authorization (EUA).  This EUA will remain in effect (meaning this test can be used) for the duration of the COVID-19 declaration under Section 564(b)(1) of the Act, 21 U.S.C. section 360bbb-3(b)(1), unless the authorization is terminated or revoked sooner. Performed at  [facility], [address] [place], [place] [address]      Radiology Studies: US RENAL  Result Date: 05/17/2020 CLINICAL DATA:  Acute kidney injury on admission. EXAM: RENAL / URINARY TRACT ULTRASOUND COMPLETE COMPARISON:  CT renal stone protocol 05/16/2020 Hg FINDINGS: Right Kidney: Renal measurements: 9.8 x 3.6 x 6.0 cm = volume: 172 mL . Echogenicity within normal limits. There are two renal cysts measuring up to 1.8 cm. No hydronephrosis. Left Kidney: Renal measurements: 10.4 x 5.0 x 4.1 cm = volume: 112 mL. Echogenicity within normal limits. There is one renal cyst measuring 1.6 cm. There are multiple shadowing renal calculi measuring up to 0.8 cm. No hydronephrosis. Bladder: Decompressed with Foley catheter in place. Other: None. IMPRESSION: 1.  Left renal calculi.  No hydronephrosis bilaterally. 2.  Bilateral renal cysts. Electronically Signed   By: Audie Pinto M.D.   On: 05/17/2020 11:01   CT Renal Stone Study  Result Date: 05/16/2020 CLINICAL DATA:  Left-sided abdominal pain and vomiting for 2 days. Nephrolithiasis. Acute renal failure. EXAM: CT ABDOMEN AND PELVIS WITHOUT CONTRAST TECHNIQUE: Multidetector  CT imaging of the abdomen and pelvis was performed following the standard protocol without IV contrast. COMPARISON:  01/14/2016 from [facility] FINDINGS: Lower Chest: No acute findings. Hepatobiliary: No hepatic masses identified. Gallstones are seen, however there is no evidence of cholecystitis or biliary dilatation. Pancreas: Stable postop changes from distal pancreatectomy. No evidence of mass or inflammatory changes. Spleen: Surgically absent. Adrenals/Urinary Tract: A few small fluid attenuation cyst noted in the right kidney. Mild left renal parenchymal scarring again seen. Multiple calculi are seen in the lower pole of the left kidney measuring up to 7 mm. A 4 mm calculus is seen in the distal left ureter near the UVJ, however there is no evidence of acute  hydroureteronephrosis. Mild diffuse bladder wall thickening is seen with hazy opacity in the perivesical fat, suspicious for cystitis. Stomach/Bowel: No evidence of obstruction, inflammatory process or abnormal fluid collections. Left abdominal surgical wall defect again seen, as well as partial gastrectomy and right lower quadrant colostomy. Vascular/Lymphatic: No pathologically enlarged lymph nodes. No abdominal aortic aneurysm. Aortic atherosclerosis noted. Reproductive:  No mass or other significant abnormality. Other:  None. Musculoskeletal:  No suspicious bone lesions identified. IMPRESSION: 1. 4 mm distal left ureteral calculus near the UVJ, without significant hydroureteronephrosis. 2. Left nephrolithiasis. 3. Mild diffuse bladder wall thickening with hazy opacity in perivesical fat, suspicious for cystitis. Suggest correlation with urinalysis. 4. Cholelithiasis. No radiographic evidence of cholecystitis. Aortic Atherosclerosis (ICD10-I70.0). Electronically Signed   By: Danae Orleans M.D.   On: 05/16/2020 17:51   Scheduled Meds: . Chlorhexidine Gluconate Cloth  6 each Topical Daily  . [START ON 05/18/2020] heparin  5,000 Units Subcutaneous Q8H  . metoprolol succinate  25 mg Oral Daily  . tamsulosin  0.4 mg Oral QHS   Continuous Infusions: .  ceFAZolin (ANCEF) IV    . dextrose 5 % and 0.45% NaCl 75 mL/hr at 05/17/20 1449     LOS: 1 day   Time spent: 31 minutes    Lucus Lambertson Laural Benes, MD How to contact the [facility] Attending or Consulting provider 7A - 7P or covering provider during after hours 7P -7A, for this patient?  1. Check the care team in [facility] and look for a) attending/consulting [facility] provider listed and b) the [facility] team listed 2. Log into [email or web address] and use [place]'s universal password to access. If you do not have the password, please contact the hospital operator. 3. Locate the [facility] provider you are looking for under [facility] and page to a number that you can be directly  reached. 4. If you still have difficulty reaching the provider, please page the [facility] (Director on Call) for the Hospitalists listed on amion for assistance.  05/17/2020, 3:53 PM

## 2020-05-17 NOTE — Anesthesia Preprocedure Evaluation (Addendum)
Anesthesia Evaluation  Patient identified by MRN, date of birth, ID band Patient awake    Reviewed: Allergy & Precautions, NPO status , Patient's Chart, lab work & pertinent test results, reviewed documented beta blocker date and time   History of Anesthesia Complications Negative for: history of anesthetic complications  Airway Mallampati: IV   Neck ROM: Full  Mouth opening: Limited Mouth Opening Comment: Difficult airway Dental  (+) Teeth Intact, Dental Advisory Given   Pulmonary Current Smoker (occasional cigars) and Patient abstained from smoking.,  H/o tracheostomy  H/o tracheostomy  breath sounds clear to auscultation       Cardiovascular Exercise Tolerance: Good hypertension, Pt. on medications and Pt. on home beta blockers + Valvular Problems/Murmurs  Rhythm:Regular Rate:Bradycardia - Systolic murmurs, - Diastolic murmurs, - Friction Rub, - Carotid Bruit, - Peripheral Edema and - Systolic Click [id number] 56:31:49 [facility]-ED ROUTINE RECORD Sinus bradycardia Biatrial enlargement Left ventricular hypertrophy ( Sokolow-Lyon , Cornell product , Romhilt-Estes ) T wave abnormality, consider inferior ischemia Abnormal ECG No previous tracing Confirmed by Dorie Rank [phone]) on 05/16/2020 5:04:49 PM   Neuro/Psych negative psych ROS   GI/Hepatic GERD  Controlled and Medicated,Colostomy    Endo/Other    Renal/GU ARFRenal disease     Musculoskeletal   Abdominal   Peds  Hematology   Anesthesia Other Findings   Reproductive/Obstetrics                            Anesthesia Physical Anesthesia Plan  ASA: IV  Anesthesia Plan: MAC and General   Post-op Pain Management:    Induction: Intravenous  PONV Risk Score and Plan: 4 or greater and Ondansetron, Midazolam, Dexamethasone and Metaclopromide  Airway Management Planned: LMA  Additional Equipment:   Intra-op Plan:    Post-operative Plan: Extubation in OR  Informed Consent: I have reviewed the patients History and Physical, chart, labs and discussed the procedure including the risks, benefits and alternatives for the proposed anesthesia with the patient or authorized representative who has indicated his/her understanding and acceptance.     Dental advisory given  Plan Discussed with: Surgeon  Anesthesia Plan Comments:       Anesthesia Quick Evaluation

## 2020-05-17 NOTE — Transfer of Care (Signed)
Immediate Anesthesia Transfer of Care Note  Patient: Johnathan Baldwin  Procedure(s) Performed: CYSTOSCOPY WITH RETROGRADE PYELOGRAM/URETERAL STENT PLACEMENT (Bilateral )  Patient Location: PACU  Anesthesia Type:General  Level of Consciousness: awake, alert , oriented and sedated  Airway & Oxygen Therapy: Patient Spontanous Breathing  Post-op Assessment: Report given to RN  Post vital signs: Reviewed and stable  Last Vitals:  Vitals Value Taken Time  BP 143/96 05/17/20 1807  Temp 36.4 C 05/17/20 1806  Pulse 71 05/17/20 1814  Resp 13 05/17/20 1814  SpO2 99 % 05/17/20 1814  Vitals shown include unvalidated device data.  Last Pain:  Vitals:   05/17/20 1030  TempSrc:   PainSc: 0-No pain         Complications: No apparent anesthesia complications

## 2020-05-17 NOTE — Consult Note (Signed)
[facility] Renal Consultation Note  Requesting MD:  Indication for Consultation: Acute kidney injury, chronic kidney disease stage IV, maintenance of euvolemia, assessment of anemia, treatment of electrolyte abnormalities.  HPI:   Johnathan Baldwin is a 64 y.o. male.  History of renal calculi hypertension gastropathy reflux disease and chronic kidney disease stage IIIb/IV.  He is followed by Dr. Wolfgang Phoenix and was seen about 1 week ago started on lisinopril for hypertension.  He presented to the emergency room with decreased urination x2 days generalized weakness.  His baseline serum creatinine appears to be about 1.9 mg/dL as of December 28, 2019.  Previous creatinines have shown mild elevations up until May 2019 when a creatinine was measured at 1.2 mg/dL.  Home medications include chlorthalidone 25 mg daily Pepcid liquid 20 mg twice daily lisinopril 10 mg daily fentanyl 25 mcg every 3 days metoprolol 25 mg in the morning potassium 10 mEq twice daily and vitamin D 50,000 units once a week.  In the emergency room CT scan was performed showing a 4 mm distal left ureteral calculus near the UVJ without significant hydroureteronephrosis.  There is also left nephrolithiasis.  His creatinine was elevated 8.99.  Blood pressure 105 65 pulse 55 temperature 98.3 O2 sats 99% room air  Sodium 137 potassium 3.9 chloride 97 CO2 27 BUN 71 creatinine 8.99 glucose 107 albumin 4.4 hemoglobin 13.8  Medications Heparin subcu 5000 units every 8 hours, metoprolol 25 mg daily, Flomax 0.4 mg nightly  IV fluids D5 half normal saline 100 cc an hour  No urine output recorded input 1.1 L 05/17/2020   Creat  Date/Time Value Ref Range Status  12/28/2019 09:16 AM 1.92 (H) 0.70 - 1.25 mg/dL Final    Comment:    For patients >60 years of age, the reference limit for Creatinine is approximately 13% higher for people identified as African-American. .   11/30/2019 08:43 AM 1.70 (H) 0.70 - 1.25 mg/dL Final   Comment:    For patients >40 years of age, the reference limit for Creatinine is approximately 13% higher for people identified as African-American. Marland Kitchen   06/19/2019 10:35 AM 1.90 (H) 0.70 - 1.25 mg/dL Final    Comment:    For patients >81 years of age, the reference limit for Creatinine is approximately 13% higher for people identified as African-American. Marland Kitchen   05/29/2019 08:54 AM 1.61 (H) 0.70 - 1.25 mg/dL Final    Comment:    For patients >32 years of age, the reference limit for Creatinine is approximately 13% higher for people identified as African-American. Marland Kitchen   05/17/2018 09:03 AM 1.20 0.70 - 1.33 mg/dL Final    Comment:    For patients >66 years of age, the reference limit for Creatinine is approximately 13% higher for people identified as African-American. Marland Kitchen   04/25/2017 11:29 AM 1.29 0.70 - 1.33 mg/dL Final    Comment:      For patients > or = 62 years of age: The upper reference limit for Creatinine is approximately 13% higher for people identified as African-American.     12/08/2015 08:24 AM 1.12 0.70 - 1.33 mg/dL Final  16/96/7893 81:01 AM 1.22 0.50 - 1.35 mg/dL Final  75/09/2584 27:78 AM 1.11 0.50 - 1.35 mg/dL Final  24/23/5361 44:31 AM 1.14 0.50 - 1.35 mg/dL Final  54/00/8676 19:50 AM 1.17 0.50 - 1.35 mg/dL Final  93/26/7124 58:09 AM 1.15 0.50 - 1.35 mg/dL Final  98/33/8250 53:97 AM 1.08 0.50 - 1.35 mg/dL Final  67/34/1937 90:24  AM 1.12 0.50 - 1.35 mg/dL Final   Creatinine, Ser  Date/Time Value Ref Range Status  05/16/2020 02:41 PM 8.99 (H) 0.61 - 1.24 mg/dL Final  07/18/2012 06:59 AM 1.20 0.50 - 1.35 mg/dL Final  04/10/2012 11:14 PM 1.72 (H) 0.50 - 1.35 mg/dL Final  04/01/2012 10:19 PM 1.30 0.50 - 1.35 mg/dL Final  06/22/2010 01:25 AM 1.53 (H) 0.4 - 1.5 mg/dL Final     PMHx:   Past Medical History:  Diagnosis Date  . Chronic abdominal pain S/P ABD. SURG FROM INJURY  . GERD (gastroesophageal reflux disease)   . Heart murmur MILD- ASYMPTOMATIC  .  History of kidney stones   . Hypertension   . Left ureteral calculus   . Lung nodule 03/2012   CT scan  RIGHT MIDDLE LOBE- BENIGN    Past Surgical History:  Procedure Laterality Date  . ABDOMINAL SURGERY AND COLOSTOMY  2000   WORK INJURY FROM MACHINE  . URETEROSCOPY  July 2013   W/ stent, s/p removal for left stone    Family Hx:  Family History  Problem Relation Age of Onset  . Hypertension Mother   . Hypertension Brother   . Hypertension Sister   . Hypertension Brother     Social History:  reports that he has never smoked. He has never used smokeless tobacco. He reports that he does not drink alcohol or use drugs.  Allergies:  Allergies  Allergen Reactions  . Norvasc [Amlodipine Besylate] Swelling    Medications: Prior to Admission medications   Medication Sig Start Date End Date Taking? Authorizing Provider  chlorthalidone (HYGROTON) 25 MG tablet Take 25 mg by mouth in the morning. 05/05/20  Yes [provider]  famotidine (PEPCID) 40 MG/5ML suspension TAKE 2.5ML BY MOUTH TWICE DAILY. Patient taking differently: Take 20 mg by mouth 2 (two) times daily.  04/18/20  Yes Logan, Modena Nunnery, MD  fentaNYL (DURAGESIC) 25 MCG/HR Place 1 patch onto the skin every 3 (three) days. 04/18/20  Yes [place], Modena Nunnery, MD  lisinopril (ZESTRIL) 10 MG tablet Take 10 mg by mouth in the morning. 05/09/20  Yes [provider]  metoprolol succinate (TOPROL-XL) 25 MG 24 hr tablet Take 1 tablet (25 mg total) by mouth daily. Patient taking differently: Take 25 mg by mouth in the morning.  06/21/19  Yes Lanier, Modena Nunnery, MD  potassium citrate (UROCIT-K) 10 MEQ (1080 MG) SR tablet Take 10 mEq by mouth 2 (two) times daily. 03/07/20  Yes [provider]  Vitamin D, Ergocalciferol, (DRISDOL) 1.25 MG (50000 UNIT) CAPS capsule Take 50,000 Units by mouth once a week. 04/18/20  Yes [provider]     Labs:  Results for orders placed or performed during the hospital  encounter of 05/16/20 (from the past 48 hour(s))  Lipase, blood     Status: Abnormal   Collection Time: 05/16/20  2:41 PM  Result Value Ref Range   Lipase 58 (H) 11 - 51 U/L    Comment: Performed at [facility], [address], [place], [place] [address]  Comprehensive metabolic panel     Status: Abnormal   Collection Time: 05/16/20  2:41 PM  Result Value Ref Range   Sodium 137 135 - 145 mmol/L   Potassium 3.9 3.5 - 5.1 mmol/L   Chloride 97 (L) 98 - 111 mmol/L   CO2 27 22 - 32 mmol/L   Glucose, Bld 107 (H) 70 - 99 mg/dL    Comment: Glucose reference range applies only to samples  taken after fasting for at least 8 hours.   BUN 71 (H) 8 - 23 mg/dL   Creatinine, Ser 1.19 (H) 0.61 - 1.24 mg/dL   Calcium 9.3 8.9 - 14.7 mg/dL   Total Protein 8.3 (H) 6.5 - 8.1 g/dL   Albumin 4.4 3.5 - 5.0 g/dL   AST 24 15 - 41 U/L   ALT 17 0 - 44 U/L   Alkaline Phosphatase 74 38 - 126 U/L   Total Bilirubin 1.0 0.3 - 1.2 mg/dL   GFR calc non Af Amer 6 (L) >60 mL/min   GFR calc Af Amer 7 (L) >60 mL/min   Anion gap 13 5 - 15    Comment: Performed at [facility], [address], [place], [place] [address]  CBC     Status: None   Collection Time: 05/16/20  2:41 PM  Result Value Ref Range   WBC 8.0 4.0 - 10.5 K/uL   RBC 4.35 4.22 - 5.81 MIL/uL   Hemoglobin 13.8 13.0 - 17.0 g/dL   HCT 21.3 08.6 - 57.8 %   MCV 99.8 80.0 - 100.0 fL   MCH 31.7 26.0 - 34.0 pg   MCHC 31.8 30.0 - 36.0 g/dL   RDW 46.9 62.9 - 52.8 %   Platelets 369 150 - 400 K/uL   nRBC 0.0 0.0 - 0.2 %    Comment: Performed at [facility], [address], [place], [place] [address]  SARS Coronavirus 2 by RT PCR (hospital order, performed in [facility] hospital lab) Nasopharyngeal Nasopharyngeal Swab     Status: None   Collection Time: 05/16/20  5:51 PM   Specimen: Nasopharyngeal Swab  Result Value Ref Range   SARS Coronavirus 2 NEGATIVE NEGATIVE    Comment: (NOTE) SARS-CoV-2 target nucleic acids are NOT DETECTED. The SARS-CoV-2 RNA is  generally detectable in upper and lower respiratory specimens during the acute phase of infection. The lowest concentration of SARS-CoV-2 viral copies this assay can detect is 250 copies / mL. A negative result does not preclude SARS-CoV-2 infection and should not be used as the sole basis for treatment or other patient management decisions.  A negative result may occur with improper specimen collection / handling, submission of specimen other than nasopharyngeal swab, presence of viral mutation(s) within the areas targeted by this assay, and inadequate number of viral copies (<250 copies / mL). A negative result must be combined with clinical observations, patient history, and epidemiological information. Fact Sheet for Patients:   BoilerBrush.com.cy Fact Sheet for Healthcare Providers: https://pope.com/ This test is not yet approved or cleared  by the [place] FDA and has been authorized for detection and/or diagnosis of SARS-CoV-2 by FDA under an Emergency Use Authorization (EUA).  This EUA will remain in effect (meaning this test can be used) for the duration of the COVID-19 declaration under Section 564(b)(1) of the Act, 21 U.S.C. section 360bbb-3(b)(1), unless the authorization is terminated or revoked sooner. Performed at [facility], [address], [place], [place] [address]   Urinalysis, Routine w reflex microscopic     Status: Abnormal   Collection Time: 05/17/20 12:03 AM  Result Value Ref Range   Color, Urine YELLOW YELLOW   APPearance HAZY (A) CLEAR   Specific Gravity, Urine 1.011 1.005 - 1.030   pH 5.0 5.0 - 8.0   Glucose, UA NEGATIVE NEGATIVE mg/dL   Hgb urine dipstick MODERATE (A) NEGATIVE   Bilirubin Urine NEGATIVE NEGATIVE   Ketones, ur NEGATIVE NEGATIVE mg/dL   Protein, ur  30 (A) NEGATIVE mg/dL   Nitrite NEGATIVE NEGATIVE   Leukocytes,Ua MODERATE (A) NEGATIVE   RBC / HPF 0-5 0 - 5 RBC/hpf   WBC, UA >50 (H) 0  - 5 WBC/hpf   Bacteria, UA NONE SEEN NONE SEEN   Squamous Epithelial / LPF 0-5 0 - 5   Mucus PRESENT     Comment: Performed at [facility], [address] [place], [place] [address]  Sodium, urine, random     Status: None   Collection Time: 05/17/20 12:03 AM  Result Value Ref Range   Sodium, Ur 60 mmol/L    Comment: Performed at [facility], [address] [place], [place] [address]  Creatinine, urine, random     Status: None   Collection Time: 05/17/20 12:03 AM  Result Value Ref Range   Creatinine, Urine 210.33 mg/dL    Comment: Performed at [facility], [address] [place], [place] [address]     ROS:  General admits to weakness and fatigue dysgeusia and poor appetite Eyes no visual complaints Ears nose mouth throat no hearing loss epistaxis sore throat Cardiovascular no anginal chest pain no orthopnea no PND no ankle leg swelling Respiratory denies cough wheeze hemoptysis Abdominal system no abdominal pain nausea vomiting Urogenital diminished urine output Endocrine no diabetes thyroid or adrenal disease Dermatologic denies skin rash or itching Musculoskeletal no use nonsteroidal anti-inflammatory drugs Neurologic no stroke seizures numbness tingling pins-and-needles or weakness   Physical Exam: Vitals:   05/17/20 0014 05/17/20 0408  BP: 125/63 105/65  Pulse: (!) 55 (!) 55  Resp: 16 16  Temp: 98.7 F (37.1 C) 98.3 F (36.8 C)  SpO2: 100% 99%     General: Nondistressed alert and oriented HEENT: Oropharynx was clear with moist mucous membranes Eyes: Pupils round equal and reactive Neck: Supple no thyromegaly no JVP Heart: Regular rate and rhythm no murmurs rubs or gallops Lungs: Clear to auscultation no wheeze or rales Abdomen: Soft nontender no organosplenomegaly Extremities: No edema cyanosis or clubbing Skin: No skin lesions noted Neuro: Cranial nerves II through XII grossly intact no focal deficits  Assessment/Plan: 1.Renal-acute kidney injury in the  setting of stage IIIb/IV CKD.  Patient appears to have an obstructing calculus at the left distal ureter UVJ.  Urinalysis shows 30 mg/dL protein negative blood and greater than 50 WBCs per high-power field.  Will need send urine off for culture.  Urine sodium 60.  Patient recently started on ACE inhibitor lisinopril.  May have some mild volume depletion and of started on IV fluids D5 half-normal saline 100 cc an hour.  Would monitor urine output.  Avoid IV contrast or nephrotoxins.  Would avoid ACE inhibitors and ARB use. 2. Hypertension/volume  -appears to have some mild volume depletion agree with IV fluids we will continue to follow creatinine.  Continue D5W half-normal saline 100 cc an hour.  Have discontinued both lisinopril and chlorthalidone, patient continues on metoprolol this may need to be held if patient becomes hypotensive. 3.  Anemia this does not appear to be an issue at this time. 4.  Bones will continue to follow calcium and phosphorus patient was on 50,000 units a week vitamin D at home. 5.  Nephrolithiasis with what appears to be an obstructing calculus.  I would defer to urology.  Continue Flomax 0.4 mg nightly.   Garnetta Buddy  05/17/2020, 6:17 AM

## 2020-05-17 NOTE — Anesthesia Postprocedure Evaluation (Signed)
Anesthesia Post Note  Patient: Johnathan Baldwin  Procedure(s) Performed: CYSTOSCOPY WITH RETROGRADE PYELOGRAM/URETERAL STENT PLACEMENT (Bilateral )  Patient location during evaluation: PACU Anesthesia Type: MAC Level of consciousness: awake and alert and oriented Pain management: pain level controlled Vital Signs Assessment: post-procedure vital signs reviewed and stable Respiratory status: spontaneous breathing Cardiovascular status: blood pressure returned to baseline and stable Postop Assessment: no apparent nausea or vomiting Anesthetic complications: no     Last Vitals:  Vitals:   05/17/20 1806 05/17/20 1815  BP: (!) 143/96 (!) 144/86  Pulse: 85 71  Resp: 17 13  Temp: 36.4 C   SpO2: 100% 99%    Last Pain:  Vitals:   05/17/20 1826  TempSrc:   PainSc: 4                  Rubbie Goostree C Mariadelaluz [place]

## 2020-05-18 LAB — CBC WITH DIFFERENTIAL/PLATELET
Abs Immature Granulocytes: 0.01 10*3/uL (ref 0.00–0.07)
Basophils Absolute: 0 10*3/uL (ref 0.0–0.1)
Basophils Relative: 0 %
Eosinophils Absolute: 0 10*3/uL (ref 0.0–0.5)
Eosinophils Relative: 0 %
HCT: 38.1 % — ABNORMAL LOW (ref 39.0–52.0)
Hemoglobin: 12.1 g/dL — ABNORMAL LOW (ref 13.0–17.0)
Immature Granulocytes: 0 %
Lymphocytes Relative: 6 %
Lymphs Abs: 0.5 10*3/uL — ABNORMAL LOW (ref 0.7–4.0)
MCH: 31.1 pg (ref 26.0–34.0)
MCHC: 31.8 g/dL (ref 30.0–36.0)
MCV: 97.9 fL (ref 80.0–100.0)
Monocytes Absolute: 0.3 10*3/uL (ref 0.1–1.0)
Monocytes Relative: 4 %
Neutro Abs: 6.6 10*3/uL (ref 1.7–7.7)
Neutrophils Relative %: 90 %
Platelets: 319 10*3/uL (ref 150–400)
RBC: 3.89 MIL/uL — ABNORMAL LOW (ref 4.22–5.81)
RDW: 14 % (ref 11.5–15.5)
WBC: 7.5 10*3/uL (ref 4.0–10.5)
nRBC: 0 % (ref 0.0–0.2)

## 2020-05-18 LAB — RENAL FUNCTION PANEL
Albumin: 3.6 g/dL (ref 3.5–5.0)
Anion gap: 10 (ref 5–15)
BUN: 60 mg/dL — ABNORMAL HIGH (ref 8–23)
CO2: 22 mmol/L (ref 22–32)
Calcium: 9 mg/dL (ref 8.9–10.3)
Chloride: 104 mmol/L (ref 98–111)
Creatinine, Ser: 5.45 mg/dL — ABNORMAL HIGH (ref 0.61–1.24)
GFR calc Af Amer: 12 mL/min — ABNORMAL LOW (ref >60)
GFR calc non Af Amer: 10 mL/min — ABNORMAL LOW (ref >60)
Glucose, Bld: 142 mg/dL — ABNORMAL HIGH (ref 70–99)
Phosphorus: 3.3 mg/dL (ref 2.5–4.6)
Potassium: 4.1 mmol/L (ref 3.5–5.1)
Sodium: 136 mmol/L (ref 135–145)

## 2020-05-18 LAB — BASIC METABOLIC PANEL
Anion gap: 10 (ref 5–15)
BUN: 59 mg/dL — ABNORMAL HIGH (ref 8–23)
CO2: 23 mmol/L (ref 22–32)
Calcium: 9 mg/dL (ref 8.9–10.3)
Chloride: 104 mmol/L (ref 98–111)
Creatinine, Ser: 5.55 mg/dL — ABNORMAL HIGH (ref 0.61–1.24)
GFR calc Af Amer: 12 mL/min — ABNORMAL LOW (ref >60)
GFR calc non Af Amer: 10 mL/min — ABNORMAL LOW (ref >60)
Glucose, Bld: 143 mg/dL — ABNORMAL HIGH (ref 70–99)
Potassium: 4.1 mmol/L (ref 3.5–5.1)
Sodium: 137 mmol/L (ref 135–145)

## 2020-05-18 LAB — HIV ANTIBODY (ROUTINE TESTING W REFLEX): HIV Screen 4th Generation wRfx: NONREACTIVE

## 2020-05-18 LAB — MAGNESIUM: Magnesium: 2.4 mg/dL (ref 1.7–2.4)

## 2020-05-18 NOTE — Progress Notes (Addendum)
PROGRESS NOTE   Johnathan Baldwin  GTX:[id number] DOB: [birth year] DOA: 05/16/2020 PCP: Salley Scarlet, MD   Chief Complaint  Patient presents with  . Abdominal Pain    Brief Admission History:  64 y.o. male, with history of kidney stones, hypertension, GERD came to hospital with complaints of generalized weakness, decreased urination since Wednesday, nausea and vomiting.  Patient has history of hypertension and chronic kidney disease stage IIIb, he was recently seen by nephrology as outpatient and was started on lisinopril 1 week ago and chlorthalidone 3 weeks ago.  Today in the ED lab work showed creatinine 8.99, last creatinine was 1.92 as of 12/28/2019.  BUN is 71.  CT scan abdomen showed 4 mm distal left ureteral calculus near the UVJ without significant hydroureteronephrosis.  CT with left and right distal ureteral nephrolithiasis.  Urologist Dr.  Ronne Binning consulted for operative management.  Nephrology Dr. Hyman Hopes was consulted by ED provider.  Assessment & Plan:   Principal Problem:   Bilateral ureteral obstruction Active Problems:   HTN (hypertension), benign   GERD (gastroesophageal reflux disease)   AKI (acute kidney injury) (HCC)   1. Bilateral ureteral obstruction-s/p bilateral stent placement by Dr. Ronne Binning with urology.  He will need outpatient follow up with Dr. Ronne Binning for stone extraction in 2-3 weeks.    2. Essential hypertension-resume home medications and follow. 3. AKI-secondary to bilateral ureteral obstruction. Now s/p bilateral stent placement, renal function is improving.  appreciate nephrology consultation. 4. GERD-Protonix for GI protection.  DVT prophylaxis: Subcu heparin Code Status: Full Family Communication: wife telephone, left msg Disposition:   Status is: Inpatient  Remains inpatient appropriate because:Persistent severe electrolyte disturbances and IV treatments appropriate due to intensity of illness or inability to take PO  Dispo: The patient  is from: Home              Anticipated d/c is to: Home              Anticipated d/c date is: 1 day              Patient currently is not medically stable to d/c.  Consultants:   Nephrology  Urology  Procedures:     Antimicrobials:     Subjective: Patient says he is hungry.  He is urinating a lot now.  He wants to go home.   Objective: Vitals:   05/17/20 1830 05/17/20 1956 05/17/20 2230 05/18/20 0638  BP: (!) 143/78  140/85 133/79  Pulse: (!) 55  (!) 57 (!) 55  Resp: 14  16 16   Temp:   98 F (36.7 C) 98.2 F (36.8 C)  TempSrc:   Oral Oral  SpO2: 100% 100% 100% 100%  Weight:      Height:        Intake/Output Summary (Last 24 hours) at 05/18/2020 1346 Last data filed at 05/18/2020 0645 Gross per 24 hour  Intake 1627.2 ml  Output 1100 ml  Net 527.2 ml   Filed Weights   05/16/20 1345  Weight: 65.3 kg    Examination:  General exam: Appears calm and comfortable  Respiratory system: Clear to auscultation. Respiratory effort normal. Cardiovascular system: S1 & S2 heard, RRR. No JVD, murmurs, rubs, gallops or clicks. No pedal edema. Gastrointestinal system: Abdomen is nondistended, soft and nontender. No organomegaly or masses felt. Normal bowel sounds heard. Central nervous system: Alert and oriented. No focal neurological deficits. Extremities: Symmetric 5 x 5 power. Skin: No rashes, lesions or ulcers Psychiatry: Judgement and insight appear  normal. Mood & affect appropriate.   Data Reviewed: I have personally reviewed following labs and imaging studies  CBC: Recent Labs  Lab 05/16/20 1441 05/17/20 0849 05/18/20 0707  WBC 8.0 6.1 7.5  NEUTROABS  --   --  6.6  HGB 13.8 12.0* 12.1*  HCT 43.4 38.4* 38.1*  MCV 99.8 99.0 97.9  PLT 369 146* 319    Basic Metabolic Panel: Recent Labs  Lab 05/16/20 1441 05/17/20 0849 05/18/20 0707 05/18/20 1015  NA 137 135  --  137  K 3.9 4.0  --  4.1  CL 97* 100  --  104  CO2 27 22  --  23  GLUCOSE 107* 119*  --   143*  BUN 71* 69*  --  59*  CREATININE 8.99* 7.41*  --  5.55*  CALCIUM 9.3 8.8*  --  9.0  MG  --   --  2.4  --     GFR: Estimated Creatinine Clearance: 12.9 mL/min (A) (by C-G formula based on SCr of 5.55 mg/dL (H)).  Liver Function Tests: Recent Labs  Lab 05/16/20 1441 05/17/20 0849  AST 24 26  ALT 17 15  ALKPHOS 74 60  BILITOT 1.0 1.3*  PROT 8.3* 7.0  ALBUMIN 4.4 3.7    CBG: No results for input(s): GLUCAP in the last 168 hours.  Recent Results (from the past 240 hour(s))  SARS Coronavirus 2 by RT PCR (hospital order, performed in [facility] hospital lab) Nasopharyngeal Nasopharyngeal Swab     Status: None   Collection Time: 05/16/20  5:51 PM   Specimen: Nasopharyngeal Swab  Result Value Ref Range Status   SARS Coronavirus 2 NEGATIVE NEGATIVE Final    Comment: (NOTE) SARS-CoV-2 target nucleic acids are NOT DETECTED. The SARS-CoV-2 RNA is generally detectable in upper and lower respiratory specimens during the acute phase of infection. The lowest concentration of SARS-CoV-2 viral copies this assay can detect is 250 copies / mL. A negative result does not preclude SARS-CoV-2 infection and should not be used as the sole basis for treatment or other patient management decisions.  A negative result may occur with improper specimen collection / handling, submission of specimen other than nasopharyngeal swab, presence of viral mutation(s) within the areas targeted by this assay, and inadequate number of viral copies (<250 copies / mL). A negative result must be combined with clinical observations, patient history, and epidemiological information. Fact Sheet for Patients:   BoilerBrush.com.cy Fact Sheet for Healthcare Providers: https://pope.com/ This test is not yet approved or cleared  by the [place] FDA and has been authorized for detection and/or diagnosis of SARS-CoV-2 by FDA under an Emergency Use Authorization  (EUA).  This EUA will remain in effect (meaning this test can be used) for the duration of the COVID-19 declaration under Section 564(b)(1) of the Act, 21 U.S.C. section 360bbb-3(b)(1), unless the authorization is terminated or revoked sooner. Performed at [facility], [address], [place], [place] [address]   Surgical pcr screen     Status: Abnormal   Collection Time: 05/17/20  2:52 PM   Specimen: Nasal Mucosa; Nasal Swab  Result Value Ref Range Status   MRSA, PCR NEGATIVE NEGATIVE Final   Staphylococcus aureus POSITIVE (A) NEGATIVE Final    Comment: (NOTE) The Xpert SA Assay (FDA approved for NASAL specimens in patients 58 years of age and older), is one component of a comprehensive surveillance program. It is not intended to diagnose infection nor to guide or monitor treatment. Performed at [facility], [address], [place], [place] [address]      Radiology Studies: US RENAL  Result Date: 05/17/2020 CLINICAL DATA:  Acute kidney injury on admission. EXAM: RENAL / URINARY TRACT ULTRASOUND COMPLETE COMPARISON:  CT renal stone protocol 05/16/2020 Hg FINDINGS: Right Kidney: Renal measurements: 9.8 x 3.6 x 6.0 cm = volume: 172 mL . Echogenicity within normal limits. There are two renal cysts measuring up to 1.8 cm. No hydronephrosis. Left Kidney: Renal measurements: 10.4 x 5.0 x 4.1 cm = volume: 112 mL. Echogenicity within normal limits. There is one renal cyst measuring 1.6 cm. There are multiple shadowing renal calculi measuring up to 0.8 cm. No hydronephrosis. Bladder: Decompressed with Foley catheter in place. Other: None. IMPRESSION: 1.  Left renal calculi.  No hydronephrosis bilaterally. 2.  Bilateral renal cysts. Electronically Signed   By: Emmaline Kluver M.D.   On: 05/17/2020 11:01   DG C-Arm 1-60 Min-No Report  Result Date: 05/17/2020 Fluoroscopy was utilized by the requesting physician.  No radiographic interpretation.   CT Renal Stone Study  Result Date:  05/16/2020 CLINICAL DATA:  Left-sided abdominal pain and vomiting for 2 days. Nephrolithiasis. Acute renal failure. EXAM: CT ABDOMEN AND PELVIS WITHOUT CONTRAST TECHNIQUE: Multidetector CT imaging of the abdomen and pelvis was performed following the standard protocol without IV contrast. COMPARISON:  01/14/2016 from [facility] FINDINGS: Lower Chest: No acute findings. Hepatobiliary: No hepatic masses identified. Gallstones are seen, however there is no evidence of cholecystitis or biliary dilatation. Pancreas: Stable postop changes from distal pancreatectomy. No evidence of mass or inflammatory changes. Spleen: Surgically absent. Adrenals/Urinary Tract: A few small fluid attenuation cyst noted in the right kidney. Mild left renal parenchymal scarring again seen. Multiple calculi are seen in the lower pole of the left kidney measuring up to 7 mm. A 4 mm calculus is seen in the distal left ureter near the UVJ, however there is no evidence of acute hydroureteronephrosis. Mild diffuse bladder wall thickening is seen with hazy opacity in the perivesical fat, suspicious for cystitis. Stomach/Bowel: No evidence of obstruction, inflammatory process or abnormal fluid collections. Left abdominal surgical wall defect again seen, as well as partial gastrectomy and right lower quadrant colostomy. Vascular/Lymphatic: No pathologically enlarged lymph nodes. No abdominal aortic aneurysm. Aortic atherosclerosis noted. Reproductive:  No mass or other significant abnormality. Other:  None. Musculoskeletal:  No suspicious bone lesions identified. IMPRESSION: 1. 4 mm distal left ureteral calculus near the UVJ, without significant hydroureteronephrosis. 2. Left nephrolithiasis. 3. Mild diffuse bladder wall thickening with hazy opacity in perivesical fat, suspicious for cystitis. Suggest correlation with urinalysis. 4. Cholelithiasis. No radiographic evidence of cholecystitis. Aortic Atherosclerosis (ICD10-I70.0).  Electronically Signed   By: Danae Orleans M.D.   On: 05/16/2020 17:51   Scheduled Meds: . Chlorhexidine Gluconate Cloth  6 each Topical Daily  . heparin  5,000 Units Subcutaneous Q8H  . metoprolol succinate  25 mg Oral Daily  . tamsulosin  0.4 mg Oral QHS   Continuous Infusions: . dextrose 5 % and 0.45% NaCl 75 mL/hr at 05/17/20 1449     LOS: 2 days   Time spent: 21 minutes    Johnathan Footman Laural Benes, MD How to contact the [facility] Attending or Consulting provider 7A - 7P or covering provider during after hours 7P -7A, for this patient?  1. Check the care team in [facility] and look for a) attending/consulting [facility] provider listed and b) the [facility] team listed 2. Log into [email or web address] and use [place]'s universal password to access.  If you do not have the password, please contact the hospital operator. 3. Locate the [facility] provider you are looking for under [facility] and page to a number that you can be directly reached. 4. If you still have difficulty reaching the provider, please page the [facility] (Director on Call) for the Hospitalists listed on amion for assistance.  05/18/2020, 1:46 PM

## 2020-05-18 NOTE — Progress Notes (Signed)
[facility] ROUNDING NOTE   Subjective:   64 year old gentleman with history of renal calculi hypertension gastric reflux disease and chronic kidney disease stage IIIb.  Baseline serum creatinine appears to be about 1.9 mg/dL December 28, 2019.  Mild elevations noted up until May 2019 when his creatinine was measured at 1.2 mg/dL.  He presented to the emergency room with decreased urination.  In the emergency room CT scan showed a 4 mm distal left ureteral calculus with no significant hydroureteronephrosis lordosis.  But also left nephrolithiasis.  He was seen by Dr. Alyson Ingles for urology and appreciate assistance.  He underwent cystoscopy with placement of bilateral double-J stents 05/17/2020.  Blood pressure 133/79 pulse 55 temperature 98.2 O2 sats 90% room air  Urine output 800 cc 05/17/2020.  1.1 L 05/18/2020  Labs pending this morning.  Flomax 0.4 mg daily, Toprol-XL 25 mg daily  D5 half-normal saline 75 cc an hour  Objective:  Vital signs in last 24 hours:  Temp:  [97.6 F (36.4 C)-98.3 F (36.8 C)] 98.2 F (36.8 C) (05/30 0109) Pulse Rate:  [55-85] 55 (05/30 0638) Resp:  [13-17] 16 (05/30 0638) BP: (124-144)/(61-96) 133/79 (05/30 0638) SpO2:  [99 %-100 %] 100 % (05/30 3235)  Weight change:  Filed Weights   05/16/20 1345  Weight: 65.3 kg    Intake/Output: I/O last 3 completed shifts: In: 3024.7 [P.O.:240; I.V.:2384.7; Other:400] Out: 1900 [Urine:1900]   Intake/Output this shift:  No intake/output data recorded.  CVS- RRR no murmurs rubs or gallops RS- CTA no wheeze or rales ABD- BS present soft non-distended EXT- no edema   Basic Metabolic Panel: Recent Labs  Lab 05/16/20 1441 05/17/20 0849 05/18/20 0707  NA 137 135  --   K 3.9 4.0  --   CL 97* 100  --   CO2 27 22  --   GLUCOSE 107* 119*  --   BUN 71* 69*  --   CREATININE 8.99* 7.41*  --   CALCIUM 9.3 8.8*  --   MG  --   --  2.4    Liver Function Tests: Recent Labs  Lab 05/16/20 1441  05/17/20 0849  AST 24 26  ALT 17 15  ALKPHOS 74 60  BILITOT 1.0 1.3*  PROT 8.3* 7.0  ALBUMIN 4.4 3.7   Recent Labs  Lab 05/16/20 1441  LIPASE 58*   No results for input(s): AMMONIA in the last 168 hours.  CBC: Recent Labs  Lab 05/16/20 1441 05/17/20 0849 05/18/20 0707  WBC 8.0 6.1 7.5  NEUTROABS  --   --  6.6  HGB 13.8 12.0* 12.1*  HCT 43.4 38.4* 38.1*  MCV 99.8 99.0 97.9  PLT 369 146* 319    Cardiac Enzymes: No results for input(s): CKTOTAL, CKMB, CKMBINDEX, TROPONINI in the last 168 hours.  BNP: Invalid input(s): POCBNP  CBG: No results for input(s): GLUCAP in the last 168 hours.  Microbiology: Results for orders placed or performed during the hospital encounter of 05/16/20  SARS Coronavirus 2 by RT PCR (hospital order, performed in [facility] hospital lab) Nasopharyngeal Nasopharyngeal Swab     Status: None   Collection Time: 05/16/20  5:51 PM   Specimen: Nasopharyngeal Swab  Result Value Ref Range Status   SARS Coronavirus 2 NEGATIVE NEGATIVE Final    Comment: (NOTE) SARS-CoV-2 target nucleic acids are NOT DETECTED. The SARS-CoV-2 RNA is generally detectable in upper and lower respiratory specimens during the acute phase of infection. The lowest concentration of SARS-CoV-2 viral copies this  assay can detect is 250 copies / mL. A negative result does not preclude SARS-CoV-2 infection and should not be used as the sole basis for treatment or other patient management decisions.  A negative result may occur with improper specimen collection / handling, submission of specimen other than nasopharyngeal swab, presence of viral mutation(s) within the areas targeted by this assay, and inadequate number of viral copies (<250 copies / mL). A negative result must be combined with clinical observations, patient history, and epidemiological information. Fact Sheet for Patients:   BoilerBrush.com.cy Fact Sheet for Healthcare  Providers: https://pope.com/ This test is not yet approved or cleared  by the [place] FDA and has been authorized for detection and/or diagnosis of SARS-CoV-2 by FDA under an Emergency Use Authorization (EUA).  This EUA will remain in effect (meaning this test can be used) for the duration of the COVID-19 declaration under Section 564(b)(1) of the Act, 21 U.S.C. section 360bbb-3(b)(1), unless the authorization is terminated or revoked sooner. Performed at [facility], [address] [place], [place] [address]   Surgical pcr screen     Status: Abnormal   Collection Time: 05/17/20  2:52 PM   Specimen: Nasal Mucosa; Nasal Swab  Result Value Ref Range Status   MRSA, PCR NEGATIVE NEGATIVE Final   Staphylococcus aureus POSITIVE (A) NEGATIVE Final    Comment: (NOTE) The Xpert SA Assay (FDA approved for NASAL specimens in patients 49 years of age and older), is one component of a comprehensive surveillance program. It is not intended to diagnose infection nor to guide or monitor treatment. Performed at [facility], [address], [place], [place] [address]     Coagulation Studies: No results for input(s): LABPROT, INR in the last 72 hours.  Urinalysis: Recent Labs    05/17/20 0003  COLORURINE YELLOW  LABSPEC 1.011  PHURINE 5.0  GLUCOSEU NEGATIVE  HGBUR MODERATE*  BILIRUBINUR NEGATIVE  KETONESUR NEGATIVE  PROTEINUR 30*  NITRITE NEGATIVE  LEUKOCYTESUR MODERATE*      Imaging: US RENAL  Result Date: 05/17/2020 CLINICAL DATA:  Acute kidney injury on admission. EXAM: RENAL / URINARY TRACT ULTRASOUND COMPLETE COMPARISON:  CT renal stone protocol 05/16/2020 Hg FINDINGS: Right Kidney: Renal measurements: 9.8 x 3.6 x 6.0 cm = volume: 172 mL . Echogenicity within normal limits. There are two renal cysts measuring up to 1.8 cm. No hydronephrosis. Left Kidney: Renal measurements: 10.4 x 5.0 x 4.1 cm = volume: 112 mL. Echogenicity within normal limits. There  is one renal cyst measuring 1.6 cm. There are multiple shadowing renal calculi measuring up to 0.8 cm. No hydronephrosis. Bladder: Decompressed with Foley catheter in place. Other: None. IMPRESSION: 1.  Left renal calculi.  No hydronephrosis bilaterally. 2.  Bilateral renal cysts. Electronically Signed   By: Emmaline Kluver M.D.   On: 05/17/2020 11:01   DG C-Arm 1-60 Min-No Report  Result Date: 05/17/2020 Fluoroscopy was utilized by the requesting physician.  No radiographic interpretation.   CT Renal Stone Study  Result Date: 05/16/2020 CLINICAL DATA:  Left-sided abdominal pain and vomiting for 2 days. Nephrolithiasis. Acute renal failure. EXAM: CT ABDOMEN AND PELVIS WITHOUT CONTRAST TECHNIQUE: Multidetector CT imaging of the abdomen and pelvis was performed following the standard protocol without IV contrast. COMPARISON:  01/14/2016 from [facility] FINDINGS: Lower Chest: No acute findings. Hepatobiliary: No hepatic masses identified. Gallstones are seen, however there is no evidence of cholecystitis or biliary dilatation. Pancreas: Stable postop changes from distal pancreatectomy. No evidence of mass or inflammatory changes.  Spleen: Surgically absent. Adrenals/Urinary Tract: A few small fluid attenuation cyst noted in the right kidney. Mild left renal parenchymal scarring again seen. Multiple calculi are seen in the lower pole of the left kidney measuring up to 7 mm. A 4 mm calculus is seen in the distal left ureter near the UVJ, however there is no evidence of acute hydroureteronephrosis. Mild diffuse bladder wall thickening is seen with hazy opacity in the perivesical fat, suspicious for cystitis. Stomach/Bowel: No evidence of obstruction, inflammatory process or abnormal fluid collections. Left abdominal surgical wall defect again seen, as well as partial gastrectomy and right lower quadrant colostomy. Vascular/Lymphatic: No pathologically enlarged lymph nodes. No abdominal aortic  aneurysm. Aortic atherosclerosis noted. Reproductive:  No mass or other significant abnormality. Other:  None. Musculoskeletal:  No suspicious bone lesions identified. IMPRESSION: 1. 4 mm distal left ureteral calculus near the UVJ, without significant hydroureteronephrosis. 2. Left nephrolithiasis. 3. Mild diffuse bladder wall thickening with hazy opacity in perivesical fat, suspicious for cystitis. Suggest correlation with urinalysis. 4. Cholelithiasis. No radiographic evidence of cholecystitis. Aortic Atherosclerosis (ICD10-I70.0). Electronically Signed   By: Danae Orleans M.D.   On: 05/16/2020 17:51     Medications:   . dextrose 5 % and 0.45% NaCl 75 mL/hr at 05/17/20 1449   . Chlorhexidine Gluconate Cloth  6 each Topical Daily  . heparin  5,000 Units Subcutaneous Q8H  . metoprolol succinate  25 mg Oral Daily  . tamsulosin  0.4 mg Oral QHS   acetaminophen **OR** acetaminophen, ondansetron **OR** ondansetron (ZOFRAN) IV  Assessment/ Plan:  1.Renal-acute kidney injury in the setting of stage IIIb/IV CKD.  Patient appears to have an obstructing calculus at the left distal ureter UVJ.  Urinalysis shows 30 mg/dL protein negative blood and greater than 50 WBCs per high-power field.  Will need send urine off for culture.  Urine sodium 60.  Patient recently started on ACE inhibitor lisinopril.  May have some mild volume depletion and of started on IV fluids D5 half-normal saline 100 cc an hour.  Would monitor urine output.  Avoid IV contrast or nephrotoxins.  Would avoid ACE inhibitors and ARB use.  Labs pending 05/18/2020 2. Hypertension/volume  -appears to have some mild volume depletion agree with IV fluids we will continue to follow creatinine.  Continue D5W half-normal saline 75 cc an hour.  Have discontinued both lisinopril and chlorthalidone, patient continues on metoprolol this may need to be held if patient becomes hypotensive. 3.  Anemia this does not appear to be an issue at this time. 4.   Bones will continue to follow calcium and phosphorus patient was on 50,000 units a week vitamin D at home. 5.  Nephrolithiasis with what appears to be an obstructing calculus.  Appreciate assistance from urology.  Status post placement double-J stents 05/17/2020   LOS: 2 Garnetta Buddy @TODAY @8 :54 AM

## 2020-05-19 LAB — CBC
HCT: 35.5 % — ABNORMAL LOW (ref 39.0–52.0)
Hemoglobin: 11.4 g/dL — ABNORMAL LOW (ref 13.0–17.0)
MCH: 31.4 pg (ref 26.0–34.0)
MCHC: 32.1 g/dL (ref 30.0–36.0)
MCV: 97.8 fL (ref 80.0–100.0)
Platelets: 299 10*3/uL (ref 150–400)
RBC: 3.63 MIL/uL — ABNORMAL LOW (ref 4.22–5.81)
RDW: 13.9 % (ref 11.5–15.5)
WBC: 5.9 10*3/uL (ref 4.0–10.5)
nRBC: 0 % (ref 0.0–0.2)

## 2020-05-19 LAB — BASIC METABOLIC PANEL
Anion gap: 8 (ref 5–15)
BUN: 46 mg/dL — ABNORMAL HIGH (ref 8–23)
CO2: 24 mmol/L (ref 22–32)
Calcium: 8.7 mg/dL — ABNORMAL LOW (ref 8.9–10.3)
Chloride: 106 mmol/L (ref 98–111)
Creatinine, Ser: 3.42 mg/dL — ABNORMAL HIGH (ref 0.61–1.24)
GFR calc Af Amer: 21 mL/min — ABNORMAL LOW (ref >60)
GFR calc non Af Amer: 18 mL/min — ABNORMAL LOW (ref >60)
Glucose, Bld: 119 mg/dL — ABNORMAL HIGH (ref 70–99)
Potassium: 3.6 mmol/L (ref 3.5–5.1)
Sodium: 138 mmol/L (ref 135–145)

## 2020-05-19 MED ORDER — TAMSULOSIN HCL 0.4 MG PO CAPS: 0.4000 mg | ORAL_CAPSULE | Freq: Every day | ORAL | 0 refills | Status: DC

## 2020-05-19 MED ORDER — METOPROLOL SUCCINATE ER 25 MG PO TB24
25.0000 mg | ORAL_TABLET | Freq: Every morning | ORAL | Status: DC
Start: 2020-05-19 — End: 2020-06-02

## 2020-05-19 MED ORDER — FAMOTIDINE 40 MG/5ML PO SUSR: 20.0000 mg | Freq: Every day | ORAL | 3 refills | Status: DC | PRN

## 2020-05-19 NOTE — Discharge Summary (Signed)
Physician Discharge Summary  Johnathan Baldwin XFG:[id number] DOB: 30-Apr-1956 DOA: 05/16/2020  PCP: Johnathan Scarlet, MD Nephrologist: Dr. Ronalee Belts Urologist: Dr. Ronne Binning  Admit date: 05/16/2020 Discharge date: 05/19/2020  Admitted From:  Home  Disposition:  Home   Recommendations for Outpatient Follow-up:  1. Follow up with nephrology in 2 weeks 2. Follow up with Dr. Ronne Binning in 2-3 weeks for stone extraction  3. Follow up with PCP as scheduled.  4. Please obtain BMP 1-2 weeks for recheck of renal function.   Discharge Condition: STABLE  CODE STATUS: FULL    Brief Hospitalization Summary: Please see all hospital notes, images, labs for full details of the hospitalization. Johnathan Baldwin  is a 64 y.o. male, with history of kidney stones, hypertension, GERD came to hospital with complaints of generalized weakness, decreased urination since Wednesday, nausea and vomiting.  Patient has history of hypertension and chronic kidney disease stage IIIb, he was recently seen by nephrology as outpatient and was started on lisinopril 1 week ago and chlorthalidone 3 weeks ago.  Today in the ED lab work showed creatinine 8.99, last creatinine was 1.92 as of 12/28/2019.  BUN is 71.  CT scan abdomen showed 4 mm distal left ureteral calculus near the UVJ without significant hydroureteronephrosis.  Shows left nephrolithiasis. Nephrology Dr. Hyman Hopes was consulted by ED provider.  Patient denies chest pain or shortness of breath.  He only made little bit urine this morning.  Has not had urination since Wednesday. Denies diarrhea constipation.  Has history of chronic abdominal pain.  Denies dysuria or fever.  63 y.o.male,with history of kidney stones, hypertension, GERD came to hospital with complaints of generalized weakness, decreased urination since Wednesday, nausea and vomiting. Patient has history of hypertension and chronic kidney disease stage IIIb,he was recently seen by nephrology as outpatient and was  started on lisinopril 1 week ago and chlorthalidone 3 weeks ago. Today in the ED lab work showed creatinine 8.99, last creatinine was 1.92 as of 12/28/2019. BUN is 71. CT scan abdomen showed 4 mm distal left ureteral calculus near the UVJ without significant hydroureteronephrosis.  CT with left and right distal ureteral nephrolithiasis.  Urologist Dr.  Ronne Binning consulted for operative management.  Nephrology Dr. Hyman Hopes was consulted by ED provider.  Assessment & Plan:   Principal Problem:   Bilateral ureteral obstruction Active Problems:   HTN (hypertension), benign   GERD (gastroesophageal reflux disease)   AKI (acute kidney injury)   1. Bilateral ureteral obstruction-s/p bilateral stent placement by Dr. Ronne Binning with urology.  He will need outpatient follow up with Dr. Ronne Binning for stone extraction in 2-3 weeks.   Foley removed prior to DC and void trial prior to DC was ordered.   2. Essential hypertension-Hold lisinopril and chlorthalidone until follow up with nephrology, resume metoprolol.  3. AKI-secondary to bilateral ureteral obstruction.  Now s/p bilateral stent placement, renal function is improving.  appreciate nephrology consultation. 4. GERD-Protonix for GI protection.  DVT prophylaxis: Subcu heparin Code Status: Full Family Communication: wife telephone, left msg Disposition: Home with wife   Discharge Diagnoses:  Principal Problem:   Bilateral ureteral obstruction Active Problems:   HTN (hypertension), benign   GERD (gastroesophageal reflux disease)   AKI (acute kidney injury) (HCC)   Discharge Instructions:  Allergies as of 05/19/2020      Reactions   Norvasc [amlodipine Besylate] Swelling      Medication List    STOP taking these medications   chlorthalidone 25 MG tablet Commonly known as: HYGROTON  fentaNYL 25 MCG/HR Commonly known as: DURAGESIC   lisinopril 10 MG tablet Commonly known as: ZESTRIL   potassium citrate 10 MEQ (1080 MG) SR  tablet Commonly known as: UROCIT-K     TAKE these medications   famotidine 40 MG/5ML suspension Commonly known as: PEPCID Take 2.5 mLs (20 mg total) by mouth daily as needed for heartburn or indigestion. What changed: See the new instructions.   metoprolol succinate 25 MG 24 hr tablet Commonly known as: TOPROL-XL Take 1 tablet (25 mg total) by mouth in the morning.   tamsulosin 0.4 MG Caps capsule Commonly known as: FLOMAX Take 1 capsule (0.4 mg total) by mouth at bedtime.   Vitamin D (Ergocalciferol) 1.25 MG (50000 UNIT) Caps capsule Commonly known as: DRISDOL Take 50,000 Units by mouth once a week.      Follow-up Information    McKenzie, Mardene CelestePatrick L, MD. Schedule an appointment as soon as possible for a visit in 2 week(s).   Specialty: Urology Why: Hospital Follow Up  Contact information: [address] [place] [address] [phone]        Maxie BarbBhandari, Dron Prasad, MD. Schedule an appointment as soon as possible for a visit in 2 week(s).   Specialties: Nephrology, Internal Medicine Why: Hospital Follow Up  Contact information: [address] [place] [address] [phone]        Johnathan Scarleturham, Kawanta F, MD. Schedule an appointment as soon as possible for a visit in 1 week(s).   Specialty: Family Medicine Contact information: [address] [place] [address] [phone]          Allergies  Allergen Reactions  . Norvasc [Amlodipine Besylate] Swelling   Allergies as of 05/19/2020      Reactions   Norvasc [amlodipine Besylate] Swelling      Medication List    STOP taking these medications   chlorthalidone 25 MG tablet Commonly known as: HYGROTON   fentaNYL 25 MCG/HR Commonly known as: DURAGESIC   lisinopril 10 MG tablet Commonly known as: ZESTRIL   potassium citrate 10 MEQ (1080 MG) SR tablet Commonly known as: UROCIT-K     TAKE these medications   famotidine 40 MG/5ML suspension Commonly known as: PEPCID Take 2.5 mLs (20 mg  total) by mouth daily as needed for heartburn or indigestion. What changed: See the new instructions.   metoprolol succinate 25 MG 24 hr tablet Commonly known as: TOPROL-XL Take 1 tablet (25 mg total) by mouth in the morning.   tamsulosin 0.4 MG Caps capsule Commonly known as: FLOMAX Take 1 capsule (0.4 mg total) by mouth at bedtime.   Vitamin D (Ergocalciferol) 1.25 MG (50000 UNIT) Caps capsule Commonly known as: DRISDOL Take 50,000 Units by mouth once a week.      Procedures/Studies: US RENAL  Result Date: 05/17/2020 CLINICAL DATA:  Acute kidney injury on admission. EXAM: RENAL / URINARY TRACT ULTRASOUND COMPLETE COMPARISON:  CT renal stone protocol 05/16/2020 Hg FINDINGS: Right Kidney: Renal measurements: 9.8 x 3.6 x 6.0 cm = volume: 172 mL . Echogenicity within normal limits. There are two renal cysts measuring up to 1.8 cm. No hydronephrosis. Left Kidney: Renal measurements: 10.4 x 5.0 x 4.1 cm = volume: 112 mL. Echogenicity within normal limits. There is one renal cyst measuring 1.6 cm. There are multiple shadowing renal calculi measuring up to 0.8 cm. No hydronephrosis. Bladder: Decompressed with Foley catheter in place. Other: None. IMPRESSION: 1.  Left renal calculi.  No hydronephrosis bilaterally. 2.  Bilateral  renal cysts. Electronically Signed   By: Emmaline Kluver M.D.   On: 05/17/2020 11:01   DG C-Arm 1-60 Min-No Report  Result Date: 05/17/2020 Fluoroscopy was utilized by the requesting physician.  No radiographic interpretation.   CT Renal Stone Study  Result Date: 05/16/2020 CLINICAL DATA:  Left-sided abdominal pain and vomiting for 2 days. Nephrolithiasis. Acute renal failure. EXAM: CT ABDOMEN AND PELVIS WITHOUT CONTRAST TECHNIQUE: Multidetector CT imaging of the abdomen and pelvis was performed following the standard protocol without IV contrast. COMPARISON:  01/14/2016 from [facility] FINDINGS: Lower Chest: No acute findings. Hepatobiliary: No  hepatic masses identified. Gallstones are seen, however there is no evidence of cholecystitis or biliary dilatation. Pancreas: Stable postop changes from distal pancreatectomy. No evidence of mass or inflammatory changes. Spleen: Surgically absent. Adrenals/Urinary Tract: A few small fluid attenuation cyst noted in the right kidney. Mild left renal parenchymal scarring again seen. Multiple calculi are seen in the lower pole of the left kidney measuring up to 7 mm. A 4 mm calculus is seen in the distal left ureter near the UVJ, however there is no evidence of acute hydroureteronephrosis. Mild diffuse bladder wall thickening is seen with hazy opacity in the perivesical fat, suspicious for cystitis. Stomach/Bowel: No evidence of obstruction, inflammatory process or abnormal fluid collections. Left abdominal surgical wall defect again seen, as well as partial gastrectomy and right lower quadrant colostomy. Vascular/Lymphatic: No pathologically enlarged lymph nodes. No abdominal aortic aneurysm. Aortic atherosclerosis noted. Reproductive:  No mass or other significant abnormality. Other:  None. Musculoskeletal:  No suspicious bone lesions identified. IMPRESSION: 1. 4 mm distal left ureteral calculus near the UVJ, without significant hydroureteronephrosis. 2. Left nephrolithiasis. 3. Mild diffuse bladder wall thickening with hazy opacity in perivesical fat, suspicious for cystitis. Suggest correlation with urinalysis. 4. Cholelithiasis. No radiographic evidence of cholecystitis. Aortic Atherosclerosis (ICD10-I70.0). Electronically Signed   By: Danae Orleans M.D.   On: 05/16/2020 17:51      Subjective: Pt reports that he feels much better, he is eating and drinking well.  He would really like to go home.   Discharge Exam: Vitals:   05/19/20 0600 05/19/20 1034  BP: 136/74   Pulse: (!) 49 60  Resp: 16   Temp: 98 F (36.7 C)   SpO2: 100%    Vitals:   05/18/20 2011 05/18/20 2105 05/19/20 0600 05/19/20 1034   BP:  (!) 158/90 136/74   Pulse:  (!) 57 (!) 49 60  Resp:  18 16   Temp:  99.3 F (37.4 C) 98 F (36.7 C)   TempSrc:  Oral Oral   SpO2: 98% 100% 100%   Weight:      Height:       General: Pt is alert, awake, not in acute distress Cardiovascular: RRR, S1/S2 +, no rubs, no gallops Respiratory: CTA bilaterally, no wheezing, no rhonchi Abdominal: Soft, NT, ND, bowel sounds + Extremities: no edema, no cyanosis GU: Foley in place.     The results of significant diagnostics from this hospitalization (including imaging, microbiology, ancillary and laboratory) are listed below for reference.     Microbiology: Recent Results (from the past 240 hour(s))  SARS Coronavirus 2 by RT PCR (hospital order, performed in [facility] hospital lab) Nasopharyngeal Nasopharyngeal Swab     Status: None   Collection Time: 05/16/20  5:51 PM   Specimen: Nasopharyngeal Swab  Result Value Ref Range Status   SARS Coronavirus 2 NEGATIVE NEGATIVE Final    Comment: (NOTE) SARS-CoV-2 target  nucleic acids are NOT DETECTED. The SARS-CoV-2 RNA is generally detectable in upper and lower respiratory specimens during the acute phase of infection. The lowest concentration of SARS-CoV-2 viral copies this assay can detect is 250 copies / mL. A negative result does not preclude SARS-CoV-2 infection and should not be used as the sole basis for treatment or other patient management decisions.  A negative result may occur with improper specimen collection / handling, submission of specimen other than nasopharyngeal swab, presence of viral mutation(s) within the areas targeted by this assay, and inadequate number of viral copies (<250 copies / mL). A negative result must be combined with clinical observations, patient history, and epidemiological information. Fact Sheet for Patients:   StrictlyIdeas.no Fact Sheet for Healthcare Providers: BankingDealers.co.za This test is  not yet approved or cleared  by the [place] FDA and has been authorized for detection and/or diagnosis of SARS-CoV-2 by FDA under an Emergency Use Authorization (EUA).  This EUA will remain in effect (meaning this test can be used) for the duration of the COVID-19 declaration under Section 564(b)(1) of the Act, 21 U.S.C. section 360bbb-3(b)(1), unless the authorization is terminated or revoked sooner. Performed at [facility], [address] [place], [place] [address]   Surgical pcr screen     Status: Abnormal   Collection Time: 05/17/20  2:52 PM   Specimen: Nasal Mucosa; Nasal Swab  Result Value Ref Range Status   MRSA, PCR NEGATIVE NEGATIVE Final   Staphylococcus aureus POSITIVE (A) NEGATIVE Final    Comment: (NOTE) The Xpert SA Assay (FDA approved for NASAL specimens in patients 61 years of age and older), is one component of a comprehensive surveillance program. It is not intended to diagnose infection nor to guide or monitor treatment. Performed at [facility], [address] [place], [place] [address]      Labs: BNP (last 3 results) No results for input(s): BNP in the last 8760 hours. Basic Metabolic Panel: Recent Labs  Lab 05/16/20 1441 05/17/20 0849 05/18/20 0707 05/18/20 1015 05/19/20 0545  NA 137 135 136 137 138  K 3.9 4.0 4.1 4.1 3.6  CL 97* 100 104 104 106  CO2 27 22 22 23 24   GLUCOSE 107* 119* 142* 143* 119*  BUN 71* 69* 60* 59* 46*  CREATININE 8.99* 7.41* 5.45* 5.55* 3.42*  CALCIUM 9.3 8.8* 9.0 9.0 8.7*  MG  --   --  2.4  --   --   PHOS  --   --  3.3  --   --    Liver Function Tests: Recent Labs  Lab 05/16/20 1441 05/17/20 0849 05/18/20 0707  AST 24 26  --   ALT 17 15  --   ALKPHOS 74 60  --   BILITOT 1.0 1.3*  --   PROT 8.3* 7.0  --   ALBUMIN 4.4 3.7 3.6   Recent Labs  Lab 05/16/20 1441  LIPASE 58*   No results for input(s): AMMONIA in the last 168 hours. CBC: Recent Labs  Lab 05/16/20 1441 05/17/20 0849 05/18/20 0707  05/19/20 0545  WBC 8.0 6.1 7.5 5.9  NEUTROABS  --   --  6.6  --   HGB 13.8 12.0* 12.1* 11.4*  HCT 43.4 38.4* 38.1* 35.5*  MCV 99.8 99.0 97.9 97.8  PLT 369 146* 319 299   Cardiac Enzymes: No results for input(s): CKTOTAL, CKMB, CKMBINDEX, TROPONINI in the last 168 hours. BNP: Invalid input(s): POCBNP CBG: No results for input(s): GLUCAP in the last  168 hours. D-Dimer No results for input(s): DDIMER in the last 72 hours. Hgb A1c No results for input(s): HGBA1C in the last 72 hours. Lipid Profile No results for input(s): CHOL, HDL, LDLCALC, TRIG, CHOLHDL, LDLDIRECT in the last 72 hours. Thyroid function studies No results for input(s): TSH, T4TOTAL, T3FREE, THYROIDAB in the last 72 hours.  Invalid input(s): FREET3 Anemia work up No results for input(s): VITAMINB12, FOLATE, FERRITIN, TIBC, IRON, RETICCTPCT in the last 72 hours. Urinalysis    Component Value Date/Time   COLORURINE YELLOW 05/17/2020 0003   APPEARANCEUR HAZY (A) 05/17/2020 0003   LABSPEC 1.011 05/17/2020 0003   PHURINE 5.0 05/17/2020 0003   GLUCOSEU NEGATIVE 05/17/2020 0003   HGBUR MODERATE (A) 05/17/2020 0003   BILIRUBINUR NEGATIVE 05/17/2020 0003   BILIRUBINUR negative 04/10/2012 1357   KETONESUR NEGATIVE 05/17/2020 0003   PROTEINUR 30 (A) 05/17/2020 0003   UROBILINOGEN 0.2 04/10/2012 2345   NITRITE NEGATIVE 05/17/2020 0003   LEUKOCYTESUR MODERATE (A) 05/17/2020 0003   Sepsis Labs Invalid input(s): PROCALCITONIN,  WBC,  LACTICIDVEN Microbiology Recent Results (from the past 240 hour(s))  SARS Coronavirus 2 by RT PCR (hospital order, performed in [facility] hospital lab) Nasopharyngeal Nasopharyngeal Swab     Status: None   Collection Time: 05/16/20  5:51 PM   Specimen: Nasopharyngeal Swab  Result Value Ref Range Status   SARS Coronavirus 2 NEGATIVE NEGATIVE Final    Comment: (NOTE) SARS-CoV-2 target nucleic acids are NOT DETECTED. The SARS-CoV-2 RNA is generally detectable in upper and  lower respiratory specimens during the acute phase of infection. The lowest concentration of SARS-CoV-2 viral copies this assay can detect is 250 copies / mL. A negative result does not preclude SARS-CoV-2 infection and should not be used as the sole basis for treatment or other patient management decisions.  A negative result may occur with improper specimen collection / handling, submission of specimen other than nasopharyngeal swab, presence of viral mutation(s) within the areas targeted by this assay, and inadequate number of viral copies (<250 copies / mL). A negative result must be combined with clinical observations, patient history, and epidemiological information. Fact Sheet for Patients:   BoilerBrush.com.cy Fact Sheet for Healthcare Providers: https://pope.com/ This test is not yet approved or cleared  by the [place] FDA and has been authorized for detection and/or diagnosis of SARS-CoV-2 by FDA under an Emergency Use Authorization (EUA).  This EUA will remain in effect (meaning this test can be used) for the duration of the COVID-19 declaration under Section 564(b)(1) of the Act, 21 U.S.C. section 360bbb-3(b)(1), unless the authorization is terminated or revoked sooner. Performed at [facility], [address], [place], [place] [address]   Surgical pcr screen     Status: Abnormal   Collection Time: 05/17/20  2:52 PM   Specimen: Nasal Mucosa; Nasal Swab  Result Value Ref Range Status   MRSA, PCR NEGATIVE NEGATIVE Final   Staphylococcus aureus POSITIVE (A) NEGATIVE Final    Comment: (NOTE) The Xpert SA Assay (FDA approved for NASAL specimens in patients 52 years of age and older), is one component of a comprehensive surveillance program. It is not intended to diagnose infection nor to guide or monitor treatment. Performed at [facility], [address], [place], [place] [address]    Time coordinating discharge: 32  minutes   SIGNED:  Standley Dakins, MD  [facility] 05/19/2020, 2:22 PM How to contact the [facility] Attending or Consulting provider 7A - 7P or covering provider during after hours 7P -  7A, for this patient?  1. Check the care team in [facility] and look for a) attending/consulting [facility] provider listed and b) the [facility] team listed 2. Log into [email or web address] and use [facility] universal password to access. If you do not have the password, please contact the hospital operator. 3. Locate the [facility] provider you are looking for under [facility] and page to a number that you can be directly reached. 4. If you still have difficulty reaching the provider, please page the [facility] (Director on Call) for the Hospitalists listed on amion for assistance.

## 2020-05-19 NOTE — Progress Notes (Signed)
[facility] ROUNDING NOTE   Background:  64 year old gentleman with history of renal calculi hypertension gastric reflux disease and chronic kidney disease stage IIIb.  Baseline serum creatinine appears to be about 1.9 mg/dL December 28, 2019.  Mild elevations noted up until May 2019 when his creatinine was measured at 1.2 mg/dL.  He presented to the emergency room with decreased urination.  In the emergency room CT scan showed a 4 mm distal left ureteral calculus with no significant hydroureteronephrosis.  But also left nephrolithiasis.  He was seen by Dr. Ronne Binning for urology and appreciate assistance.  He underwent cystoscopy with placement of bilateral double-J stents 05/17/2020.  Subjective:   Seen in room this AM; feeling fine.   I/Os 240 / 500 UOP  Objective:  Vital signs in last 24 hours:  Temp:  [98 F (36.7 C)-99.3 F (37.4 C)] 98 F (36.7 C) (05/31 0600) Pulse Rate:  [49-57] 49 (05/31 0600) Resp:  [16-18] 16 (05/31 0600) BP: (136-158)/(74-90) 136/74 (05/31 0600) SpO2:  [98 %-100 %] 100 % (05/31 0600)  Weight change:  Filed Weights   05/16/20 1345  Weight: 65.3 kg    Intake/Output: I/O last 3 completed shifts: In: 240 [P.O.:240] Out: 1900 [Urine:1700; Stool:200]   Intake/Output this shift:  No intake/output data recorded.  CVS- RRR no murmurs rubs or gallops RS- CTA no wheeze or rales ABD- BS present soft non-distended EXT- no edema  GU - foley in draining yellow urine with some sediment in the tubing.   Basic Metabolic Panel: Recent Labs  Lab 05/16/20 1441 05/16/20 1441 05/17/20 0849 05/17/20 0849 05/18/20 0707 05/18/20 1015 05/19/20 0545  NA 137  --  135  --  136 137 138  K 3.9  --  4.0  --  4.1 4.1 3.6  CL 97*  --  100  --  104 104 106  CO2 27  --  22  --  22 23 24   GLUCOSE 107*  --  119*  --  142* 143* 119*  BUN 71*  --  69*  --  60* 59* 46*  CREATININE 8.99*  --  7.41*  --  5.45* 5.55* 3.42*  CALCIUM 9.3   < > 8.8*   < > 9.0 9.0  8.7*  MG  --   --   --   --  2.4  --   --   PHOS  --   --   --   --  3.3  --   --    < > = values in this interval not displayed.    Liver Function Tests: Recent Labs  Lab 05/16/20 1441 05/17/20 0849 05/18/20 0707  AST 24 26  --   ALT 17 15  --   ALKPHOS 74 60  --   BILITOT 1.0 1.3*  --   PROT 8.3* 7.0  --   ALBUMIN 4.4 3.7 3.6   Recent Labs  Lab 05/16/20 1441  LIPASE 58*   No results for input(s): AMMONIA in the last 168 hours.  CBC: Recent Labs  Lab 05/16/20 1441 05/17/20 0849 05/18/20 0707 05/19/20 0545  WBC 8.0 6.1 7.5 5.9  NEUTROABS  --   --  6.6  --   HGB 13.8 12.0* 12.1* 11.4*  HCT 43.4 38.4* 38.1* 35.5*  MCV 99.8 99.0 97.9 97.8  PLT 369 146* 319 299    Cardiac Enzymes: No results for input(s): CKTOTAL, CKMB, CKMBINDEX, TROPONINI in the last 168 hours.  BNP: Invalid input(s): POCBNP  CBG: No results for input(s): GLUCAP in the last 168 hours.  Microbiology: Results for orders placed or performed during the hospital encounter of 05/16/20  SARS Coronavirus 2 by RT PCR (hospital order, performed in [facility] hospital lab) Nasopharyngeal Nasopharyngeal Swab     Status: None   Collection Time: 05/16/20  5:51 PM   Specimen: Nasopharyngeal Swab  Result Value Ref Range Status   SARS Coronavirus 2 NEGATIVE NEGATIVE Final    Comment: (NOTE) SARS-CoV-2 target nucleic acids are NOT DETECTED. The SARS-CoV-2 RNA is generally detectable in upper and lower respiratory specimens during the acute phase of infection. The lowest concentration of SARS-CoV-2 viral copies this assay can detect is 250 copies / mL. A negative result does not preclude SARS-CoV-2 infection and should not be used as the sole basis for treatment or other patient management decisions.  A negative result may occur with improper specimen collection / handling, submission of specimen other than nasopharyngeal swab, presence of viral mutation(s) within the areas targeted by this assay, and  inadequate number of viral copies (<250 copies / mL). A negative result must be combined with clinical observations, patient history, and epidemiological information. Fact Sheet for Patients:   StrictlyIdeas.no Fact Sheet for Healthcare Providers: BankingDealers.co.za This test is not yet approved or cleared  by the [place] FDA and has been authorized for detection and/or diagnosis of SARS-CoV-2 by FDA under an Emergency Use Authorization (EUA).  This EUA will remain in effect (meaning this test can be used) for the duration of the COVID-19 declaration under Section 564(b)(1) of the Act, 21 U.S.C. section 360bbb-3(b)(1), unless the authorization is terminated or revoked sooner. Performed at [facility], [address], [place], [place] [address]   Surgical pcr screen     Status: Abnormal   Collection Time: 05/17/20  2:52 PM   Specimen: Nasal Mucosa; Nasal Swab  Result Value Ref Range Status   MRSA, PCR NEGATIVE NEGATIVE Final   Staphylococcus aureus POSITIVE (A) NEGATIVE Final    Comment: (NOTE) The Xpert SA Assay (FDA approved for NASAL specimens in patients 2 years of age and older), is one component of a comprehensive surveillance program. It is not intended to diagnose infection nor to guide or monitor treatment. Performed at [facility], [address] [place], [place] [address]     Coagulation Studies: No results for input(s): LABPROT, INR in the last 72 hours.  Urinalysis: Recent Labs    05/17/20 0003  COLORURINE YELLOW  LABSPEC 1.011  PHURINE 5.0  GLUCOSEU NEGATIVE  HGBUR MODERATE*  BILIRUBINUR NEGATIVE  KETONESUR NEGATIVE  PROTEINUR 30*  NITRITE NEGATIVE  LEUKOCYTESUR MODERATE*      Imaging: US RENAL  Result Date: 05/17/2020 CLINICAL DATA:  Acute kidney injury on admission. EXAM: RENAL / URINARY TRACT ULTRASOUND COMPLETE COMPARISON:  CT renal stone protocol 05/16/2020 Hg FINDINGS: Right Kidney: Renal  measurements: 9.8 x 3.6 x 6.0 cm = volume: 172 mL . Echogenicity within normal limits. There are two renal cysts measuring up to 1.8 cm. No hydronephrosis. Left Kidney: Renal measurements: 10.4 x 5.0 x 4.1 cm = volume: 112 mL. Echogenicity within normal limits. There is one renal cyst measuring 1.6 cm. There are multiple shadowing renal calculi measuring up to 0.8 cm. No hydronephrosis. Bladder: Decompressed with Foley catheter in place. Other: None. IMPRESSION: 1.  Left renal calculi.  No hydronephrosis bilaterally. 2.  Bilateral renal cysts. Electronically Signed   By: Audie Pinto M.D.   On: 05/17/2020 11:01   DG  C-Arm 1-60 Min-No Report  Result Date: 05/17/2020 Fluoroscopy was utilized by the requesting physician.  No radiographic interpretation.     Medications:   . dextrose 5 % and 0.45% NaCl 75 mL/hr at 05/18/20 1425   . Chlorhexidine Gluconate Cloth  6 each Topical Daily  . heparin  5,000 Units Subcutaneous Q8H  . metoprolol succinate  25 mg Oral Daily  . tamsulosin  0.4 mg Oral QHS   acetaminophen **OR** acetaminophen, ondansetron **OR** ondansetron (ZOFRAN) IV  Assessment/ Plan:  1.AKI on CKD3b/4:  Baseline Cr 1.9 as of 12/2019. AKI secondary to obstruction now s/p urologic intervention with bilateral utereral stents + possible volume depletion in setting of ACEi use.  Had leukocyturia with urine culture pending as of this AM.  He's been volume expanded with IVF which I've D/Cd today as he's tolerating po intake and looks euvolemic.  This morning there is a substantial improvement in renal function with creatinine improving from 5.5 yesterday to 3.42.  Electrolytes and bicarb are normal.  Cont to hold ACEi and diuretic.  He should have foley taken out soon with trial of void prior to discharge to ensure not retaining.  Follows with Dr. Wolfgang Phoenix outpt.  2. Hypertension/volume  -BP normal this morning.  Off ACEi and chlorthalidone; remains on metoprolol 25 daily.   Would hold on  resuming ACEi for now.  3.  Anemia: modest, stable.  4.  Vitamin D deficiency: on drisdol at home.  5. Nephrolithiasis:  Per above, urology has performed bilateral ureteral stenting; he's on tamsulosin as well.    I will sign off given improving renal function.  He should f/u with Dr. Wolfgang Phoenix in 2-4 weeks after discharge.     LOS: 3 Tyler Pita @TODAY @9 :32 AM

## 2020-05-19 NOTE — Discharge Instructions (Signed)

## 2020-05-20 ENCOUNTER — Other Ambulatory Visit: Payer: Self-pay | Admitting: *Deleted

## 2020-05-20 MED ORDER — FENTANYL 25 MCG/HR TD PT72: 1.0000 | MEDICATED_PATCH | TRANSDERMAL | 0 refills | Status: DC

## 2020-05-20 NOTE — Telephone Encounter (Signed)
Received VM from patient.   Requested refill on Duragesic.   Ok to refill??  Last office visit 05/16/2020.  Last refill 04/18/2020.

## 2020-05-21 ENCOUNTER — Other Ambulatory Visit: Payer: Self-pay | Admitting: Family Medicine

## 2020-05-21 MED ORDER — CIPROFLOXACIN HCL 250 MG PO TABS: 250.0000 mg | ORAL_TABLET | Freq: Two times a day (BID) | ORAL | 0 refills | Status: AC

## 2020-05-21 NOTE — Progress Notes (Signed)
Patient discharged a couple of days ago and urine culture came back reporting pseudomonas.  Expect that his renal function should continue to improve now that obstruction has been treated.  Patient called with urine culture results and to go pick up prescription at [facility], follow up with PCP and urologist as soon as possible.   Maryln Manuel MD

## 2020-05-21 NOTE — Progress Notes (Signed)
Pt called and rx sent to [facility] for cipro 250 BID x 7d, follow up with PCP and urologist for recheck.  Pt verbalized understanding.   Maryln Manuel MD

## 2020-05-22 LAB — URINE CULTURE: Culture: 30000 — AB

## 2020-05-23 ENCOUNTER — Telehealth: Payer: Self-pay

## 2020-05-23 LAB — PROTEIN ELECTROPHORESIS, SERUM
A/G Ratio: 1.1 (ref 0.7–1.7)
Albumin ELP: 3.6 g/dL (ref 2.9–4.4)
Alpha-1-Globulin: 0.2 g/dL (ref 0.0–0.4)
Alpha-2-Globulin: 0.7 g/dL (ref 0.4–1.0)
Beta Globulin: 1 g/dL (ref 0.7–1.3)
Gamma Globulin: 1.5 g/dL (ref 0.4–1.8)
Globulin, Total: 3.4 g/dL (ref 2.2–3.9)
Total Protein ELP: 7 g/dL (ref 6.0–8.5)

## 2020-05-23 NOTE — Progress Notes (Signed)
Office Visit Note  Patient: Johnathan Baldwin             Date of Birth: 1956/07/07           MRN: [id number]             PCP: Salley Scarlet, MD Referring: Randa Lynn, MD Visit Date: 06/06/2020 Occupation: @GUAROCC @  Subjective:  +ANA   History of Present Illness: Johnathan Baldwin is a 64 y.o. male seen in consultation per request of his nephrologist for evaluation positive ANA.  Patient states that he has kidney stones and had a stent placed in his ureter.  Hilts most recent labs showed positive ANA for that reason he was referred to me.  Patient denies any history of joint pain or joint swelling.  There is no history of oral ulcers, nasal ulcers, malar rash, photosensitivity, Raynaud's phenomenon, arthritis.  There is no family history of autoimmune disease.  Activities of Daily Living:  Patient reports morning stiffness for 2 seconds.   Patient Denies nocturnal pain.  Difficulty dressing/grooming: Denies Difficulty climbing stairs: Denies Difficulty getting out of chair: Denies Difficulty using hands for taps, buttons, cutlery, and/or writing: Denies  Review of Systems  Constitutional: Negative for fatigue and night sweats.  HENT: Negative for mouth sores, mouth dryness and nose dryness.   Eyes: Negative for redness and dryness.  Respiratory: Negative for shortness of breath and difficulty breathing.   Cardiovascular: Negative for chest pain, palpitations, hypertension, irregular heartbeat and swelling in legs/feet.  Gastrointestinal: Negative for constipation and diarrhea.  Endocrine: Negative for excessive thirst and increased urination.  Genitourinary: Negative for difficulty urinating.  Musculoskeletal: Negative for arthralgias, joint pain, joint swelling, myalgias, muscle weakness, morning stiffness, muscle tenderness and myalgias.  Skin: Negative for color change, rash, hair loss, nodules/bumps, skin tightness, ulcers and sensitivity to sunlight.    Allergic/Immunologic: Negative for susceptible to infections.  Neurological: Negative for dizziness, fainting, numbness, memory loss, night sweats and weakness ( ).  Hematological: Negative for bruising/bleeding tendency and swollen glands.  Psychiatric/Behavioral: Negative for depressed mood and sleep disturbance. The patient is not nervous/anxious.     PMFS History:  Patient Active Problem List   Diagnosis Date Noted  . CKD (chronic kidney disease) stage 3, GFR 30-59 ml/min 06/02/2020  . Bilateral ureteral obstruction 05/17/2020  . S/P colostomy (HCC) 05/29/2019  . GERD (gastroesophageal reflux disease) 05/29/2019  . Chronic shoulder pain 12/08/2015  . Idiopathic proctitis 07/23/2013  . Colon cancer screening 03/23/2013  . Lung nodule 03/23/2013  . HTN (hypertension), benign 09/02/2011  . Kidney stones 09/02/2011  . Chronic abdominal pain 09/02/2011    Past Medical History:  Diagnosis Date  . Chronic abdominal pain S/P ABD. SURG FROM INJURY  . GERD (gastroesophageal reflux disease)   . Heart murmur MILD- ASYMPTOMATIC  . History of kidney stones   . Hypertension   . Left ureteral calculus   . Lung nodule 03/2012   CT scan  RIGHT MIDDLE LOBE- BENIGN    Family History  Problem Relation Age of Onset  . Hypertension Mother   . Hypertension Brother   . Hypertension Sister   . Hypertension Brother    Past Surgical History:  Procedure Laterality Date  . ABDOMINAL SURGERY AND COLOSTOMY  2000   WORK INJURY FROM MACHINE  . CYSTOSCOPY W/ URETERAL STENT PLACEMENT Bilateral 05/17/2020   Procedure: CYSTOSCOPY WITH RETROGRADE PYELOGRAM/URETERAL STENT PLACEMENT;  Surgeon: 05/19/2020, MD;  Location: [facility];  Service: Urology;  Laterality: Bilateral;  . URETEROSCOPY  July 2013   W/ stent, s/p removal for left stone   Social History   Social History Narrative  . Not on file   Immunization History  Administered Date(s) Administered  . Influenza,inj,Quad PF,6+ Mos  11/30/2019  . Influenza-Unspecified 10/20/2018  . Moderna SARS-COVID-2 Vaccination 03/04/2020, 04/15/2020  . Tdap 02/07/2012  . Zoster Recombinat (Shingrix) 05/11/2017, 08/24/2017     Objective: Vital Signs: BP (!) 177/87 (BP Location: Right Arm, Patient Position: Sitting, Cuff Size: Normal)   Pulse (!) 49   Resp 16   Ht 5' 8.5" (1.74 m)   Wt 147 lb (66.7 kg)   BMI 22.03 kg/m    Physical Exam Vitals and nursing note reviewed.  Constitutional:      Appearance: He is well-developed.  HENT:     Head: Normocephalic and atraumatic.  Eyes:     Conjunctiva/sclera: Conjunctivae normal.     Pupils: Pupils are equal, round, and reactive to light.  Cardiovascular:     Rate and Rhythm: Normal rate and regular rhythm.     Heart sounds: Normal heart sounds.  Pulmonary:     Effort: Pulmonary effort is normal.     Breath sounds: Normal breath sounds.  Abdominal:     General: Bowel sounds are normal.     Palpations: Abdomen is soft.  Musculoskeletal:     Cervical back: Normal range of motion and neck supple.  Skin:    General: Skin is warm and dry.     Capillary Refill: Capillary refill takes less than 2 seconds.  Neurological:     Mental Status: He is alert and oriented to person, place, and time.  Psychiatric:        Behavior: Behavior normal.      Musculoskeletal Exam: Spine thoracic and lumbar spine with good range of motion.  Shoulder joints, elbow joints, wrist joints, MCPs PIPs and DIPs with good range of motion with no synovitis.  Hip joints, knee joints, ankles, MTPs and PIPs are DIPs with good range of motion with no synovitis.  CDAI Exam: CDAI Score: -- Patient Global: --; Provider Global: -- Swollen: --; Tender: -- Joint Exam 06/06/2020   No joint exam has been documented for this visit   There is currently no information documented on the homunculus. Go to the Rheumatology activity and complete the homunculus joint exam.  Investigation: No additional  findings.  Imaging: US RENAL  Result Date: 05/17/2020 CLINICAL DATA:  Acute kidney injury on admission. EXAM: RENAL / URINARY TRACT ULTRASOUND COMPLETE COMPARISON:  CT renal stone protocol 05/16/2020 Hg FINDINGS: Right Kidney: Renal measurements: 9.8 x 3.6 x 6.0 cm = volume: 172 mL . Echogenicity within normal limits. There are two renal cysts measuring up to 1.8 cm. No hydronephrosis. Left Kidney: Renal measurements: 10.4 x 5.0 x 4.1 cm = volume: 112 mL. Echogenicity within normal limits. There is one renal cyst measuring 1.6 cm. There are multiple shadowing renal calculi measuring up to 0.8 cm. No hydronephrosis. Bladder: Decompressed with Foley catheter in place. Other: None. IMPRESSION: 1.  Left renal calculi.  No hydronephrosis bilaterally. 2.  Bilateral renal cysts. Electronically Signed   By: Emmaline Kluver M.D.   On: 05/17/2020 11:01   DG C-Arm 1-60 Min-No Report  Result Date: 05/17/2020 Fluoroscopy was utilized by the requesting physician.  No radiographic interpretation.   CT Renal Stone Study  Result Date: 05/16/2020 CLINICAL DATA:  Left-sided abdominal pain and vomiting for 2 days. Nephrolithiasis. Acute renal failure.  EXAM: CT ABDOMEN AND PELVIS WITHOUT CONTRAST TECHNIQUE: Multidetector CT imaging of the abdomen and pelvis was performed following the standard protocol without IV contrast. COMPARISON:  01/14/2016 from [facility] FINDINGS: Lower Chest: No acute findings. Hepatobiliary: No hepatic masses identified. Gallstones are seen, however there is no evidence of cholecystitis or biliary dilatation. Pancreas: Stable postop changes from distal pancreatectomy. No evidence of mass or inflammatory changes. Spleen: Surgically absent. Adrenals/Urinary Tract: A few small fluid attenuation cyst noted in the right kidney. Mild left renal parenchymal scarring again seen. Multiple calculi are seen in the lower pole of the left kidney measuring up to 7 mm. A 4 mm calculus is seen  in the distal left ureter near the UVJ, however there is no evidence of acute hydroureteronephrosis. Mild diffuse bladder wall thickening is seen with hazy opacity in the perivesical fat, suspicious for cystitis. Stomach/Bowel: No evidence of obstruction, inflammatory process or abnormal fluid collections. Left abdominal surgical wall defect again seen, as well as partial gastrectomy and right lower quadrant colostomy. Vascular/Lymphatic: No pathologically enlarged lymph nodes. No abdominal aortic aneurysm. Aortic atherosclerosis noted. Reproductive:  No mass or other significant abnormality. Other:  None. Musculoskeletal:  No suspicious bone lesions identified. IMPRESSION: 1. 4 mm distal left ureteral calculus near the UVJ, without significant hydroureteronephrosis. 2. Left nephrolithiasis. 3. Mild diffuse bladder wall thickening with hazy opacity in perivesical fat, suspicious for cystitis. Suggest correlation with urinalysis. 4. Cholelithiasis. No radiographic evidence of cholecystitis. Aortic Atherosclerosis (ICD10-I70.0). Electronically Signed   By: Marlaine Hind M.D.   On: 05/16/2020 17:51    Recent Labs: Lab Results  Component Value Date   WBC 4.8 06/02/2020   HGB 10.9 (L) 06/02/2020   PLT 499 (H) 06/02/2020   NA 141 06/02/2020   K 5.1 06/02/2020   CL 108 06/02/2020   CO2 27 06/02/2020   GLUCOSE 92 06/02/2020   BUN 22 06/02/2020   CREATININE 2.00 (H) 06/02/2020   BILITOT 0.5 06/02/2020   ALKPHOS 60 05/17/2020   AST 22 06/02/2020   ALT 20 06/02/2020   PROT 6.6 06/02/2020   ALBUMIN 3.6 05/18/2020   CALCIUM 8.8 06/02/2020   GFRAA 41 (L) 06/02/2020   1519 2021 ANA 1: 80, dsDNA 13, C3 normal, C4 normal, c-ANCA 1: 40 Free kappa light chain elevated, free kappa and lambda ratio normal, AGBM negative hepatitis B-, hepatitis C negative, HIV negative, BMP creatinine 1.72, GFR 49, CBC normal, iron studies normal, Speciality Comments: No specialty comments available.  Procedures:  No  procedures performed Allergies: Norvasc [amlodipine besylate]   Assessment / Plan:     Visit Diagnoses: Positive ANA (antinuclear antibody) -  -patient has no clinical features of autoimmune disease on examination.  He denies any history of oral ulcers, nasal ulcers, malar rash, photosensitivity, Raynaud's phenomenon, lymphadenopathy, joint pain or joint inflammation.  I noticed that he has chronic renal insufficiency and has positive ANA done by Dr. Theador Hawthorne.  I will defer Work-up to explore positive ANA.  Plan: ANA, Anti-scleroderma antibody, RNP Antibody, Anti-Smith antibody, Sjogrens syndrome-A extractable nuclear antibody, Sjogrens syndrome-B extractable nuclear antibody, Anti-DNA antibody, double-stranded, Beta-2 glycoprotein antibodies, Cardiolipin antibodies, IgG, IgM, IgA, Lupus Anticoagulant Eval w/Reflex  HTN (hypertension), benign -patient blood pressure is a still elevated.  I have advised him to follow-up closely with his PCP.  Lung nodule-on progress note review he had a CT scan which showed right middle lobe benign nodule.  Idiopathic proctitis  S/P colostomy ([place]) - Abdominal injury at work per patient.  He has a colostomy bag.  Gastroesophageal reflux disease without esophagitis  Stage 3b chronic kidney disease  Kidney stones  Bilateral ureteral obstruction-status post stent placement.  Calculus of gallbladder without cholecystitis without obstruction - On CT scan of the abdomen  Vitamin D deficiency-he was on treatment for vitamin D deficiency per records.  Orders: Orders Placed This Encounter  Procedures  . ANA  . Anti-scleroderma antibody  . RNP Antibody  . Anti-Smith antibody  . Sjogrens syndrome-A extractable nuclear antibody  . Sjogrens syndrome-B extractable nuclear antibody  . Anti-DNA antibody, double-stranded  . Beta-2 glycoprotein antibodies  . Cardiolipin antibodies, IgG, IgM, IgA  . Lupus Anticoagulant Eval w/Reflex  . Uric acid   No orders of  the defined types were placed in this encounter.   .  Follow-Up Instructions: Return for +ANA.   Pollyann Savoy, MD  Note - This record has been created using Animal nutritionist.  Chart creation errors have been sought, but may not always  have been located. Such creation errors do not reflect on  the standard of medical care.

## 2020-05-23 NOTE — Telephone Encounter (Signed)
-----   Message from Peter Congo sent at 05/22/2020  9:29 AM EDT ----- This pt was recently d/c from hospital. He said he was told to f/u with our office within a few weeks. Will you look over info and advise on scheduling?

## 2020-05-27 ENCOUNTER — Ambulatory Visit: Payer: 59 | Admitting: Family Medicine

## 2020-05-30 ENCOUNTER — Other Ambulatory Visit: Payer: Self-pay | Admitting: Family Medicine

## 2020-06-02 ENCOUNTER — Telehealth: Payer: Self-pay | Admitting: Urology

## 2020-06-02 ENCOUNTER — Ambulatory Visit (INDEPENDENT_AMBULATORY_CARE_PROVIDER_SITE_OTHER): Payer: 59 | Admitting: Family Medicine

## 2020-06-02 ENCOUNTER — Encounter: Payer: Self-pay | Admitting: Family Medicine

## 2020-06-02 ENCOUNTER — Other Ambulatory Visit: Payer: Self-pay

## 2020-06-02 VITALS — BP 138/78 | HR 70 | Temp 97.9°F | Resp 12 | Ht 69.0 in | Wt 146.0 lb

## 2020-06-02 DIAGNOSIS — Z0001 Encounter for general adult medical examination with abnormal findings: Secondary | ICD-10-CM

## 2020-06-02 DIAGNOSIS — Z0000 Encounter for general adult medical examination without abnormal findings: Secondary | ICD-10-CM

## 2020-06-02 DIAGNOSIS — R634 Abnormal weight loss: Secondary | ICD-10-CM

## 2020-06-02 DIAGNOSIS — N1832 Chronic kidney disease, stage 3b: Secondary | ICD-10-CM

## 2020-06-02 DIAGNOSIS — N135 Crossing vessel and stricture of ureter without hydronephrosis: Secondary | ICD-10-CM

## 2020-06-02 DIAGNOSIS — Z125 Encounter for screening for malignant neoplasm of prostate: Secondary | ICD-10-CM

## 2020-06-02 DIAGNOSIS — G8929 Other chronic pain: Secondary | ICD-10-CM

## 2020-06-02 DIAGNOSIS — N183 Chronic kidney disease, stage 3 unspecified: Secondary | ICD-10-CM | POA: Insufficient documentation

## 2020-06-02 DIAGNOSIS — I10 Essential (primary) hypertension: Secondary | ICD-10-CM

## 2020-06-02 DIAGNOSIS — N200 Calculus of kidney: Secondary | ICD-10-CM

## 2020-06-02 DIAGNOSIS — Z933 Colostomy status: Secondary | ICD-10-CM

## 2020-06-02 DIAGNOSIS — Z960 Presence of urogenital implants: Secondary | ICD-10-CM

## 2020-06-02 DIAGNOSIS — R109 Unspecified abdominal pain: Secondary | ICD-10-CM

## 2020-06-02 LAB — CBC WITH DIFFERENTIAL/PLATELET
Absolute Monocytes: 389 cells/uL (ref 200–950)
Basophils Absolute: 72 cells/uL (ref 0–200)
Basophils Relative: 1.5 %
Eosinophils Absolute: 418 cells/uL (ref 15–500)
Eosinophils Relative: 8.7 %
HCT: 33.8 % — ABNORMAL LOW (ref 38.5–50.0)
Hemoglobin: 10.9 g/dL — ABNORMAL LOW (ref 13.2–17.1)
Lymphs Abs: 878 cells/uL (ref 850–3900)
MCH: 30.8 pg (ref 27.0–33.0)
MCHC: 32.2 g/dL (ref 32.0–36.0)
MCV: 95.5 fL (ref 80.0–100.0)
MPV: 9.5 fL (ref 7.5–12.5)
Monocytes Relative: 8.1 %
Neutro Abs: 3043 cells/uL (ref 1500–7800)
Neutrophils Relative %: 63.4 %
Platelets: 499 10*3/uL — ABNORMAL HIGH (ref 140–400)
RBC: 3.54 10*6/uL — ABNORMAL LOW (ref 4.20–5.80)
RDW: 11.9 % (ref 11.0–15.0)
Total Lymphocyte: 18.3 %
WBC: 4.8 10*3/uL (ref 3.8–10.8)

## 2020-06-02 LAB — COMPLETE METABOLIC PANEL WITH GFR
AG Ratio: 1.1 (calc) (ref 1.0–2.5)
ALT: 20 U/L (ref 9–46)
AST: 22 U/L (ref 10–35)
Albumin: 3.5 g/dL — ABNORMAL LOW (ref 3.6–5.1)
Alkaline phosphatase (APISO): 74 U/L (ref 35–144)
BUN/Creatinine Ratio: 11 (calc) (ref 6–22)
BUN: 22 mg/dL (ref 7–25)
CO2: 27 mmol/L (ref 20–32)
Calcium: 8.8 mg/dL (ref 8.6–10.3)
Chloride: 108 mmol/L (ref 98–110)
Creat: 2 mg/dL — ABNORMAL HIGH (ref 0.70–1.25)
GFR, Est African American: 41 mL/min/{1.73_m2} — ABNORMAL LOW (ref >=60)
GFR, Est Non African American: 35 mL/min/{1.73_m2} — ABNORMAL LOW (ref >=60)
Globulin: 3.1 g/dL (calc) (ref 1.9–3.7)
Glucose, Bld: 92 mg/dL (ref 65–99)
Potassium: 5.1 mmol/L (ref 3.5–5.3)
Sodium: 141 mmol/L (ref 135–146)
Total Bilirubin: 0.5 mg/dL (ref 0.2–1.2)
Total Protein: 6.6 g/dL (ref 6.1–8.1)

## 2020-06-02 LAB — LIPID PANEL
Cholesterol: 147 mg/dL (ref <200)
HDL: 73 mg/dL (ref >=40)
LDL Cholesterol (Calc): 60 mg/dL (calc)
Non-HDL Cholesterol (Calc): 74 mg/dL (calc) (ref <130)
Total CHOL/HDL Ratio: 2 (calc) (ref <5.0)
Triglycerides: 68 mg/dL (ref <150)

## 2020-06-02 LAB — PSA: PSA: 1.1 ng/mL (ref <=4.0)

## 2020-06-02 MED ORDER — TAMSULOSIN HCL 0.4 MG PO CAPS: 0.4000 mg | ORAL_CAPSULE | Freq: Every day | ORAL | 0 refills | Status: DC

## 2020-06-02 NOTE — Assessment & Plan Note (Signed)
Controlled Keep holding ACE, duiretic Recheck renal function with GFR He is going to reschedule with urology

## 2020-06-02 NOTE — Telephone Encounter (Signed)
Follow up from surgery still has not been scheduled. Is there any time you see for for me to schedule?

## 2020-06-02 NOTE — Telephone Encounter (Signed)
Pt needs ov or will he be scheduled again for another surgery?

## 2020-06-02 NOTE — Progress Notes (Signed)
Subjective:    Patient ID: Johnathan Baldwin, male    DOB: [birth year], 64 y.o.   MRN: [id number]  Patient presents for Annual Exam (is fasting)  Pt here for CPE, medications and history reviewed- updated Pt also here for  hospital follow-up.  He is admitted secondary to bilateral ureteral obstruction he had stents placed by urology.  He did have Foley while in the hospital but this was removed prior to discharge. He has CKD stage 3 but Creatinine peaked at  8.99 on admission. Urology was consulted and uretal stents placed, he was also given flomax. Cr at discharge was down to 3.42 ( baseline  1.8-1.9)  Hypertension he was continued on metoprolol however his lisinopril and chlorthalidone were held until he was seen by nephrology.   He was continued on his Pepcid  Pain was continued on his fentanyl patch  Due for colonoscopy will defer visit in the fall   Immunizations- UTD, including COVID-19 vaccine   Typically works out at the gym at least 5 days a week  Weight down 10lbs since Jan, occurred with illness   Review Of Systems:  GEN- denies fatigue, fever, weight loss,weakness, recent illness HEENT- denies eye drainage, change in vision, nasal discharge, CVS- denies chest pain, palpitations RESP- denies SOB, cough, wheeze ABD- denies N/V, change in stools, abd pain GU- denies dysuria, hematuria, dribbling, incontinence MSK- denies joint pain, muscle aches, injury Neuro- denies headache, dizziness, syncope, seizure activity       Objective:    BP 138/78   Pulse 70   Temp 97.9 F (36.6 C) (Temporal)   Resp 12   Ht 5\' 9"  (1.753 m)   Wt 146 lb (66.2 kg)   SpO2 99%   BMI 21.56 kg/m  GEN- NAD, alert and oriented x3 HEENT- PERRL, EOMI, non injected sclera, pink conjunctiva, MMM, oropharynx clear Neck- Supple, no thyromegaly CVS- RRR, no murmur RESP-CTAB ABD-NABS,soft,NT,ND, colostomy in tact, no CVA tenderness Psych- normal affect and mood  EXT- No edema Pulses-  Radial, DP- 2+  FALL/DEPRESSION/CAGE screening negative       Assessment & Plan:      Problem List Items Addressed This Visit      Unprioritized   Bilateral ureteral obstruction    Bilat obstruction s/p stent placement Urgent f/u referral placed for urology Continue flomax Urinating back to baseline Planning for stone extraction       Chronic abdominal pain    Continue fentayl patch      CKD (chronic kidney disease) stage 3, GFR 30-59 ml/min   Relevant Orders   Ambulatory referral to Urology   COMPLETE METABOLIC PANEL WITH GFR   HTN (hypertension), benign    Controlled Keep holding ACE, duiretic Recheck renal function with GFR He is going to reschedule with urology       Relevant Orders   CBC with Differential/Platelet   COMPLETE METABOLIC PANEL WITH GFR   Lipid panel   Kidney stones   Relevant Orders   Ambulatory referral to Urology   S/P colostomy [facility])    Other Visit Diagnoses    Routine general medical examination at a health care facility    -  Primary   CPE done, immunizations UTD, PSA screening, defer colonoscopy until renal function/stone situation is setted, due to colostomy requires specilzed screening   Relevant Orders   CBC with Differential/Platelet   COMPLETE METABOLIC PANEL WITH GFR   S/P ureteral stent placement       Relevant Orders  Ambulatory referral to Urology   Prostate cancer screening       Relevant Orders   PSA   Weight loss       in setting of recent illness/surgery, he often loses weight fairly quickly. Appetite now improved expect pt to regain slowly      Note: This dictation was prepared with Dragon dictation along with smaller phrase technology. Any transcriptional errors that result from this process are unintentional.

## 2020-06-02 NOTE — Assessment & Plan Note (Signed)
Continue fentayl patch

## 2020-06-02 NOTE — Addendum Note (Signed)
Addended by: Elmwood, Enmanuel Zufall F on: 06/02/2020 12:21 PM   Modules accepted: Level of Service  

## 2020-06-02 NOTE — Patient Instructions (Addendum)
F/u 5 MONTH  We will schedule you with urology Call your kidney doctor for a follow up

## 2020-06-02 NOTE — Assessment & Plan Note (Addendum)
Bilat obstruction s/p stent placement Urgent f/u referral placed for urology Continue flomax Urinating back to baseline Planning for stone extraction

## 2020-06-04 NOTE — Telephone Encounter (Signed)
Called pt left. Message to return call to discuss upcoming surgery date. Posting sheet received from Dr. Ronne Binning

## 2020-06-05 NOTE — Telephone Encounter (Signed)
Pt returned my call today. Notified of surgery date. Pt voiced understanding.

## 2020-06-06 ENCOUNTER — Encounter: Payer: Self-pay | Admitting: Rheumatology

## 2020-06-06 ENCOUNTER — Other Ambulatory Visit: Payer: Self-pay

## 2020-06-06 ENCOUNTER — Ambulatory Visit: Payer: 59 | Admitting: Rheumatology

## 2020-06-06 VITALS — BP 177/87 | HR 49 | Resp 16 | Ht 68.5 in | Wt 147.0 lb

## 2020-06-06 DIAGNOSIS — Z933 Colostomy status: Secondary | ICD-10-CM

## 2020-06-06 DIAGNOSIS — N135 Crossing vessel and stricture of ureter without hydronephrosis: Secondary | ICD-10-CM

## 2020-06-06 DIAGNOSIS — R911 Solitary pulmonary nodule: Secondary | ICD-10-CM | POA: Diagnosis not present

## 2020-06-06 DIAGNOSIS — I10 Essential (primary) hypertension: Secondary | ICD-10-CM | POA: Diagnosis not present

## 2020-06-06 DIAGNOSIS — R768 Other specified abnormal immunological findings in serum: Secondary | ICD-10-CM

## 2020-06-06 DIAGNOSIS — K8020 Calculus of gallbladder without cholecystitis without obstruction: Secondary | ICD-10-CM

## 2020-06-06 DIAGNOSIS — N200 Calculus of kidney: Secondary | ICD-10-CM

## 2020-06-06 DIAGNOSIS — K6289 Other specified diseases of anus and rectum: Secondary | ICD-10-CM | POA: Diagnosis not present

## 2020-06-06 DIAGNOSIS — N1832 Chronic kidney disease, stage 3b: Secondary | ICD-10-CM

## 2020-06-06 DIAGNOSIS — K219 Gastro-esophageal reflux disease without esophagitis: Secondary | ICD-10-CM

## 2020-06-06 DIAGNOSIS — E559 Vitamin D deficiency, unspecified: Secondary | ICD-10-CM

## 2020-06-07 LAB — URIC ACID: Uric Acid, Serum: 6.5 mg/dL (ref 4.0–8.0)

## 2020-06-09 IMAGING — MR MR FEMUR*R* W/O CM
4 of 7 series · 19 of 40 positions shown · non-contrast
Comparison: None.

CLINICAL DATA: Right distal thigh mass for the past 3 months.

EXAM:
MRI OF THE RIGHT FEMUR WITHOUT CONTRAST
TECHNIQUE: Multiplanar, multisequence MR imaging of the right femur was
performed. No intravenous contrast was administered.

[Series 3: T1 · coronal · 4.0mm · 0.98mm/px · 3 of 28 slices shown (1 of 3)]
[im 1/28]
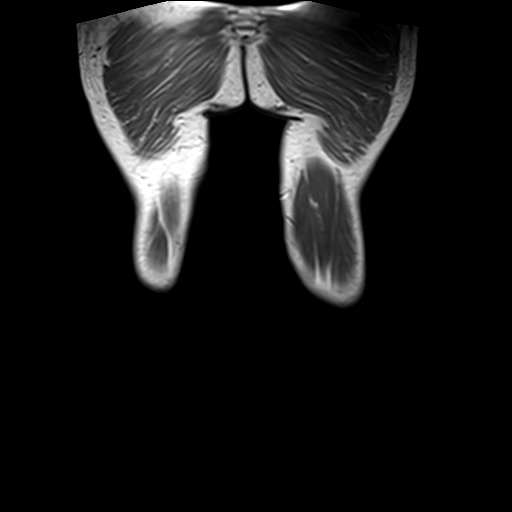
[im 14/28]
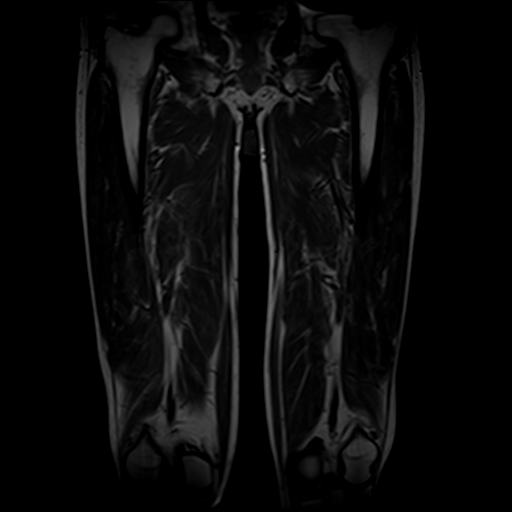
[im 28/28]
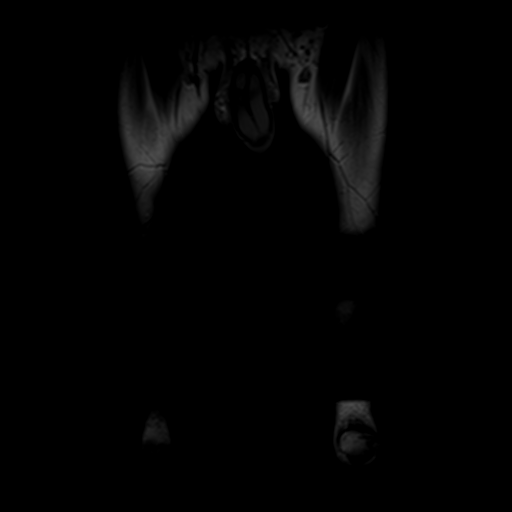

[Series 4: ir cor · coronal · 4.0mm · 0.98mm/px · 3 of 28 slices shown]
[im 1/28]
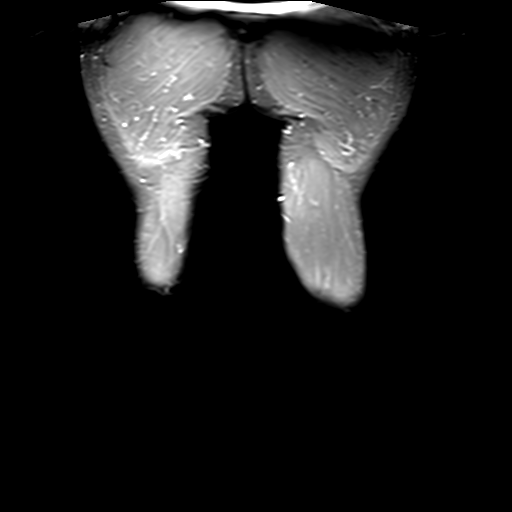
[im 19/28]
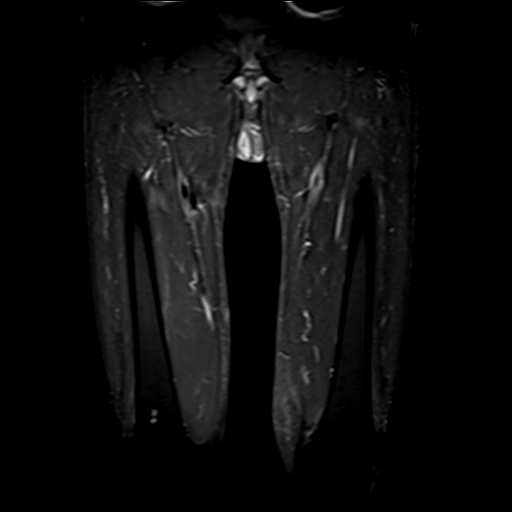
[im 28/28]
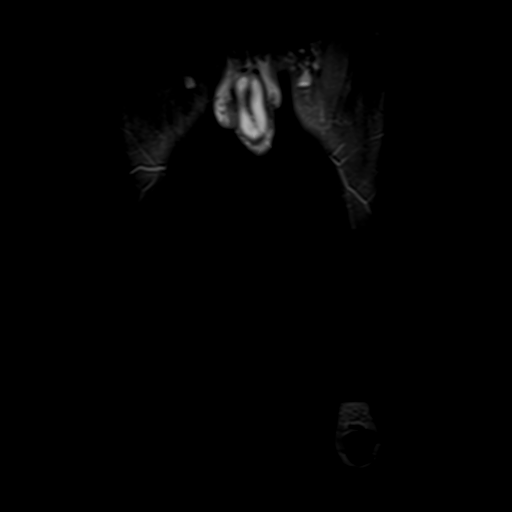

[Series 5: T1 · axial · 4.5mm · 0.39mm/px · z∈[+45,+265]mm · 6 of 40 slices shown (2 of 3)]
[im 1/40]
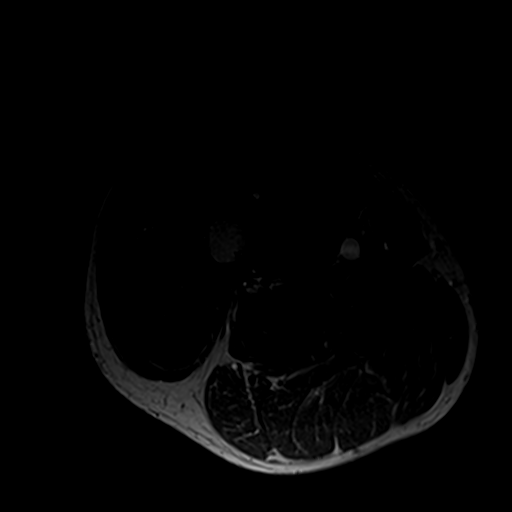
[im 8/40]
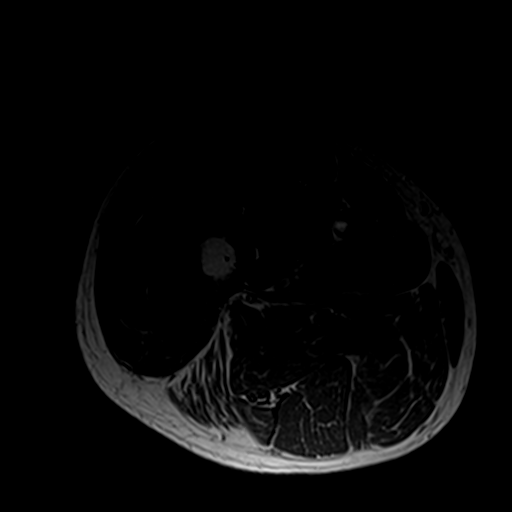
[im 16/40]
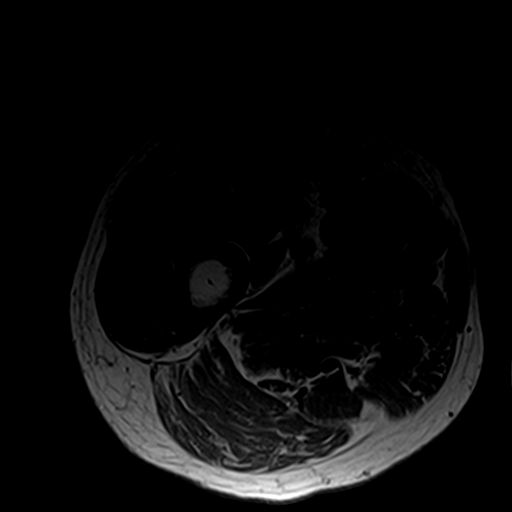
[im 24/40]
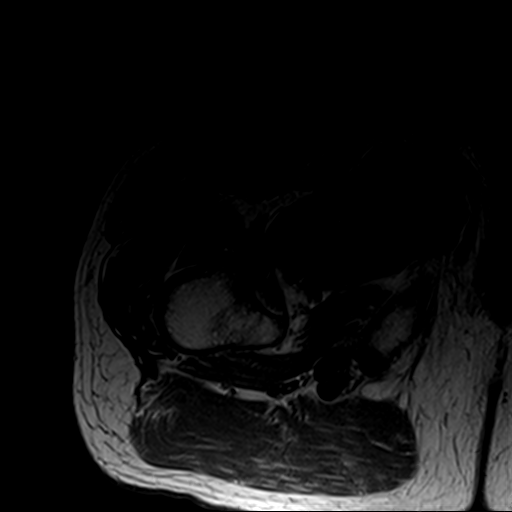
[im 32/40]
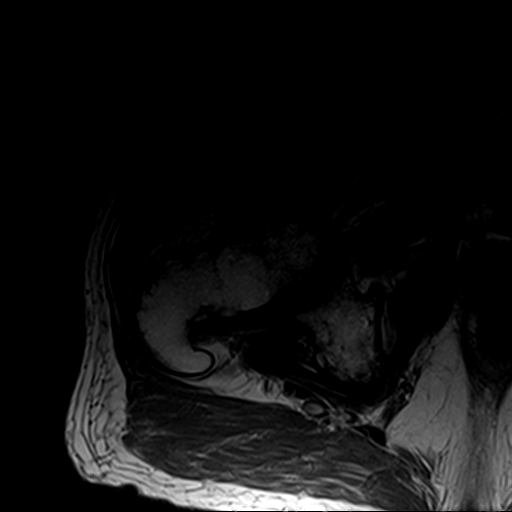
[im 40/40]
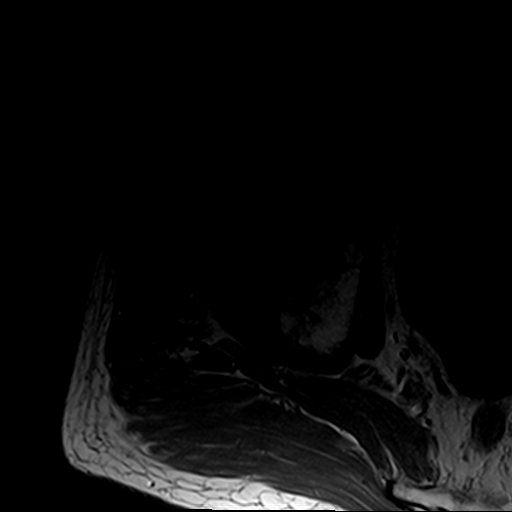

[Series 6: T1 · axial · 4.5mm · 0.39mm/px · z∈[-224,+29]mm · 7 of 54 slices shown (3 of 3)]
[im 1/54]
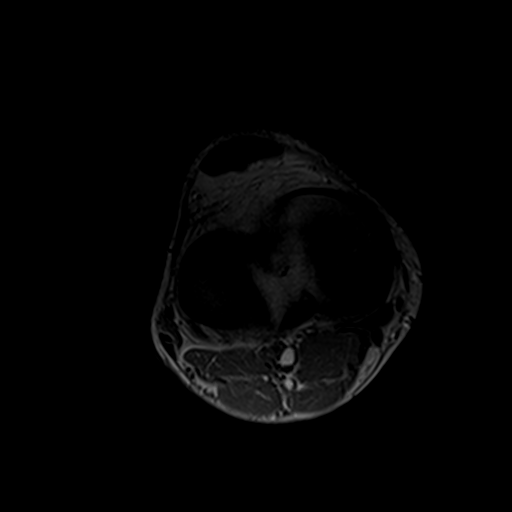
[im 8/54]
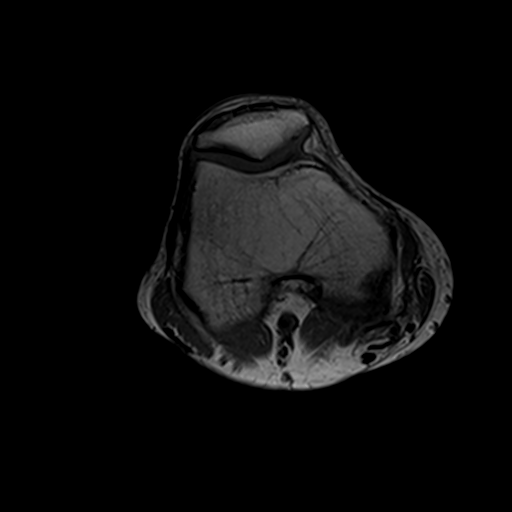
[im 16/54]
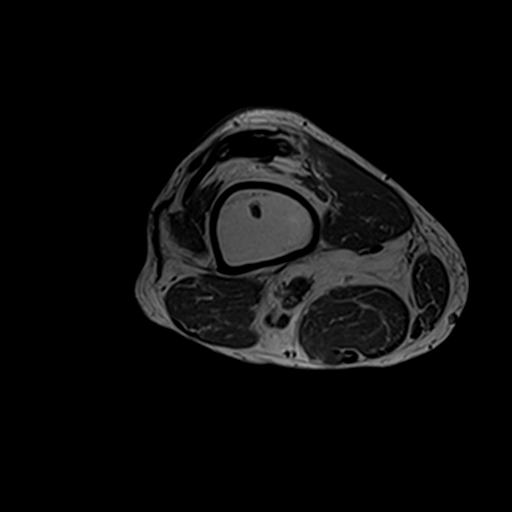
[im 23/54]
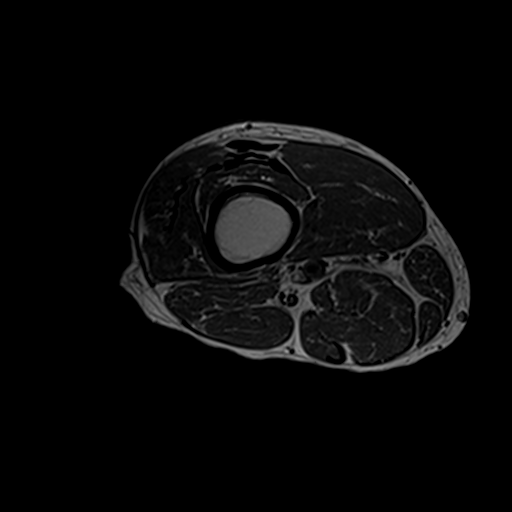
[im 31/54]
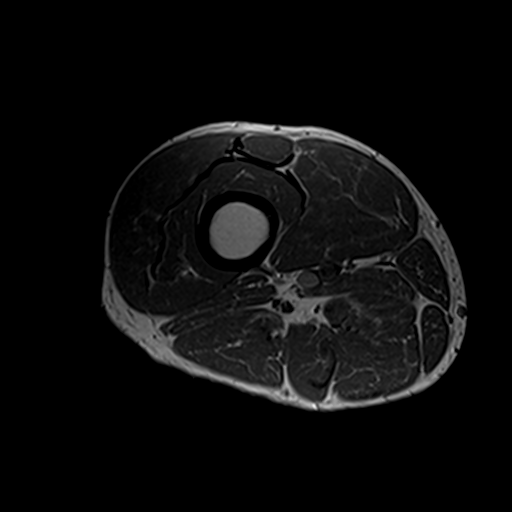
[im 38/54]
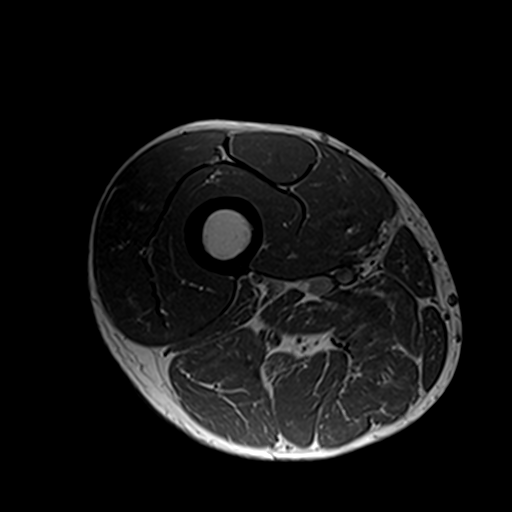
[im 46/54]
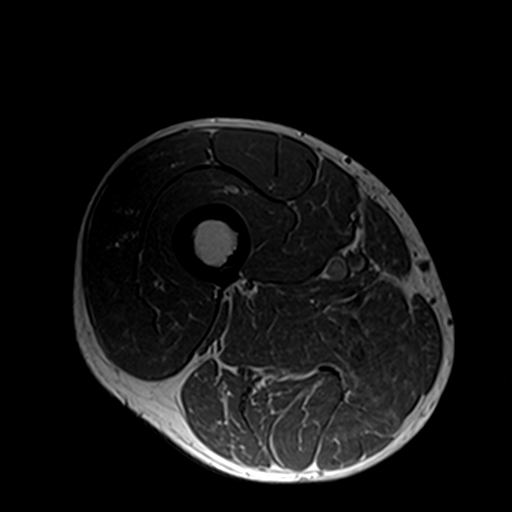

[19 of 40 positions shown; findings below may reference images not displayed]

FINDINGS: Bones/Joint/Cartilage

No suspicious marrow signal abnormality. Prominent nutrient foramina
in both distal femoral metaphyses with small prominent
intramedullary vascular channel in the right distal femur. No
fracture or dislocation. Normal alignment. No joint effusion.

Muscles and Tendons
Visualized tendons are intact.  No muscle edema or atrophy.

Soft tissue
Skin markers are noted along the distal medial right thigh,
extending from the metadiaphysis to the medial femoral condyle.
There is no soft tissue mass. There is a small amount of loculated
fluid deep to the sartorius myotendinous junction.
IMPRESSION: 1. No discrete soft tissue mass along the medial distal right femur
at the site of the palpable abnormality.
2. Small amount of loculated fluid deep to the right sartorius
myotendinous junction, nonspecific, not clearly within the
semimembranosus bursa, and too superior to be within the pes
anserine bursa. This could be better evaluated with dedicated knee
MRI if clinically indicated.

## 2020-06-10 NOTE — Progress Notes (Signed)
I will discuss results at the follow-up visit.

## 2020-06-11 ENCOUNTER — Other Ambulatory Visit: Payer: Self-pay

## 2020-06-11 ENCOUNTER — Encounter (HOSPITAL_COMMUNITY): Payer: Self-pay

## 2020-06-11 LAB — SJOGRENS SYNDROME-A EXTRACTABLE NUCLEAR ANTIBODY: SSA (Ro) (ENA) Antibody, IgG: <1 AI

## 2020-06-11 LAB — LUPUS ANTICOAGULANT EVAL W/ REFLEX
PTT-LA Screen: 38 s (ref <=40)
dRVVT: 40 s (ref <=45)

## 2020-06-11 LAB — SJOGRENS SYNDROME-B EXTRACTABLE NUCLEAR ANTIBODY: SSB (La) (ENA) Antibody, IgG: <1 AI

## 2020-06-11 LAB — ANA: Anti Nuclear Antibody (ANA): POSITIVE — AB

## 2020-06-11 LAB — CARDIOLIPIN ANTIBODIES, IGG, IGM, IGA
Anticardiolipin IgA: <2 APL-U/mL
Anticardiolipin IgG: <2 GPL-U/mL
Anticardiolipin IgM: <2 MPL-U/mL

## 2020-06-11 LAB — BETA-2 GLYCOPROTEIN ANTIBODIES
Beta-2 Glyco 1 IgA: <2 U/mL
Beta-2 Glyco 1 IgM: <2 U/mL
Beta-2 Glyco I IgG: <2 U/mL

## 2020-06-11 LAB — RNP ANTIBODY: Ribonucleic Protein(ENA) Antibody, IgG: <1 AI

## 2020-06-11 LAB — ANTI-SMITH ANTIBODY: ENA SM Ab Ser-aCnc: <1 AI

## 2020-06-11 LAB — ANTI-SCLERODERMA ANTIBODY: Scleroderma (Scl-70) (ENA) Antibody, IgG: <1 AI

## 2020-06-11 LAB — ANTI-NUCLEAR AB-TITER (ANA TITER): ANA Titer 1: 1:40 {titer} — ABNORMAL HIGH

## 2020-06-11 LAB — ANTI-DNA ANTIBODY, DOUBLE-STRANDED: ds DNA Ab: 14 IU/mL — ABNORMAL HIGH

## 2020-06-12 ENCOUNTER — Encounter (HOSPITAL_COMMUNITY)
Admission: RE | Admit: 2020-06-12 | Discharge: 2020-06-12 | Disposition: A | Discharge status: Home / Self Care | Payer: 59 | Source: Ambulatory Visit | Attending: Urology | Admitting: Urology

## 2020-06-17 ENCOUNTER — Other Ambulatory Visit: Payer: Self-pay | Admitting: Family Medicine

## 2020-06-19 ENCOUNTER — Other Ambulatory Visit: Payer: Self-pay

## 2020-06-19 NOTE — Telephone Encounter (Signed)
Requested Prescriptions   Pending Prescriptions Disp Refills   fentaNYL (DURAGESIC) 25 MCG/HR 10 patch 0    Sig: Place 1 patch onto the skin every 3 (three) days.     Last OV 06/02/2020   Last written 05/20/2020

## 2020-06-20 MED ORDER — FENTANYL 25 MCG/HR TD PT72: 1.0000 | MEDICATED_PATCH | TRANSDERMAL | 0 refills | Status: DC

## 2020-06-22 NOTE — Progress Notes (Deleted)
Office Visit Note  Patient: Johnathan Baldwin             Date of Birth: 08-17-1956           MRN: [id number]             PCP: Salley Scarlet, MD Referring: Salley Scarlet, MD Visit Date: 07/04/2020 Occupation: @GUAROCC @  Subjective:  No chief complaint on file.   History of Present Illness: Johnathan Baldwin is a 64 y.o. male ***   Activities of Daily Living:  Patient reports morning stiffness for *** {minute/hour:19697}.   Patient {ACTIONS;DENIES/REPORTS:21021675::"Denies"} nocturnal pain.  Difficulty dressing/grooming: {ACTIONS;DENIES/REPORTS:21021675::"Denies"} Difficulty climbing stairs: {ACTIONS;DENIES/REPORTS:21021675::"Denies"} Difficulty getting out of chair: {ACTIONS;DENIES/REPORTS:21021675::"Denies"} Difficulty using hands for taps, buttons, cutlery, and/or writing: {ACTIONS;DENIES/REPORTS:21021675::"Denies"}  No Rheumatology ROS completed.   PMFS History:  Patient Active Problem List   Diagnosis Date Noted  . CKD (chronic kidney disease) stage 3, GFR 30-59 ml/min 06/02/2020  . Bilateral ureteral obstruction 05/17/2020  . S/P colostomy (HCC) 05/29/2019  . GERD (gastroesophageal reflux disease) 05/29/2019  . Chronic shoulder pain 12/08/2015  . Idiopathic proctitis 07/23/2013  . Colon cancer screening 03/23/2013  . Lung nodule 03/23/2013  . HTN (hypertension), benign 09/02/2011  . Kidney stones 09/02/2011  . Chronic abdominal pain 09/02/2011    Past Medical History:  Diagnosis Date  . Chronic abdominal pain S/P ABD. SURG FROM INJURY  . GERD (gastroesophageal reflux disease)   . Heart murmur MILD- ASYMPTOMATIC  . History of kidney stones   . Hypertension   . Left ureteral calculus   . Lung nodule 03/2012   CT scan  RIGHT MIDDLE LOBE- BENIGN    Family History  Problem Relation Age of Onset  . Hypertension Mother   . Hypertension Brother   . Hypertension Sister   . Hypertension Brother    Past Surgical History:  Procedure Laterality Date  .  ABDOMINAL SURGERY AND COLOSTOMY  2000   WORK INJURY FROM MACHINE  . CYSTOSCOPY W/ URETERAL STENT PLACEMENT Bilateral 05/17/2020   Procedure: CYSTOSCOPY WITH RETROGRADE PYELOGRAM/URETERAL STENT PLACEMENT;  Surgeon: 05/19/2020, MD;  Location: [facility];  Service: Urology;  Laterality: Bilateral;  . URETEROSCOPY  July 2013   W/ stent, s/p removal for left stone   Social History   Social History Narrative  . Not on file   Immunization History  Administered Date(s) Administered  . Influenza,inj,Quad PF,6+ Mos 11/30/2019  . Influenza-Unspecified 10/20/2018  . Moderna SARS-COVID-2 Vaccination 03/04/2020, 04/15/2020  . Tdap 02/07/2012  . Zoster Recombinat (Shingrix) 05/11/2017, 08/24/2017     Objective: Vital Signs: There were no vitals taken for this visit.   Physical Exam   Musculoskeletal Exam: ***  CDAI Exam: CDAI Score: -- Patient Global: --; Provider Global: -- Swollen: --; Tender: -- Joint Exam 07/04/2020   No joint exam has been documented for this visit   There is currently no information documented on the homunculus. Go to the Rheumatology activity and complete the homunculus joint exam.  Investigation: No additional findings.  Imaging: No results found.  Recent Labs: Lab Results  Component Value Date   WBC 4.8 06/02/2020   HGB 10.9 (L) 06/02/2020   PLT 499 (H) 06/02/2020   NA 141 06/02/2020   K 5.1 06/02/2020   CL 108 06/02/2020   CO2 27 06/02/2020   GLUCOSE 92 06/02/2020   BUN 22 06/02/2020   CREATININE 2.00 (H) 06/02/2020   BILITOT 0.5 06/02/2020   ALKPHOS 60 05/17/2020   AST 22 06/02/2020  ALT 20 06/02/2020   PROT 6.6 06/02/2020   ALBUMIN 3.6 05/18/2020   CALCIUM 8.8 06/02/2020   GFRAA 41 (L) 06/02/2020   June 06, 2020 lupus anticoagulant negative, beta-2 negative, anticardiolipin negative, ANA 1: 40NS, dsDNA 14, (SCL 70, RNP, Smith, Ro, La negative), uric acid 6.5  0519 2021 ANA 1: 80, dsDNA 13, C3 normal, C4 normal, c-ANCA 1: 40 Free  kappa light chain elevated, free kappa and lambda ratio normal, AGBM negative hepatitis B-, hepatitis C negative, HIV negative, BMP creatinine 1.72, GFR 49, CBC normal, iron studies normal,  Speciality Comments: No specialty comments available.  Procedures:  No procedures performed Allergies: Norvasc [amlodipine besylate]   Assessment / Plan:     Visit Diagnoses: No diagnosis found.  Orders: No orders of the defined types were placed in this encounter.  No orders of the defined types were placed in this encounter.   Face-to-face time spent with patient was *** minutes. Greater than 50% of time was spent in counseling and coordination of care.  Follow-Up Instructions: No follow-ups on file.   Pollyann Savoy, MD  Note - This record has been created using Animal nutritionist.  Chart creation errors have been sought, but may not always  have been located. Such creation errors do not reflect on  the standard of medical care.

## 2020-06-24 ENCOUNTER — Other Ambulatory Visit: Payer: Self-pay

## 2020-06-24 ENCOUNTER — Other Ambulatory Visit (HOSPITAL_COMMUNITY)
Admission: RE | Admit: 2020-06-24 | Discharge: 2020-06-24 | Disposition: A | Discharge status: Home / Self Care | Payer: 59 | Source: Ambulatory Visit | Attending: Urology | Admitting: Urology

## 2020-06-24 DIAGNOSIS — Z20822 Contact with and (suspected) exposure to covid-19: Secondary | ICD-10-CM | POA: Diagnosis not present

## 2020-06-24 DIAGNOSIS — Z01812 Encounter for preprocedural laboratory examination: Secondary | ICD-10-CM | POA: Diagnosis present

## 2020-06-24 LAB — SARS CORONAVIRUS 2 (TAT 6-24 HRS): SARS Coronavirus 2: NEGATIVE

## 2020-06-25 ENCOUNTER — Ambulatory Visit (HOSPITAL_COMMUNITY)
Admission: RE | Admit: 2020-06-25 | Discharge: 2020-06-25 | Disposition: A | Discharge status: Home / Self Care | Payer: 59 | Attending: Urology | Admitting: Urology

## 2020-06-25 ENCOUNTER — Ambulatory Visit (HOSPITAL_COMMUNITY): Payer: 59

## 2020-06-25 ENCOUNTER — Encounter (HOSPITAL_COMMUNITY)
Admission: RE | Disposition: A | Discharge status: Home / Self Care | Payer: Self-pay | Source: Home / Self Care | Attending: Urology

## 2020-06-25 ENCOUNTER — Ambulatory Visit (HOSPITAL_COMMUNITY): Payer: 59 | Admitting: Anesthesiology

## 2020-06-25 ENCOUNTER — Encounter (HOSPITAL_COMMUNITY): Payer: Self-pay | Admitting: Urology

## 2020-06-25 DIAGNOSIS — K219 Gastro-esophageal reflux disease without esophagitis: Secondary | ICD-10-CM | POA: Insufficient documentation

## 2020-06-25 DIAGNOSIS — Z933 Colostomy status: Secondary | ICD-10-CM | POA: Diagnosis not present

## 2020-06-25 DIAGNOSIS — Z20822 Contact with and (suspected) exposure to covid-19: Secondary | ICD-10-CM | POA: Insufficient documentation

## 2020-06-25 DIAGNOSIS — I10 Essential (primary) hypertension: Secondary | ICD-10-CM | POA: Diagnosis not present

## 2020-06-25 DIAGNOSIS — G8929 Other chronic pain: Secondary | ICD-10-CM | POA: Diagnosis not present

## 2020-06-25 DIAGNOSIS — N201 Calculus of ureter: Secondary | ICD-10-CM | POA: Insufficient documentation

## 2020-06-25 DIAGNOSIS — Z87442 Personal history of urinary calculi: Secondary | ICD-10-CM | POA: Insufficient documentation

## 2020-06-25 DIAGNOSIS — N135 Crossing vessel and stricture of ureter without hydronephrosis: Secondary | ICD-10-CM

## 2020-06-25 HISTORY — PX: CYSTOSCOPY WITH RETROGRADE PYELOGRAM, URETEROSCOPY AND STENT PLACEMENT: SHX5789

## 2020-06-25 SURGERY — CYSTOURETEROSCOPY, WITH RETROGRADE PYELOGRAM AND STENT INSERTION
Anesthesia: General | Laterality: Bilateral

## 2020-06-25 MED ORDER — DIATRIZOATE MEGLUMINE 30 % UR SOLN
URETHRAL | Status: DC | PRN
  Administered 2020-06-25 (×2): 10 mL

## 2020-06-25 MED ORDER — ONDANSETRON HCL 4 MG/2ML IJ SOLN
INTRAMUSCULAR | Status: AC
  Filled 2020-06-25: qty 2

## 2020-06-25 MED ORDER — ORAL CARE MOUTH RINSE: 15.0000 mL | Freq: Once | OROMUCOSAL | Status: AC

## 2020-06-25 MED ORDER — SODIUM CHLORIDE 0.9 % IV SOLN
2.0000 g | INTRAVENOUS | Status: AC
  Administered 2020-06-25: 2 g via INTRAVENOUS
  Filled 2020-06-25: qty 20

## 2020-06-25 MED ORDER — LABETALOL HCL 5 MG/ML IV SOLN
INTRAVENOUS | Status: AC
  Filled 2020-06-25: qty 4

## 2020-06-25 MED ORDER — SODIUM CHLORIDE 0.9 % IR SOLN
Status: DC | PRN
  Administered 2020-06-25: 3000 mL
  Administered 2020-06-25: 3000 mL via INTRAVESICAL

## 2020-06-25 MED ORDER — DIATRIZOATE MEGLUMINE 30 % UR SOLN
URETHRAL | Status: AC
  Filled 2020-06-25: qty 100

## 2020-06-25 MED ORDER — CHLORHEXIDINE GLUCONATE 0.12 % MT SOLN
15.0000 mL | Freq: Once | OROMUCOSAL | Status: AC
  Administered 2020-06-25: 15 mL via OROMUCOSAL
  Filled 2020-06-25: qty 15

## 2020-06-25 MED ORDER — LABETALOL HCL 5 MG/ML IV SOLN
5.0000 mg | Freq: Once | INTRAVENOUS | Status: AC
  Administered 2020-06-25: 5 mg via INTRAVENOUS

## 2020-06-25 MED ORDER — PROMETHAZINE HCL 25 MG/ML IJ SOLN: 6.2500 mg | INTRAMUSCULAR | Status: DC | PRN

## 2020-06-25 MED ORDER — HYDROMORPHONE HCL 1 MG/ML IJ SOLN
0.2500 mg | INTRAMUSCULAR | Status: DC | PRN
  Filled 2020-06-25: qty 0.5

## 2020-06-25 MED ORDER — FENTANYL CITRATE (PF) 100 MCG/2ML IJ SOLN
INTRAMUSCULAR | Status: DC | PRN
  Administered 2020-06-25 (×4): 50 ug via INTRAVENOUS

## 2020-06-25 MED ORDER — GLYCOPYRROLATE 0.2 MG/ML IJ SOLN
0.2000 mg | Freq: Once | INTRAMUSCULAR | Status: AC
  Administered 2020-06-25 (×2): .2 mg via INTRAVENOUS

## 2020-06-25 MED ORDER — OXYCODONE-ACETAMINOPHEN 5-325 MG PO TABS: 1.0000 | ORAL_TABLET | ORAL | 0 refills | Status: DC | PRN

## 2020-06-25 MED ORDER — WATER FOR IRRIGATION, STERILE IR SOLN
Status: DC | PRN
  Administered 2020-06-25: 500 mL

## 2020-06-25 MED ORDER — FENTANYL CITRATE (PF) 100 MCG/2ML IJ SOLN
INTRAMUSCULAR | Status: AC
  Filled 2020-06-25: qty 2

## 2020-06-25 MED ORDER — LIDOCAINE HCL (CARDIAC) PF 100 MG/5ML IV SOSY
PREFILLED_SYRINGE | INTRAVENOUS | Status: DC | PRN
  Administered 2020-06-25: 100 mg via INTRAVENOUS

## 2020-06-25 MED ORDER — MIDAZOLAM HCL 5 MG/5ML IJ SOLN
INTRAMUSCULAR | Status: DC | PRN
  Administered 2020-06-25: 2 mg via INTRAVENOUS

## 2020-06-25 MED ORDER — SODIUM CHLORIDE 0.9 % IV SOLN: INTRAVENOUS | Status: DC

## 2020-06-25 MED ORDER — MIDAZOLAM HCL 2 MG/2ML IJ SOLN
INTRAMUSCULAR | Status: AC
  Filled 2020-06-25: qty 2

## 2020-06-25 MED ORDER — LIDOCAINE 2% (20 MG/ML) 5 ML SYRINGE
INTRAMUSCULAR | Status: AC
  Filled 2020-06-25: qty 5

## 2020-06-25 MED ORDER — ONDANSETRON HCL 4 MG/2ML IJ SOLN
INTRAMUSCULAR | Status: DC | PRN
  Administered 2020-06-25: 4 mg via INTRAVENOUS

## 2020-06-25 MED ORDER — GLYCOPYRROLATE PF 0.2 MG/ML IJ SOSY
PREFILLED_SYRINGE | INTRAMUSCULAR | Status: AC
  Filled 2020-06-25: qty 1

## 2020-06-25 MED ORDER — LABETALOL HCL 5 MG/ML IV SOLN
10.0000 mg | Freq: Once | INTRAVENOUS | Status: AC
  Administered 2020-06-25: 10 mg via INTRAVENOUS

## 2020-06-25 MED ORDER — PROPOFOL 10 MG/ML IV BOLUS
INTRAVENOUS | Status: DC | PRN
  Administered 2020-06-25: 150 mg via INTRAVENOUS
  Administered 2020-06-25: 50 mg via INTRAVENOUS

## 2020-06-25 SURGICAL SUPPLY — 22 items
BAG DRAIN URO TABLE W/ADPT NS (BAG) ×3 IMPLANT
BAG DRN 8 ADPR NS SKTRN CSTL (BAG) ×1
BAG HAMPER (MISCELLANEOUS) ×3 IMPLANT
CATH INTERMIT  6FR 70CM (CATHETERS) ×3 IMPLANT
CLOTH BEACON ORANGE TIMEOUT ST (SAFETY) ×3 IMPLANT
DECANTER SPIKE VIAL GLASS SM (MISCELLANEOUS) ×3 IMPLANT
EXTRACTOR STONE NITINOL NGAGE (UROLOGICAL SUPPLIES) ×2 IMPLANT
GLOVE BIO SURGEON STRL SZ8 (GLOVE) ×3 IMPLANT
GLOVE BIOGEL PI IND STRL 7.0 (GLOVE) ×3 IMPLANT
GLOVE BIOGEL PI INDICATOR 7.0 (GLOVE) ×6
GOWN STRL REUS W/TWL LRG LVL3 (GOWN DISPOSABLE) ×3 IMPLANT
GOWN STRL REUS W/TWL XL LVL3 (GOWN DISPOSABLE) ×3 IMPLANT
GUIDEWIRE STR DUAL SENSOR (WIRE) ×2 IMPLANT
GUIDEWIRE STR ZIPWIRE 035X150 (MISCELLANEOUS) ×3 IMPLANT
IV NS IRRIG 3000ML ARTHROMATIC (IV SOLUTION) ×6 IMPLANT
KIT TURNOVER CYSTO (KITS) ×3 IMPLANT
MANIFOLD NEPTUNE II (INSTRUMENTS) ×3 IMPLANT
PACK CYSTO (CUSTOM PROCEDURE TRAY) ×2 IMPLANT
PAD ARMBOARD 7.5X6 YLW CONV (MISCELLANEOUS) ×3 IMPLANT
SYR 10ML LL (SYRINGE) ×3 IMPLANT
TOWEL OR 17X26 4PK STRL BLUE (TOWEL DISPOSABLE) ×3 IMPLANT
WATER STERILE IRR 500ML POUR (IV SOLUTION) ×3 IMPLANT

## 2020-06-25 NOTE — Transfer of Care (Signed)
Immediate Anesthesia Transfer of Care Note  Patient: Johnathan Baldwin  Procedure(s) Performed: CYSTOSCOPY WITH BILATERAL RETROGRADE PYELOGRAM, BILATERAL URETEROSCOPY AND BILATERAL  STENT REMOVAL; STONE BASKET EXTRACTION LEFT URETERAL CALCULUS (Bilateral )  Patient Location: PACU  Anesthesia Type:General  Level of Consciousness: awake, alert , oriented and patient cooperative  Airway & Oxygen Therapy: Patient Spontanous Breathing  Post-op Assessment: Report given to RN, Post -op Vital signs reviewed and stable and Patient moving all extremities  Post vital signs: Reviewed and stable  Last Vitals:  Vitals Value Taken Time  BP 167/103 06/25/20 1331  Temp    Pulse 96 06/25/20 1332  Resp 13 06/25/20 1332  SpO2 100 % 06/25/20 1332  Vitals shown include unvalidated device data.  Last Pain:  Vitals:   06/25/20 1046  TempSrc: Oral  PainSc: 0-No pain      Patients Stated Pain Goal: 5 (06/25/20 1046)  Complications: No complications documented.

## 2020-06-25 NOTE — Discharge Instructions (Signed)

## 2020-06-25 NOTE — Anesthesia Procedure Notes (Signed)
Procedure Name: LMA Insertion Performed by: Brynda Peon, CRNA Pre-anesthesia Checklist: Patient identified, Emergency Drugs available, Suction available, Patient being monitored and Timeout performed Patient Re-evaluated:Patient Re-evaluated prior to induction Oxygen Delivery Method: Circle system utilized Preoxygenation: Pre-oxygenation with 100% oxygen Induction Type: IV induction LMA: LMA inserted LMA Size: 4.0 Number of attempts: 1 Placement Confirmation: breath sounds checked- equal and bilateral,  CO2 detector and positive ETCO2 Tube secured with: Tape Dental Injury: Teeth and Oropharynx as per pre-operative assessment  Comments: Very small mouth opening.

## 2020-06-25 NOTE — H&P (Signed)
Urology Admission H&P  Chief Complaint: bilaterla flank pain  History of Present Illness: Johnathan Baldwin is a 64yo with a hx of bilateral ureteral calculi who underwent bilateral stent placement 1 month ago. He has intermittent bialterla flank pain with stent in place. creatinien normal  Past Medical History:  Diagnosis Date  . Chronic abdominal pain S/P ABD. SURG FROM INJURY  . GERD (gastroesophageal reflux disease)   . Heart murmur MILD- ASYMPTOMATIC  . History of kidney stones   . Hypertension   . Left ureteral calculus   . Lung nodule 03/2012   CT scan  RIGHT MIDDLE LOBE- BENIGN   Past Surgical History:  Procedure Laterality Date  . ABDOMINAL SURGERY AND COLOSTOMY  2000   WORK INJURY FROM MACHINE  . CYSTOSCOPY W/ URETERAL STENT PLACEMENT Bilateral 05/17/2020   Procedure: CYSTOSCOPY WITH RETROGRADE PYELOGRAM/URETERAL STENT PLACEMENT;  Surgeon: Malen Gauze, MD;  Location: [facility];  Service: Urology;  Laterality: Bilateral;  . URETEROSCOPY  July 2013   W/ stent, s/p removal for left stone    Home Medications:  Current Facility-Administered Medications  Medication Dose Route Frequency Provider Last Rate Last Admin  . 0.9 %  sodium chloride infusion   Intravenous Continuous Roselie Skinner C, MD 150 mL/hr at 06/25/20 1145 New Bag at 06/25/20 1145  . cefTRIAXone (ROCEPHIN) 2 g in sodium chloride 0.9 % 100 mL IVPB  2 g Intravenous 30 min Pre-Op Mika Griffitts, Mardene Celeste, MD      . glycopyrrolate (ROBINUL) injection 0.2 mg  0.2 mg Intravenous Once Molli Barrows, MD       Allergies:  Allergies  Allergen Reactions  . Norvasc [Amlodipine Besylate] Swelling    Family History  Problem Relation Age of Onset  . Hypertension Mother   . Hypertension Brother   . Hypertension Sister   . Hypertension Brother    Social History:  reports that he has never smoked. He has never used smokeless tobacco. He reports that he does not drink alcohol and does not use drugs.  Review of  Systems  Genitourinary: Positive for flank pain.  All other systems reviewed and are negative.   Physical Exam:  Vital signs in last 24 hours: Temp:  [98.1 F (36.7 C)] 98.1 F (36.7 C) (07/07 1046) Pulse Rate:  [59] 59 (07/07 1046) Resp:  [14] 14 (07/07 1046) BP: (183)/(91) 183/91 (07/07 1046) SpO2:  [100 %] 100 % (07/07 1046) Physical Exam Constitutional:      Appearance: Normal appearance.  HENT:     Head: Normocephalic and atraumatic.  Eyes:     Pupils: Pupils are equal, round, and reactive to light.  Cardiovascular:     Rate and Rhythm: Normal rate and regular rhythm.  Pulmonary:     Effort: Pulmonary effort is normal. No respiratory distress.  Abdominal:     General: Abdomen is flat. There is no distension.  Musculoskeletal:        General: Normal range of motion.     Cervical back: Normal range of motion and neck supple.  Skin:    General: Skin is warm and dry.  Neurological:     General: No focal deficit present.     Mental Status: He is alert and oriented to person, place, and time.  Psychiatric:        Mood and Affect: Mood normal.        Behavior: Behavior normal.        Thought Content: Thought content normal.  Judgment: Judgment normal.     Laboratory Data:  No results found for this or any previous visit (from the past 24 hour(s)). Recent Results (from the past 240 hour(s))  SARS CORONAVIRUS 2 (TAT 6-24 HRS) Nasopharyngeal Nasopharyngeal Swab     Status: None   Collection Time: 06/24/20  8:35 AM   Specimen: Nasopharyngeal Swab  Result Value Ref Range Status   SARS Coronavirus 2 NEGATIVE NEGATIVE Final    Comment: (NOTE) SARS-CoV-2 target nucleic acids are NOT DETECTED.  The SARS-CoV-2 RNA is generally detectable in upper and lower respiratory specimens during the acute phase of infection. Negative results do not preclude SARS-CoV-2 infection, do not rule out co-infections with other pathogens, and should not be used as the sole basis for  treatment or other patient management decisions. Negative results must be combined with clinical observations, patient history, and epidemiological information. The expected result is Negative.  Fact Sheet for Patients: HairSlick.no  Fact Sheet for Healthcare Providers: quierodirigir.com  This test is not yet approved or cleared by the [place] FDA and  has been authorized for detection and/or diagnosis of SARS-CoV-2 by FDA under an Emergency Use Authorization (EUA). This EUA will remain  in effect (meaning this test can be used) for the duration of the COVID-19 declaration under Se ction 564(b)(1) of the Act, 21 U.S.C. section 360bbb-3(b)(1), unless the authorization is terminated or revoked sooner.  Performed at [facility], [address], [place], [place] [address]    Creatinine: No results for input(s): CREATININE in the last 168 hours. Baseline Creatinine: unknown  Impression/Assessment:  63yo with bilateral ureteral calculi  Plan:  The risks/benefits/alternaitves to bilateral ureteroscopic stone extraction was explained to the patient and he understands and wishes to proceed with surgery  Johnathan Baldwin 06/25/2020, 12:41 PM

## 2020-06-25 NOTE — Anesthesia Postprocedure Evaluation (Signed)
Anesthesia Post Note  Patient: Johnathan Baldwin  Procedure(s) Performed: CYSTOSCOPY WITH BILATERAL RETROGRADE PYELOGRAM, BILATERAL URETEROSCOPY AND BILATERAL  STENT REMOVAL; STONE BASKET EXTRACTION LEFT URETERAL CALCULUS (Bilateral )  Patient location during evaluation: PACU Anesthesia Type: General Level of consciousness: awake, oriented, awake and alert and patient cooperative Pain management: pain level controlled Vital Signs Assessment: post-procedure vital signs reviewed and stable Respiratory status: spontaneous breathing, respiratory function stable and nonlabored ventilation Cardiovascular status: blood pressure returned to baseline and stable Postop Assessment: no headache and no backache Anesthetic complications: no   No complications documented.   Last Vitals:  Vitals:   06/25/20 1046  BP: (!) 183/91  Pulse: (!) 59  Resp: 14  Temp: 36.7 C  SpO2: 100%    Last Pain:  Vitals:   06/25/20 1046  TempSrc: Oral  PainSc: 0-No pain                 Brynda Peon

## 2020-06-25 NOTE — Op Note (Addendum)
.  Preoperative diagnosis: bilateral ureteral stone  Postoperative diagnosis: Same  Procedure: 1 cystoscopy 2. Bilateral retrograde pyelography 3.  Intraoperative fluoroscopy, under one hour, with interpretation 4.  left ureteroscopic stone manipulation with basket extraction 5. Right diagnostic ureteroscopy 6.  bilateral 6 x 26 JJ stent removal  Attending: Cleda Mccreedy  Anesthesia: General  Estimated blood loss: None  Drains: none  Specimens: left ureteral calculus  Antibiotics: ancef  Findings: left mid ureteral calculus. No right ureteral calculus visualized. No hydronephrosis bilaterally. No masses/lesions in the bladder. Ureteral orifices in normal anatomic location.  Indications: Patient is a 64 year old male/male with a history of bilateral distal ureteral stone and elevated creatinine. He underwent bilateral ureteral stent placement 4 weeks ago. After discussing treatment options, they decided proceed with bilateral ureteroscopic stone manipulation.  Procedure her in detail: The patient was brought to the operating room and a brief timeout was done to ensure correct patient, correct procedure, correct site.  General anesthesia was administered patient was placed in dorsal lithotomy position.  Her genitalia was then prepped and draped in usual sterile fashion.  A rigid 22 French cystoscope was passed in the urethra and the bladder.  Bladder was inspected free masses or lesions.  the ureteral orifices were in the normal orthotopic locations. Using a grasper the left ureteral stent was brought to the urethral meatus. Through the stent a zipwire was advanced up to the renal pelvis. The stent was then removed. a 6 french ureteral catheter was then instilled into the left ureteral orifice.  a gentle retrograde was obtained and findings noted above.  we then removed the cystoscope and cannulated the left ureteral orifice with a semirigid ureteroscope.  We located the stone in the mid  ureter and removed it with an Ngage basket. We elected not to place a stent because this was an uncomplicated ureteroscopy. We then turned out attention to the right side. Using a grasper the ureteral stent was brought to the urethral meatus. Through the stent a zipwire was advanced up to the renal pelvis. The stent was then removed. a 6 french ureteral catheter was then instilled into the right ureteral orifice.  a gentle retrograde was obtained and findings noted above.  we then removed the cystoscope and cannulated the left ureteral orifice with a semirigid ureteroscope.  We located no stone in the ureter. We elected not to place a stent because this was an uncomplicated ureteroscopy.  the bladder was then drained and this concluded the procedure which was well tolerated by patient.  Complications: None  Condition: Stable, extubated, transferred to PACU  Plan: Patient is to be discharged home as to follow-up in 2 weeks

## 2020-06-25 NOTE — Anesthesia Preprocedure Evaluation (Addendum)
Anesthesia Evaluation  Patient identified by MRN, date of birth, ID band Patient awake    Reviewed: Allergy & Precautions, NPO status , Patient's Chart, lab work & pertinent test results, reviewed documented beta blocker date and time   History of Anesthesia Complications Negative for: history of anesthetic complications  Airway Mallampati: IV  TM Distance: >3 FB Neck ROM: Full  Mouth opening: Limited Mouth Opening  Dental  (+) Missing, Dental Advisory Given   Pulmonary  H/o tracheostomy    Pulmonary exam normal breath sounds clear to auscultation       Cardiovascular Exercise Tolerance: Good hypertension, Pt. on medications and Pt. on home beta blockers Normal cardiovascular exam+ Valvular Problems/Murmurs  Rhythm:Regular Rate:Normal  16-May-2020 13:36:11 [facility]-ED ROUTINE RECORD Sinus bradycardia Biatrial enlargement Left ventricular hypertrophy ( Sokolow-Lyon , Cornell product , Romhilt-Estes ) T wave abnormality, consider inferior ischemia Abnormal ECG No previous tracing Confirmed by Linwood Dibbles [phone]) on 05/16/2020 5:04:49 PM   Neuro/Psych negative neurological ROS  negative psych ROS   GI/Hepatic GERD  Medicated and Controlled,Colostomy    Endo/Other    Renal/GU Renal Insufficiency and CRFRenal disease     Musculoskeletal Chronic pain - on fentanyl patch   Abdominal   Peds  Hematology   Anesthesia Other Findings Chronic pain - on fentanyl patch  Reproductive/Obstetrics                           Anesthesia Physical Anesthesia Plan  ASA: III  Anesthesia Plan: General   Post-op Pain Management:    Induction: Intravenous  PONV Risk Score and Plan: 3 and Ondansetron, Dexamethasone and Midazolam  Airway Management Planned: LMA  Additional Equipment:   Intra-op Plan:   Post-operative Plan: Extubation in OR  Informed Consent: I have reviewed the  patients History and Physical, chart, labs and discussed the procedure including the risks, benefits and alternatives for the proposed anesthesia with the patient or authorized representative who has indicated his/her understanding and acceptance.     Dental advisory given  Plan Discussed with: CRNA and Surgeon  Anesthesia Plan Comments:         Anesthesia Quick Evaluation

## 2020-06-27 ENCOUNTER — Encounter (HOSPITAL_COMMUNITY): Payer: Self-pay | Admitting: Urology

## 2020-07-02 ENCOUNTER — Ambulatory Visit: Payer: 59 | Admitting: Urology

## 2020-07-02 ENCOUNTER — Ambulatory Visit: Payer: 59 | Admitting: Rheumatology

## 2020-07-04 ENCOUNTER — Ambulatory Visit: Payer: 59 | Admitting: Rheumatology

## 2020-07-04 LAB — CALCULI, WITH PHOTOGRAPH (CLINICAL LAB)
Calcium Oxalate Monohydrate: 10 %
Hydroxyapatite: 90 %
Weight Calculi: 49 mg

## 2020-07-17 ENCOUNTER — Other Ambulatory Visit: Payer: Self-pay | Admitting: *Deleted

## 2020-07-17 NOTE — Telephone Encounter (Signed)
Ok to refill??  Last office visit 06/02/2020.  Last refill 06/20/2020.

## 2020-07-18 MED ORDER — FENTANYL 25 MCG/HR TD PT72: 1.0000 | MEDICATED_PATCH | TRANSDERMAL | 0 refills | Status: DC

## 2020-07-28 ENCOUNTER — Other Ambulatory Visit: Payer: Self-pay | Admitting: Family Medicine

## 2020-08-19 NOTE — Progress Notes (Signed)
Office Visit Note  Patient: Johnathan Baldwin             Date of Birth: [birth year]           MRN: [id number]             PCP: Salley Scarlet, MD Referring: Salley Scarlet, MD Visit Date: 08/26/2020 Occupation: @GUAROCC @  Subjective:  Abnormal labs.   History of Present Illness: Johnathan Baldwin is a 64 y.o. male was seen initially per request of his nephrologist for evaluation of positive ANA and double-stranded DNA. His labs showed low titer ANA and double-stranded DNA but no clinical features of autoimmune disease. He denies any history of oral ulcers, nasal ulcers, malar rash, photosensitivity, Raynaud's phenomenon, lymphadenopathy, inflammatory arthritis. There is no family history of autoimmune disease.  Activities of Daily Living:  Patient reports morning stiffness for 0 minutes.   Patient Denies nocturnal pain.  Difficulty dressing/grooming: Denies Difficulty climbing stairs: Denies Difficulty getting out of chair: Denies Difficulty using hands for taps, buttons, cutlery, and/or writing: Denies  Review of Systems  Constitutional: Negative for fatigue.  HENT: Negative for mouth sores, mouth dryness and nose dryness.   Eyes: Negative for itching and dryness.  Respiratory: Negative for shortness of breath and difficulty breathing.   Cardiovascular: Negative for chest pain and palpitations.  Gastrointestinal: Negative for blood in stool, constipation and diarrhea.  Endocrine: Negative for increased urination.  Genitourinary: Negative for difficulty urinating.  Musculoskeletal: Negative for arthralgias, joint pain, joint swelling, myalgias, morning stiffness, muscle tenderness and myalgias.  Skin: Positive for color change. Negative for rash and redness.  Allergic/Immunologic: Negative for susceptible to infections.  Neurological: Positive for numbness. Negative for dizziness, headaches, memory loss and weakness.  Hematological: Negative for bruising/bleeding tendency.    Psychiatric/Behavioral: Negative for confusion and sleep disturbance.    PMFS History:  Patient Active Problem List   Diagnosis Date Noted  . CKD (chronic kidney disease) stage 3, GFR 30-59 ml/min 06/02/2020  . Bilateral ureteral obstruction 05/17/2020  . S/P colostomy (HCC) 05/29/2019  . GERD (gastroesophageal reflux disease) 05/29/2019  . Chronic shoulder pain 12/08/2015  . Idiopathic proctitis 07/23/2013  . Colon cancer screening 03/23/2013  . Lung nodule 03/23/2013  . HTN (hypertension), benign 09/02/2011  . Kidney stones 09/02/2011  . Chronic abdominal pain 09/02/2011    Past Medical History:  Diagnosis Date  . Chronic abdominal pain S/P ABD. SURG FROM INJURY  . GERD (gastroesophageal reflux disease)   . Heart murmur MILD- ASYMPTOMATIC  . History of kidney stones   . Hypertension   . Left ureteral calculus   . Lung nodule 03/2012   CT scan  RIGHT MIDDLE LOBE- BENIGN    Family History  Problem Relation Age of Onset  . Hypertension Mother   . Hypertension Brother   . Hypertension Sister   . Hypertension Brother    Past Surgical History:  Procedure Laterality Date  . ABDOMINAL SURGERY AND COLOSTOMY  2000   WORK INJURY FROM MACHINE  . CYSTOSCOPY W/ URETERAL STENT PLACEMENT Bilateral 05/17/2020   Procedure: CYSTOSCOPY WITH RETROGRADE PYELOGRAM/URETERAL STENT PLACEMENT;  Surgeon: 05/19/2020, MD;  Location: [facility];  Service: Urology;  Laterality: Bilateral;  . CYSTOSCOPY WITH RETROGRADE PYELOGRAM, URETEROSCOPY AND STENT PLACEMENT Bilateral 06/25/2020   Procedure: CYSTOSCOPY WITH BILATERAL RETROGRADE PYELOGRAM, BILATERAL URETEROSCOPY AND BILATERAL  STENT REMOVAL; STONE BASKET EXTRACTION LEFT URETERAL CALCULUS;  Surgeon: 08/26/2020, MD;  Location: [facility];  Service: Urology;  Laterality: Bilateral;  .  URETEROSCOPY  July 2013   W/ stent, s/p removal for left stone   Social History   Social History Narrative  . Not on file   Immunization History   Administered Date(s) Administered  . Influenza,inj,Quad PF,6+ Mos 11/30/2019  . Influenza-Unspecified 10/20/2018  . Moderna SARS-COVID-2 Vaccination 03/04/2020, 04/15/2020  . Tdap 02/07/2012  . Zoster Recombinat (Shingrix) 05/11/2017, 08/24/2017     Objective: Vital Signs: BP (!) 194/92 (BP Location: Left Arm, Patient Position: Sitting, Cuff Size: Normal)   Pulse 60   Resp 15   Ht 5\' 9"  (1.753 m)   Wt 150 lb 6.4 oz (68.2 kg)   BMI 22.21 kg/m    Physical Exam Vitals and nursing note reviewed.  Constitutional:      Appearance: He is well-developed.  HENT:     Head: Normocephalic and atraumatic.  Eyes:     Conjunctiva/sclera: Conjunctivae normal.     Pupils: Pupils are equal, round, and reactive to light.  Cardiovascular:     Rate and Rhythm: Normal rate and regular rhythm.     Heart sounds: Normal heart sounds.  Pulmonary:     Effort: Pulmonary effort is normal.     Breath sounds: Normal breath sounds.  Abdominal:     General: Bowel sounds are normal.     Palpations: Abdomen is soft.  Musculoskeletal:     Cervical back: Normal range of motion and neck supple.  Skin:    General: Skin is warm and dry.     Capillary Refill: Capillary refill takes less than 2 seconds.  Neurological:     Mental Status: He is alert and oriented to person, place, and time.  Psychiatric:        Behavior: Behavior normal.      Musculoskeletal Exam: C-spine, thoracic and lumbar spine were in good range of motion. Shoulder joints, elbow joints, wrist joints, MCPs PIPs and DIPs with good range of motion with no synovitis. Hip joints, knee joints, ankles, MTPs and PIPs with good range of motion with no synovitis.  CDAI Exam: CDAI Score: -- Patient Global: --; Provider Global: -- Swollen: --; Tender: -- Joint Exam 08/26/2020   No joint exam has been documented for this visit   There is currently no information documented on the homunculus. Go to the Rheumatology activity and complete the  homunculus joint exam.  Investigation: No additional findings.  Imaging: No results found.  Recent Labs: Lab Results  Component Value Date   WBC 4.8 06/02/2020   HGB 10.9 (L) 06/02/2020   PLT 499 (H) 06/02/2020   NA 141 06/02/2020   K 5.1 06/02/2020   CL 108 06/02/2020   CO2 27 06/02/2020   GLUCOSE 92 06/02/2020   BUN 22 06/02/2020   CREATININE 2.00 (H) 06/02/2020   BILITOT 0.5 06/02/2020   ALKPHOS 60 05/17/2020   AST 22 06/02/2020   ALT 20 06/02/2020   PROT 6.6 06/02/2020   ALBUMIN 3.6 05/18/2020   CALCIUM 8.8 06/02/2020   GFRAA 41 (L) 06/02/2020   June 06, 2020 lupus anticoagulant negative, beta-2 negative, anticardiolipin negative, ANA 1: 40 speckled, ENA negative, double-stranded DNA 14, uric acid 6.5 Speciality Comments: No specialty comments available.  Procedures:  No procedures performed Allergies: Norvasc [amlodipine besylate]   Assessment / Plan:     Visit Diagnoses: Positive ANA (antinuclear antibody) - Repeat ANA negative, double-stranded DNA 14.  No clinical features of autoimmune disease. I detailed discussion with the patient regarding his labs. He has no clinical features of lupus. If he develops any new symptoms he should notify me. He denies any history of oral ulcers, nasal ulcers, malar rash, sicca symptoms, Raynaud's phenomenon,  inflammatory arthritis or lymphadenopathy. I have advised him to contact me in case he develops any new symptoms. I will obtainAVISE labs prior to his next appointment.  HTN (hypertension), benign-his blood pressures are still very elevated today. Have advised him to monitor his blood pressure closely and follow-up with his PCP.  Lung nodule - Right middle lobe benign nodule per report on the CT scan.  Calculus of gallbladder without cholecystitis without obstruction  S/P colostomy (HCC) - History of abdominal injury at work.  Gastroesophageal reflux disease without esophagitis  Stage 3b chronic kidney disease-followed by Dr. Wolfgang Phoenix. I would recommend renal biopsy if there is a concern about lupus nephritis. It is difficult to establish that he has lupus based on his labs. His complements are normal and titers are low.  Bilateral ureteral obstruction  Kidney stones  Idiopathic proctitis  Vitamin D deficiency  Orders: No orders of the defined types were placed in this encounter.  No orders of the defined types were placed in this encounter.   Follow-Up Instructions: Return in about 6 months (around 02/23/2021) for +ANA,+dsDNA.   Pollyann Savoy, MD  Note - This record has been created using Animal nutritionist.  Chart creation errors have been sought, but may not always  have been located. Such creation errors do not reflect on  the standard of medical care.

## 2020-08-20 ENCOUNTER — Other Ambulatory Visit: Payer: Self-pay | Admitting: *Deleted

## 2020-08-20 MED ORDER — FENTANYL 25 MCG/HR TD PT72: 1.0000 | MEDICATED_PATCH | TRANSDERMAL | 0 refills | Status: DC

## 2020-08-20 NOTE — Telephone Encounter (Signed)
Received call from patient.   Requested refill on Fentanyl.   Ok to refill??  Last office visit 06/02/2020.  Last refill 07/18/2020.

## 2020-08-26 ENCOUNTER — Encounter: Payer: Self-pay | Admitting: Rheumatology

## 2020-08-26 ENCOUNTER — Other Ambulatory Visit: Payer: Self-pay

## 2020-08-26 ENCOUNTER — Ambulatory Visit (INDEPENDENT_AMBULATORY_CARE_PROVIDER_SITE_OTHER): Payer: 59 | Admitting: Rheumatology

## 2020-08-26 VITALS — BP 194/92 | HR 60 | Resp 15 | Ht 69.0 in | Wt 150.4 lb

## 2020-08-26 DIAGNOSIS — R911 Solitary pulmonary nodule: Secondary | ICD-10-CM

## 2020-08-26 DIAGNOSIS — R768 Other specified abnormal immunological findings in serum: Secondary | ICD-10-CM | POA: Diagnosis not present

## 2020-08-26 DIAGNOSIS — K8020 Calculus of gallbladder without cholecystitis without obstruction: Secondary | ICD-10-CM | POA: Diagnosis not present

## 2020-08-26 DIAGNOSIS — K6289 Other specified diseases of anus and rectum: Secondary | ICD-10-CM

## 2020-08-26 DIAGNOSIS — N200 Calculus of kidney: Secondary | ICD-10-CM

## 2020-08-26 DIAGNOSIS — I10 Essential (primary) hypertension: Secondary | ICD-10-CM

## 2020-08-26 DIAGNOSIS — Z933 Colostomy status: Secondary | ICD-10-CM

## 2020-08-26 DIAGNOSIS — K219 Gastro-esophageal reflux disease without esophagitis: Secondary | ICD-10-CM

## 2020-08-26 DIAGNOSIS — N1832 Chronic kidney disease, stage 3b: Secondary | ICD-10-CM

## 2020-08-26 DIAGNOSIS — E559 Vitamin D deficiency, unspecified: Secondary | ICD-10-CM

## 2020-08-26 DIAGNOSIS — N135 Crossing vessel and stricture of ureter without hydronephrosis: Secondary | ICD-10-CM

## 2020-08-26 NOTE — Patient Instructions (Signed)
Please continue to wear mask, practice social distancing and hand hygiene. He will need a COVID-19 booster 8 months after his second dose of COVID-19.

## 2020-09-18 ENCOUNTER — Other Ambulatory Visit: Payer: Self-pay | Admitting: *Deleted

## 2020-09-18 NOTE — Telephone Encounter (Signed)
Received call from patient.   Requested refill on Fentanyl.   Ok to refill??  Last office visit 06/02/2020.  Last refill 08/20/2020.

## 2020-09-19 ENCOUNTER — Other Ambulatory Visit: Payer: Self-pay | Admitting: Family Medicine

## 2020-09-19 MED ORDER — FENTANYL 25 MCG/HR TD PT72: 1.0000 | MEDICATED_PATCH | TRANSDERMAL | 0 refills | Status: DC

## 2020-09-19 NOTE — Telephone Encounter (Signed)
Please deny as duplicate

## 2020-10-21 ENCOUNTER — Other Ambulatory Visit: Payer: Self-pay | Admitting: *Deleted

## 2020-10-21 MED ORDER — FENTANYL 25 MCG/HR TD PT72: 1.0000 | MEDICATED_PATCH | TRANSDERMAL | 0 refills | Status: DC

## 2020-10-21 NOTE — Telephone Encounter (Signed)
Received call from patient.   Requested refill on Fentanyl.   Ok to refill??  Last office visit 06/02/2020.  Last refill 09/19/2020.

## 2020-11-03 ENCOUNTER — Encounter: Payer: Self-pay | Admitting: Family Medicine

## 2020-11-03 ENCOUNTER — Ambulatory Visit (INDEPENDENT_AMBULATORY_CARE_PROVIDER_SITE_OTHER): Payer: 59 | Admitting: Family Medicine

## 2020-11-03 ENCOUNTER — Other Ambulatory Visit: Payer: Self-pay

## 2020-11-03 VITALS — BP 148/82 | HR 64 | Temp 98.9°F | Resp 14 | Ht 69.0 in | Wt 153.0 lb

## 2020-11-03 DIAGNOSIS — Z933 Colostomy status: Secondary | ICD-10-CM | POA: Diagnosis not present

## 2020-11-03 DIAGNOSIS — N1832 Chronic kidney disease, stage 3b: Secondary | ICD-10-CM

## 2020-11-03 DIAGNOSIS — G8929 Other chronic pain: Secondary | ICD-10-CM

## 2020-11-03 DIAGNOSIS — Z23 Encounter for immunization: Secondary | ICD-10-CM

## 2020-11-03 DIAGNOSIS — K219 Gastro-esophageal reflux disease without esophagitis: Secondary | ICD-10-CM

## 2020-11-03 DIAGNOSIS — I10 Essential (primary) hypertension: Secondary | ICD-10-CM

## 2020-11-03 DIAGNOSIS — R109 Unspecified abdominal pain: Secondary | ICD-10-CM

## 2020-11-03 MED ORDER — FAMOTIDINE 40 MG/5ML PO SUSR: ORAL | 6 refills | Status: AC

## 2020-11-03 MED ORDER — VITAMIN D (ERGOCALCIFEROL) 1.25 MG (50000 UNIT) PO CAPS: 50000.0000 [IU] | ORAL_CAPSULE | ORAL | Status: AC

## 2020-11-03 MED ORDER — CARVEDILOL 12.5 MG PO TABS
12.5000 mg | ORAL_TABLET | Freq: Two times a day (BID) | ORAL | 1 refills | Status: AC
Start: 2020-11-03 — End: ?

## 2020-11-03 MED ORDER — CHLORTHALIDONE 25 MG PO TABS: 25.0000 mg | ORAL_TABLET | Freq: Every day | ORAL | 2 refills | Status: AC

## 2020-11-03 MED ORDER — HYDRALAZINE HCL 100 MG PO TABS: 100.0000 mg | ORAL_TABLET | Freq: Two times a day (BID) | ORAL | 1 refills | Status: AC

## 2020-11-03 NOTE — Assessment & Plan Note (Signed)
I called the pharmacy to verify his medications.  For now he will take hydralazine 100 mg twice a day along with chlorthalidone 25 mg once a day and he will start carvedilol 12.5 mg twice a day  Has follow-up scheduled with his nephrologist

## 2020-11-03 NOTE — Assessment & Plan Note (Signed)
Continue ranitidine

## 2020-11-03 NOTE — Progress Notes (Signed)
° °  Subjective:    Patient ID: Johnathan Baldwin, male    DOB: Feb 01, 1956, 64 y.o.   MRN: [id number]  Patient presents for Follow-up (is not fasting)  Patient here to follow-up on medical problems.  The last visit he was admitted again for kidney stones causing bilateral obstruction.  He had worsening kidney function secondary to this.  He had stents placed which has helped.  He is now on potassium citrate by nephrology.  There was no changes made to his blood pressure medications however he was not aware of this.  I reviewed the last nephrology note due to some compliance issues with hydralazine this was changed to 200 mg twice a day however his prescription bottle is 400 mg twice a day he also continues to take 25 mg twice a day of the other hydralazine depression.  He ran out of metoprolol last Friday he was opposed we switched to carvedilol 12.5 mg twice a day.  He is still taking the chlorthalidone.  He is also using his fentanyl patches as prescribed in his chronic H2 blocker ranitidine  He does not have any new concerns today.   Review Of Systems:  GEN- denies fatigue, fever, weight loss,weakness, recent illness HEENT- denies eye drainage, change in vision, nasal discharge, CVS- denies chest pain, palpitations RESP- denies SOB, cough, wheeze ABD- denies N/V, change in stools, abd pain GU- denies dysuria, hematuria, dribbling, incontinence MSK- denies joint pain, muscle aches, injury Neuro- denies headache, dizziness, syncope, seizure activity       Objective:    BP (!) 148/82    Pulse 64    Temp 98.9 F (37.2 C) (Temporal)    Resp 14    Ht 5\' 9"  (1.753 m)    Wt 153 lb (69.4 kg)    SpO2 99%    BMI 22.59 kg/m  GEN- NAD, alert and oriented x3 HEENT- PERRL, EOMI, non injected sclera, pink conjunctiva, MMM, oropharynx clear Neck- Supple, no thyromegaly CVS- RRR, no murmur RESP-CTAB ABD-NABS,soft,NT,ND, colostomy in tact, no CVA tenderness EXT- No edema Pulses- Radial,2+       Assessment & Plan:      Problem List Items Addressed This Visit      Unprioritized   Chronic abdominal pain    Continue fentanyl patch      CKD (chronic kidney disease) stage 3, GFR 30-59 ml/min (HCC)   GERD (gastroesophageal reflux disease)    Continue ranitidine      Relevant Medications   famotidine (PEPCID) 40 MG/5ML suspension   HTN (hypertension), benign - Primary    I called the pharmacy to verify his medications.  For now he will take hydralazine 100 mg twice a day along with chlorthalidone 25 mg once a day and he will start carvedilol 12.5 mg twice a day  Has follow-up scheduled with his nephrologist      Relevant Medications   chlorthalidone (HYGROTON) 25 MG tablet   hydrALAZINE (APRESOLINE) 100 MG tablet   carvedilol (COREG) 12.5 MG tablet   S/P colostomy (HCC)    Other Visit Diagnoses    Need for immunization against influenza       Relevant Orders   Flu Vaccine QUAD 36+ mos IM (Completed)      Note: This dictation was prepared with Dragon dictation along with smaller phrase technology. Any transcriptional errors that result from this process are unintentional.

## 2020-11-03 NOTE — Assessment & Plan Note (Signed)
-  Continue fentanyl patch 

## 2020-11-03 NOTE — Patient Instructions (Addendum)
Flu shot given You hydralazine take 100mg  twice a day Chlorthalidone 25mg  once a day New medication Carvediolol (Coreg) 12.5mg  twice a day  For blood pressure < 130/80  F/U June for Physical

## 2020-11-18 ENCOUNTER — Other Ambulatory Visit: Payer: Self-pay | Admitting: *Deleted

## 2020-11-18 MED ORDER — FENTANYL 25 MCG/HR TD PT72: 1.0000 | MEDICATED_PATCH | TRANSDERMAL | 0 refills | Status: DC

## 2020-11-18 NOTE — Telephone Encounter (Signed)
Received call from patient.   Requested refill on Fentanyl.   Ok to refill??  Last office visit 11/03/2020.  Last refill 10/21/2020.

## 2020-11-20 ENCOUNTER — Other Ambulatory Visit: Payer: Self-pay

## 2020-11-20 ENCOUNTER — Other Ambulatory Visit: Payer: 59

## 2020-11-20 DIAGNOSIS — Z20822 Contact with and (suspected) exposure to covid-19: Secondary | ICD-10-CM

## 2020-11-21 LAB — SARS-COV-2, NAA 2 DAY TAT

## 2020-11-21 LAB — NOVEL CORONAVIRUS, NAA: SARS-CoV-2, NAA: NOT DETECTED

## 2020-12-22 ENCOUNTER — Other Ambulatory Visit: Payer: Self-pay | Admitting: *Deleted

## 2020-12-22 NOTE — Telephone Encounter (Signed)
Received call from patient.   Requested refill on Fentanyl Patches.   Ok to refill??  Last office visit 11/03/2020.  Last refill 11/18/2020.

## 2020-12-23 MED ORDER — FENTANYL 25 MCG/HR TD PT72: 1.0000 | MEDICATED_PATCH | TRANSDERMAL | 0 refills | Status: DC

## 2021-01-20 ENCOUNTER — Other Ambulatory Visit: Payer: Self-pay

## 2021-01-20 MED ORDER — FENTANYL 25 MCG/HR TD PT72: 1.0000 | MEDICATED_PATCH | TRANSDERMAL | 0 refills | Status: DC

## 2021-02-03 ENCOUNTER — Other Ambulatory Visit: Payer: Self-pay | Admitting: Family Medicine

## 2021-02-10 NOTE — Progress Notes (Deleted)
Office Visit Note  Patient: Johnathan Baldwin             Date of Birth: 03-18-1956           MRN: [id number]             PCP: Salley Scarlet, MD Referring: Salley Scarlet, MD Visit Date: 02/24/2021 Occupation: @GUAROCC @  Subjective:  No chief complaint on file.   History of Present Illness: Johnathan Baldwin is a 65 y.o. male ***   Activities of Daily Living:  Patient reports morning stiffness for *** {minute/hour:19697}.   Patient {ACTIONS;DENIES/REPORTS:21021675::"Denies"} nocturnal pain.  Difficulty dressing/grooming: {ACTIONS;DENIES/REPORTS:21021675::"Denies"} Difficulty climbing stairs: {ACTIONS;DENIES/REPORTS:21021675::"Denies"} Difficulty getting out of chair: {ACTIONS;DENIES/REPORTS:21021675::"Denies"} Difficulty using hands for taps, buttons, cutlery, and/or writing: {ACTIONS;DENIES/REPORTS:21021675::"Denies"}  No Rheumatology ROS completed.   PMFS History:  Patient Active Problem List   Diagnosis Date Noted  . CKD (chronic kidney disease) stage 3, GFR 30-59 ml/min (HCC) 06/02/2020  . Bilateral ureteral obstruction 05/17/2020  . S/P colostomy (HCC) 05/29/2019  . GERD (gastroesophageal reflux disease) 05/29/2019  . Chronic shoulder pain 12/08/2015  . Idiopathic proctitis 07/23/2013  . Colon cancer screening 03/23/2013  . Lung nodule 03/23/2013  . HTN (hypertension), benign 09/02/2011  . Kidney stones 09/02/2011  . Chronic abdominal pain 09/02/2011    Past Medical History:  Diagnosis Date  . Chronic abdominal pain S/P ABD. SURG FROM INJURY  . GERD (gastroesophageal reflux disease)   . Heart murmur MILD- ASYMPTOMATIC  . History of kidney stones   . Hypertension   . Left ureteral calculus   . Lung nodule 03/2012   CT scan  RIGHT MIDDLE LOBE- BENIGN    Family History  Problem Relation Age of Onset  . Hypertension Mother   . Hypertension Brother   . Hypertension Sister   . Hypertension Brother    Past Surgical History:  Procedure Laterality Date  .  ABDOMINAL SURGERY AND COLOSTOMY  2000   WORK INJURY FROM MACHINE  . CYSTOSCOPY W/ URETERAL STENT PLACEMENT Bilateral 05/17/2020   Procedure: CYSTOSCOPY WITH RETROGRADE PYELOGRAM/URETERAL STENT PLACEMENT;  Surgeon: 05/19/2020, MD;  Location: [facility];  Service: Urology;  Laterality: Bilateral;  . CYSTOSCOPY WITH RETROGRADE PYELOGRAM, URETEROSCOPY AND STENT PLACEMENT Bilateral 06/25/2020   Procedure: CYSTOSCOPY WITH BILATERAL RETROGRADE PYELOGRAM, BILATERAL URETEROSCOPY AND BILATERAL  STENT REMOVAL; STONE BASKET EXTRACTION LEFT URETERAL CALCULUS;  Surgeon: 08/26/2020, MD;  Location: [facility];  Service: Urology;  Laterality: Bilateral;  . URETEROSCOPY  July 2013   W/ stent, s/p removal for left stone   Social History   Social History Narrative  . Not on file   Immunization History  Administered Date(s) Administered  . Influenza,inj,Quad PF,6+ Mos 11/30/2019, 11/03/2020  . Influenza-Unspecified 10/20/2018  . Moderna Sars-Covid-2 Vaccination 03/04/2020, 04/15/2020  . Tdap 02/07/2012  . Zoster Recombinat (Shingrix) 05/11/2017, 08/24/2017     Objective: Vital Signs: There were no vitals taken for this visit.   Physical Exam   Musculoskeletal Exam: ***  CDAI Exam: CDAI Score: -- Patient Global: --; Provider Global: -- Swollen: --; Tender: -- Joint Exam 02/24/2021   No joint exam has been documented for this visit   There is currently no information documented on the homunculus. Go to the Rheumatology activity and complete the homunculus joint exam.  Investigation: No additional findings.  Imaging: No results found.  Recent Labs: Lab Results  Component Value Date   WBC 4.8 06/02/2020   HGB 10.9 (L) 06/02/2020   PLT 499 (H) 06/02/2020  NA 141 06/02/2020   K 5.1 06/02/2020   CL 108 06/02/2020   CO2 27 06/02/2020   GLUCOSE 92 06/02/2020   BUN 22 06/02/2020   CREATININE 2.00 (H) 06/02/2020   BILITOT 0.5 06/02/2020   ALKPHOS 60 05/17/2020   AST 22 06/02/2020    ALT 20 06/02/2020   PROT 6.6 06/02/2020   ALBUMIN 3.6 05/18/2020   CALCIUM 8.8 06/02/2020   GFRAA 41 (L) 06/02/2020    Speciality Comments: No specialty comments available.  Procedures:  No procedures performed Allergies: Norvasc [amlodipine besylate]   Assessment / Plan:     Visit Diagnoses: No diagnosis found.  Orders: No orders of the defined types were placed in this encounter.  No orders of the defined types were placed in this encounter.   Face-to-face time spent with patient was *** minutes. Greater than 50% of time was spent in counseling and coordination of care.  Follow-Up Instructions: No follow-ups on file.   Ellen Henri, CMA  Note - This record has been created using Animal nutritionist.  Chart creation errors have been sought, but may not always  have been located. Such creation errors do not reflect on  the standard of medical care.

## 2021-02-17 ENCOUNTER — Other Ambulatory Visit: Payer: Self-pay | Admitting: *Deleted

## 2021-02-17 MED ORDER — FENTANYL 25 MCG/HR TD PT72: 1.0000 | MEDICATED_PATCH | TRANSDERMAL | 0 refills | Status: DC

## 2021-02-17 NOTE — Telephone Encounter (Signed)
Received call from patient.   Requested refill on Duragesic.   Ok to refill??   Last office visit 11/03/2020.  Last refill 01/20/2021.

## 2021-02-24 ENCOUNTER — Telehealth: Payer: Self-pay

## 2021-02-24 ENCOUNTER — Ambulatory Visit: Payer: 59 | Admitting: Rheumatology

## 2021-02-24 DIAGNOSIS — Z933 Colostomy status: Secondary | ICD-10-CM

## 2021-02-24 DIAGNOSIS — I10 Essential (primary) hypertension: Secondary | ICD-10-CM

## 2021-02-24 DIAGNOSIS — E559 Vitamin D deficiency, unspecified: Secondary | ICD-10-CM

## 2021-02-24 DIAGNOSIS — R911 Solitary pulmonary nodule: Secondary | ICD-10-CM

## 2021-02-24 DIAGNOSIS — N1832 Chronic kidney disease, stage 3b: Secondary | ICD-10-CM

## 2021-02-24 DIAGNOSIS — K8020 Calculus of gallbladder without cholecystitis without obstruction: Secondary | ICD-10-CM

## 2021-02-24 DIAGNOSIS — K6289 Other specified diseases of anus and rectum: Secondary | ICD-10-CM

## 2021-02-24 DIAGNOSIS — N200 Calculus of kidney: Secondary | ICD-10-CM

## 2021-02-24 DIAGNOSIS — K219 Gastro-esophageal reflux disease without esophagitis: Secondary | ICD-10-CM

## 2021-02-24 DIAGNOSIS — R768 Other specified abnormal immunological findings in serum: Secondary | ICD-10-CM

## 2021-02-24 DIAGNOSIS — N135 Crossing vessel and stricture of ureter without hydronephrosis: Secondary | ICD-10-CM

## 2021-02-24 NOTE — Telephone Encounter (Signed)
Opened in error

## 2021-03-12 ENCOUNTER — Other Ambulatory Visit: Payer: Self-pay | Admitting: Family Medicine

## 2021-03-12 NOTE — Telephone Encounter (Signed)
Ok to refill??  Last office visit 11/03/2020.  Last refill 02/17/2021.

## 2021-03-12 NOTE — Telephone Encounter (Signed)
Pt called needing a refill of  fentaNYL (DURAGESIC) 25 MCG/HR  Cb#: [phone]

## 2021-03-13 MED ORDER — FENTANYL 25 MCG/HR TD PT72: 1.0000 | MEDICATED_PATCH | TRANSDERMAL | 0 refills | Status: DC

## 2021-03-27 NOTE — Progress Notes (Signed)
Office Visit Note  Patient: Johnathan Baldwin             Date of Birth: [birth year]           MRN: [id number]             PCP: No primary care provider on file. Referring: Salley Scarlet, MD Visit Date: 04/09/2021 Occupation: @GUAROCC @  Subjective:  Abnormal labs.   History of Present Illness: Johnathan Baldwin is a 65 y.o. male seen for a follow up visit.  He was initially evaluated for positive ANA and double-stranded DNA.  He states that for the last 6 months he has been noticing that when he is outside in the cold temperatures he notices discoloration of his fingers and some tingling in his hands.  There is no history of oral ulcers, nasal ulcers, malar rash, photosensitivity, lymphadenopathy or inflammatory arthritis.  He has been followed by Dr. 68 for chronic kidney disease.  Activities of Daily Living:  Patient reports morning stiffness for 0 minutes.   Patient Denies nocturnal pain.  Difficulty dressing/grooming: Denies Difficulty climbing stairs: Denies Difficulty getting out of chair: Denies Difficulty using hands for taps, buttons, cutlery, and/or writing: Denies  Review of Systems  Constitutional: Negative for fatigue and night sweats.  HENT: Negative for mouth sores, mouth dryness and nose dryness.   Eyes: Negative for pain, redness, itching and dryness.  Respiratory: Negative for shortness of breath and difficulty breathing.   Cardiovascular: Negative for chest pain, palpitations, hypertension, irregular heartbeat and swelling in legs/feet.  Gastrointestinal: Negative for blood in stool, constipation and diarrhea.  Endocrine: Negative for increased urination.  Genitourinary: Negative for difficulty urinating.  Musculoskeletal: Negative for arthralgias, joint pain, joint swelling, myalgias, muscle weakness, morning stiffness, muscle tenderness and myalgias.  Skin: Positive for color change. Negative for rash, hair loss, nodules/bumps, redness, skin tightness,  ulcers and sensitivity to sunlight.  Allergic/Immunologic: Negative for susceptible to infections.  Neurological: Positive for numbness and parasthesias. Negative for dizziness, fainting, headaches, memory loss, night sweats and weakness.  Hematological: Negative for bruising/bleeding tendency and swollen glands.  Psychiatric/Behavioral: Negative for depressed mood, confusion and sleep disturbance. The patient is not nervous/anxious.     PMFS History:  Patient Active Problem List   Diagnosis Date Noted  . CKD (chronic kidney disease) stage 3, GFR 30-59 ml/min (HCC) 06/02/2020  . Bilateral ureteral obstruction 05/17/2020  . S/P colostomy (HCC) 05/29/2019  . GERD (gastroesophageal reflux disease) 05/29/2019  . Chronic shoulder pain 12/08/2015  . Idiopathic proctitis 07/23/2013  . Colon cancer screening 03/23/2013  . Lung nodule 03/23/2013  . HTN (hypertension), benign 09/02/2011  . Kidney stones 09/02/2011  . Chronic abdominal pain 09/02/2011    Past Medical History:  Diagnosis Date  . Chronic abdominal pain S/P ABD. SURG FROM INJURY  . GERD (gastroesophageal reflux disease)   . Heart murmur MILD- ASYMPTOMATIC  . History of kidney stones   . Hypertension   . Left ureteral calculus   . Lung nodule 03/2012   CT scan  RIGHT MIDDLE LOBE- BENIGN    Family History  Problem Relation Age of Onset  . Hypertension Mother   . Hypertension Brother   . Hypertension Sister   . Hypertension Brother    Past Surgical History:  Procedure Laterality Date  . ABDOMINAL SURGERY AND COLOSTOMY  2000   WORK INJURY FROM MACHINE  . CYSTOSCOPY W/ URETERAL STENT PLACEMENT Bilateral 05/17/2020   Procedure: CYSTOSCOPY WITH RETROGRADE PYELOGRAM/URETERAL STENT PLACEMENT;  Surgeon:  McKenzie, Mardene Celeste, MD;  Location: [facility];  Service: Urology;  Laterality: Bilateral;  . CYSTOSCOPY WITH RETROGRADE PYELOGRAM, URETEROSCOPY AND STENT PLACEMENT Bilateral 06/25/2020   Procedure: CYSTOSCOPY WITH BILATERAL RETROGRADE  PYELOGRAM, BILATERAL URETEROSCOPY AND BILATERAL  STENT REMOVAL; STONE BASKET EXTRACTION LEFT URETERAL CALCULUS;  Surgeon: Malen Gauze, MD;  Location: [facility];  Service: Urology;  Laterality: Bilateral;  . URETEROSCOPY  July 2013   W/ stent, s/p removal for left stone   Social History   Social History Narrative  . Not on file   Immunization History  Administered Date(s) Administered  . Influenza,inj,Quad PF,6+ Mos 11/30/2019, 11/03/2020  . Influenza-Unspecified 10/20/2018  . Moderna Sars-Covid-2 Vaccination 03/04/2020, 04/15/2020  . Tdap 02/07/2012  . Zoster Recombinat (Shingrix) 05/11/2017, 08/24/2017     Objective: Vital Signs: BP (!) 161/83 (BP Location: Left Arm, Patient Position: Sitting, Cuff Size: Normal)   Pulse (!) 56   Resp 17   Ht 5\' 9"  (1.753 m)   Wt 151 lb 9.6 oz (68.8 kg)   BMI 22.39 kg/m    Physical Exam Vitals and nursing note reviewed.  Constitutional:      Appearance: He is well-developed.  HENT:     Head: Normocephalic and atraumatic.  Eyes:     Conjunctiva/sclera: Conjunctivae normal.     Pupils: Pupils are equal, round, and reactive to light.  Cardiovascular:     Rate and Rhythm: Normal rate and regular rhythm.     Heart sounds: Normal heart sounds.  Pulmonary:     Effort: Pulmonary effort is normal.     Breath sounds: Normal breath sounds.  Abdominal:     General: Bowel sounds are normal.     Palpations: Abdomen is soft.  Musculoskeletal:     Cervical back: Normal range of motion and neck supple.  Skin:    General: Skin is warm and dry.     Capillary Refill: Capillary refill takes less than 2 seconds.  Neurological:     Mental Status: He is alert and oriented to person, place, and time.  Psychiatric:        Behavior: Behavior normal.      Musculoskeletal Exam: C-spine thoracic and lumbar spine with good range of motion.  Shoulder joints, elbow joints, wrist joints, MCPs PIPs and DIPs with good range of motion with no synovitis.  Hip  joints, knee joints, ankles, MTPs and PIPs with good range of motion with no synovitis.  CDAI Exam: CDAI Score: -- Patient Global: --; Provider Global: -- Swollen: --; Tender: -- Joint Exam 04/09/2021   No joint exam has been documented for this visit   There is currently no information documented on the homunculus. Go to the Rheumatology activity and complete the homunculus joint exam.  Investigation: No additional findings.  Imaging: No results found.  Recent Labs: Lab Results  Component Value Date   WBC 4.8 06/02/2020   HGB 10.9 (L) 06/02/2020   PLT 499 (H) 06/02/2020   NA 141 06/02/2020   K 5.1 06/02/2020   CL 108 06/02/2020   CO2 27 06/02/2020   GLUCOSE 92 06/02/2020   BUN 22 06/02/2020   CREATININE 2.00 (H) 06/02/2020   BILITOT 0.5 06/02/2020   ALKPHOS 60 05/17/2020   AST 22 06/02/2020   ALT 20 06/02/2020   PROT 6.6 06/02/2020   ALBUMIN 3.6 05/18/2020   CALCIUM 8.8 06/02/2020   GFRAA 41 (L) 06/02/2020    Speciality Comments: No specialty comments available.  Procedures:  No procedures performed Allergies: Norvasc [  amlodipine besylate]   Assessment / Plan:     Visit Diagnoses: Positive ANA (antinuclear antibody) - Repeat ANA negative, double-stranded DNA 14.  All other autoimmune labs were negative.  He states that for the last few months he has been experiencing some Raynauds symptoms.  I do not notice sclerodactyly, or nailbed capillary changes.  He had good capillary refill.  He denies any history of oral ulcers, nasal ulcers, malar rash, photosensitivity, inflammatory arthritis.  I will contact him once the lab results are available.  I will obtain labs today.  He has been advised to contact me if he develops any new symptoms.  At this point he does not meet the criteria for autoimmune disease.  Raynaud's disease without gangrene-he has been having symptoms of Raynauds for the last few months.  HTN (hypertension), benign-his blood pressure is still  elevated.  Have advised him to monitor his blood pressure closely and follow-up with the his PCP and nephrologist.  Lung nodule - Right middle lobe benign nodule per report on the CT scan.  Calculus of gallbladder without cholecystitis without obstruction  S/P colostomy (HCC) - History of abdominal injury at work.  Gastroesophageal reflux disease without esophagitis  Bilateral ureteral obstruction  Stage 3b chronic kidney disease (HCC) - followed by Dr. Wolfgang Phoenix.   Kidney stones  Idiopathic proctitis  Vitamin D deficiency  Orders: No orders of the defined types were placed in this encounter.  No orders of the defined types were placed in this encounter.    Follow-Up Instructions: Return in about 6 months (around 10/09/2021) for +ANA , Raynauds.   Pollyann Savoy, MD  Note - This record has been created using Animal nutritionist.  Chart creation errors have been sought, but may not always  have been located. Such creation errors do not reflect on  the standard of medical care.

## 2021-04-09 ENCOUNTER — Ambulatory Visit: Payer: 59 | Admitting: Rheumatology

## 2021-04-09 ENCOUNTER — Encounter: Payer: Self-pay | Admitting: Rheumatology

## 2021-04-09 ENCOUNTER — Other Ambulatory Visit: Payer: Self-pay

## 2021-04-09 ENCOUNTER — Other Ambulatory Visit: Payer: Self-pay | Admitting: Family Medicine

## 2021-04-09 VITALS — BP 161/83 | HR 56 | Resp 17 | Ht 69.0 in | Wt 151.6 lb

## 2021-04-09 DIAGNOSIS — N135 Crossing vessel and stricture of ureter without hydronephrosis: Secondary | ICD-10-CM

## 2021-04-09 DIAGNOSIS — I7300 Raynaud's syndrome without gangrene: Secondary | ICD-10-CM

## 2021-04-09 DIAGNOSIS — N1832 Chronic kidney disease, stage 3b: Secondary | ICD-10-CM

## 2021-04-09 DIAGNOSIS — R768 Other specified abnormal immunological findings in serum: Secondary | ICD-10-CM

## 2021-04-09 DIAGNOSIS — K8020 Calculus of gallbladder without cholecystitis without obstruction: Secondary | ICD-10-CM

## 2021-04-09 DIAGNOSIS — R911 Solitary pulmonary nodule: Secondary | ICD-10-CM | POA: Diagnosis not present

## 2021-04-09 DIAGNOSIS — I10 Essential (primary) hypertension: Secondary | ICD-10-CM

## 2021-04-09 DIAGNOSIS — E559 Vitamin D deficiency, unspecified: Secondary | ICD-10-CM

## 2021-04-09 DIAGNOSIS — K6289 Other specified diseases of anus and rectum: Secondary | ICD-10-CM

## 2021-04-09 DIAGNOSIS — K219 Gastro-esophageal reflux disease without esophagitis: Secondary | ICD-10-CM

## 2021-04-09 DIAGNOSIS — Z933 Colostomy status: Secondary | ICD-10-CM

## 2021-04-09 DIAGNOSIS — N200 Calculus of kidney: Secondary | ICD-10-CM

## 2021-04-10 LAB — C3 AND C4
C3 Complement: 90 mg/dL (ref 82–185)
C4 Complement: 27 mg/dL (ref 15–53)

## 2021-04-10 LAB — COMPLETE METABOLIC PANEL WITH GFR
AG Ratio: 1.5 (calc) (ref 1.0–2.5)
ALT: 17 U/L (ref 9–46)
AST: 27 U/L (ref 10–35)
Albumin: 4.3 g/dL (ref 3.6–5.1)
Alkaline phosphatase (APISO): 49 U/L (ref 35–144)
BUN/Creatinine Ratio: 16 (calc) (ref 6–22)
BUN: 38 mg/dL — ABNORMAL HIGH (ref 7–25)
CO2: 28 mmol/L (ref 20–32)
Calcium: 9.4 mg/dL (ref 8.6–10.3)
Chloride: 104 mmol/L (ref 98–110)
Creat: 2.39 mg/dL — ABNORMAL HIGH (ref 0.70–1.25)
GFR, Est African American: 32 mL/min/{1.73_m2} — ABNORMAL LOW (ref >=60)
GFR, Est Non African American: 28 mL/min/{1.73_m2} — ABNORMAL LOW (ref >=60)
Globulin: 2.8 g/dL (calc) (ref 1.9–3.7)
Glucose, Bld: 93 mg/dL (ref 65–99)
Potassium: 4.2 mmol/L (ref 3.5–5.3)
Sodium: 140 mmol/L (ref 135–146)
Total Bilirubin: 0.9 mg/dL (ref 0.2–1.2)
Total Protein: 7.1 g/dL (ref 6.1–8.1)

## 2021-04-10 LAB — CBC WITH DIFFERENTIAL/PLATELET
Absolute Monocytes: 583 cells/uL (ref 200–950)
Basophils Absolute: 49 cells/uL (ref 0–200)
Basophils Relative: 0.9 %
Eosinophils Absolute: 389 cells/uL (ref 15–500)
Eosinophils Relative: 7.2 %
HCT: 33.7 % — ABNORMAL LOW (ref 38.5–50.0)
Hemoglobin: 10.9 g/dL — ABNORMAL LOW (ref 13.2–17.1)
Lymphs Abs: 1323 cells/uL (ref 850–3900)
MCH: 31 pg (ref 27.0–33.0)
MCHC: 32.3 g/dL (ref 32.0–36.0)
MCV: 95.7 fL (ref 80.0–100.0)
MPV: 10.5 fL (ref 7.5–12.5)
Monocytes Relative: 10.8 %
Neutro Abs: 3056 cells/uL (ref 1500–7800)
Neutrophils Relative %: 56.6 %
Platelets: 324 10*3/uL (ref 140–400)
RBC: 3.52 10*6/uL — ABNORMAL LOW (ref 4.20–5.80)
RDW: 11.8 % (ref 11.0–15.0)
Total Lymphocyte: 24.5 %
WBC: 5.4 10*3/uL (ref 3.8–10.8)

## 2021-04-10 LAB — URINALYSIS, ROUTINE W REFLEX MICROSCOPIC
Bacteria, UA: NONE SEEN /HPF
Bilirubin Urine: NEGATIVE
Glucose, UA: NEGATIVE
Hgb urine dipstick: NEGATIVE
Hyaline Cast: NONE SEEN /LPF
Ketones, ur: NEGATIVE
Nitrite: NEGATIVE
Protein, ur: NEGATIVE
RBC / HPF: NONE SEEN /HPF (ref 0–2)
Specific Gravity, Urine: 1.015 (ref 1.001–1.035)
Squamous Epithelial / HPF: NONE SEEN /HPF (ref <=5)
pH: 7 (ref 5.0–8.0)

## 2021-04-10 LAB — ANTI-DNA ANTIBODY, DOUBLE-STRANDED: ds DNA Ab: 13 IU/mL — ABNORMAL HIGH

## 2021-04-10 LAB — SEDIMENTATION RATE: Sed Rate: 6 mm/h (ref 0–20)

## 2021-04-10 LAB — MICROSCOPIC MESSAGE

## 2021-04-10 NOTE — Progress Notes (Signed)
Anemia is a stable.  Kidney functions are low and stable.  Double-stranded DNA is low positive, complements are normal.  Please forward labs to Dr. Wolfgang Phoenix.

## 2021-04-17 ENCOUNTER — Other Ambulatory Visit: Payer: Self-pay

## 2021-04-17 MED ORDER — FENTANYL 25 MCG/HR TD PT72: 1.0000 | MEDICATED_PATCH | TRANSDERMAL | 0 refills | Status: AC

## 2021-04-17 NOTE — Telephone Encounter (Signed)
Patient called to request courtesy refill of fentaNYL (DURAGESIC) 25 MCG/HR [[id number]]    Pharmacy: [facility] - [place], [place] - [address]  [address], [place] [address]  Phone:  [phone] Fax:  [phone]   Please advise patient at [phone].

## 2021-04-17 NOTE — Telephone Encounter (Signed)
Ok to refill??  Last office visit 11/03/2020.  Last refill 03/13/2021.

## 2021-04-17 NOTE — Telephone Encounter (Signed)
Last refill

## 2021-04-21 NOTE — Telephone Encounter (Signed)
Call placed to patient and patient made aware.  

## 2021-04-22 ENCOUNTER — Telehealth: Payer: Self-pay

## 2021-08-14 ENCOUNTER — Other Ambulatory Visit: Payer: Self-pay | Admitting: Family Medicine

## 2021-09-23 NOTE — Progress Notes (Deleted)
Office Visit Note  Patient: Johnathan Baldwin             Date of Birth: Dec 05, 1956           MRN: [id number]             PCP: No primary care provider on file. Referring: No ref. provider found Visit Date: 10/07/2021 Occupation: @GUAROCC @  Subjective:    History of Present Illness: Johnathan Baldwin is a 65 y.o. male with history of positive ANA.   Activities of Daily Living:  Patient reports morning stiffness for *** {minute/hour:19697}.   Patient {ACTIONS;DENIES/REPORTS:21021675::"Denies"} nocturnal pain.  Difficulty dressing/grooming: {ACTIONS;DENIES/REPORTS:21021675::"Denies"} Difficulty climbing stairs: {ACTIONS;DENIES/REPORTS:21021675::"Denies"} Difficulty getting out of chair: {ACTIONS;DENIES/REPORTS:21021675::"Denies"} Difficulty using hands for taps, buttons, cutlery, and/or writing: {ACTIONS;DENIES/REPORTS:21021675::"Denies"}  No Rheumatology ROS completed.   PMFS History:  Patient Active Problem List   Diagnosis Date Noted   CKD (chronic kidney disease) stage 3, GFR 30-59 ml/min (HCC) 06/02/2020   Bilateral ureteral obstruction 05/17/2020   S/P colostomy (HCC) 05/29/2019   GERD (gastroesophageal reflux disease) 05/29/2019   Chronic shoulder pain 12/08/2015   Idiopathic proctitis 07/23/2013   Colon cancer screening 03/23/2013   Lung nodule 03/23/2013   HTN (hypertension), benign 09/02/2011   Kidney stones 09/02/2011   Chronic abdominal pain 09/02/2011    Past Medical History:  Diagnosis Date   Chronic abdominal pain S/P ABD. SURG FROM INJURY   GERD (gastroesophageal reflux disease)    Heart murmur MILD- ASYMPTOMATIC   History of kidney stones    Hypertension    Left ureteral calculus    Lung nodule 03/2012   CT scan  RIGHT MIDDLE LOBE- BENIGN    Family History  Problem Relation Age of Onset   Hypertension Mother    Hypertension Brother    Hypertension Sister    Hypertension Brother    Past Surgical History:  Procedure Laterality Date   ABDOMINAL  SURGERY AND COLOSTOMY  2000   WORK INJURY FROM MACHINE   CYSTOSCOPY W/ URETERAL STENT PLACEMENT Bilateral 05/17/2020   Procedure: CYSTOSCOPY WITH RETROGRADE PYELOGRAM/URETERAL STENT PLACEMENT;  Surgeon: 05/19/2020, MD;  Location: [facility];  Service: Urology;  Laterality: Bilateral;   CYSTOSCOPY WITH RETROGRADE PYELOGRAM, URETEROSCOPY AND STENT PLACEMENT Bilateral 06/25/2020   Procedure: CYSTOSCOPY WITH BILATERAL RETROGRADE PYELOGRAM, BILATERAL URETEROSCOPY AND BILATERAL  STENT REMOVAL; STONE BASKET EXTRACTION LEFT URETERAL CALCULUS;  Surgeon: 08/26/2020, MD;  Location: [facility];  Service: Urology;  Laterality: Bilateral;   URETEROSCOPY  July 2013   W/ stent, s/p removal for left stone   Social History   Social History Narrative   Not on file   Immunization History  Administered Date(s) Administered   Influenza,inj,Quad PF,6+ Mos 11/30/2019, 11/03/2020   Influenza-Unspecified 10/20/2018   Moderna Sars-Covid-2 Vaccination 03/04/2020, 04/15/2020   Tdap 02/07/2012   Zoster Recombinat (Shingrix) 05/11/2017, 08/24/2017     Objective: Vital Signs: There were no vitals taken for this visit.   Physical Exam Vitals and nursing note reviewed.  Constitutional:      Appearance: He is well-developed.  HENT:     Head: Normocephalic and atraumatic.  Eyes:     Conjunctiva/sclera: Conjunctivae normal.     Pupils: Pupils are equal, round, and reactive to light.  Pulmonary:     Effort: Pulmonary effort is normal.  Abdominal:     Palpations: Abdomen is soft.  Musculoskeletal:     Cervical back: Normal range of motion and neck supple.  Skin:    General: Skin is  warm and dry.     Capillary Refill: Capillary refill takes less than 2 seconds.  Neurological:     Mental Status: He is alert and oriented to person, place, and time.  Psychiatric:        Behavior: Behavior normal.     Musculoskeletal Exam: ***  CDAI Exam: CDAI Score: -- Patient Global: --; Provider Global:  -- Swollen: --; Tender: -- Joint Exam 10/07/2021   No joint exam has been documented for this visit   There is currently no information documented on the homunculus. Go to the Rheumatology activity and complete the homunculus joint exam.  Investigation: No additional findings.  Imaging: No results found.  Recent Labs: Lab Results  Component Value Date   WBC 5.4 04/09/2021   HGB 10.9 (L) 04/09/2021   PLT 324 04/09/2021   NA 140 04/09/2021   K 4.2 04/09/2021   CL 104 04/09/2021   CO2 28 04/09/2021   GLUCOSE 93 04/09/2021   BUN 38 (H) 04/09/2021   CREATININE 2.39 (H) 04/09/2021   BILITOT 0.9 04/09/2021   ALKPHOS 60 05/17/2020   AST 27 04/09/2021   ALT 17 04/09/2021   PROT 7.1 04/09/2021   ALBUMIN 3.6 05/18/2020   CALCIUM 9.4 04/09/2021   GFRAA 32 (L) 04/09/2021    Speciality Comments: No specialty comments available.  Procedures:  No procedures performed Allergies: Norvasc [amlodipine besylate]   Assessment / Plan:     Visit Diagnoses: Positive ANA (antinuclear antibody) - Repeat ANA negative, double-stranded DNA 14.  All other autoimmune labs were negative.   Raynaud's disease without gangrene  HTN (hypertension), benign  Calculus of gallbladder without cholecystitis without obstruction  Lung nodule - Right middle lobe benign nodule per report on the CT scan.  S/P colostomy (HCC) - History of abdominal injury at work.  Bilateral ureteral obstruction  Gastroesophageal reflux disease without esophagitis  Kidney stones  Idiopathic proctitis  Stage 3b chronic kidney disease (HCC) - followed by Dr. Wolfgang Phoenix.   Vitamin D deficiency  Orders: No orders of the defined types were placed in this encounter.  No orders of the defined types were placed in this encounter.    Follow-Up Instructions: No follow-ups on file.   Gearldine Bienenstock, PA-C  Note - This record has been created using Dragon software.  Chart creation errors have been sought, but may not  always  have been located. Such creation errors do not reflect on  the standard of medical care.

## 2021-10-07 ENCOUNTER — Ambulatory Visit: Payer: 59 | Admitting: Physician Assistant

## 2021-10-07 DIAGNOSIS — N200 Calculus of kidney: Secondary | ICD-10-CM

## 2021-10-07 DIAGNOSIS — N1832 Chronic kidney disease, stage 3b: Secondary | ICD-10-CM

## 2021-10-07 DIAGNOSIS — N135 Crossing vessel and stricture of ureter without hydronephrosis: Secondary | ICD-10-CM

## 2021-10-07 DIAGNOSIS — K8020 Calculus of gallbladder without cholecystitis without obstruction: Secondary | ICD-10-CM

## 2021-10-07 DIAGNOSIS — R768 Other specified abnormal immunological findings in serum: Secondary | ICD-10-CM

## 2021-10-07 DIAGNOSIS — I10 Essential (primary) hypertension: Secondary | ICD-10-CM

## 2021-10-07 DIAGNOSIS — Z933 Colostomy status: Secondary | ICD-10-CM

## 2021-10-07 DIAGNOSIS — K219 Gastro-esophageal reflux disease without esophagitis: Secondary | ICD-10-CM

## 2021-10-07 DIAGNOSIS — R911 Solitary pulmonary nodule: Secondary | ICD-10-CM

## 2021-10-07 DIAGNOSIS — K6289 Other specified diseases of anus and rectum: Secondary | ICD-10-CM

## 2021-10-07 DIAGNOSIS — I7300 Raynaud's syndrome without gangrene: Secondary | ICD-10-CM

## 2021-10-07 DIAGNOSIS — E559 Vitamin D deficiency, unspecified: Secondary | ICD-10-CM

## 2022-01-28 NOTE — Telephone Encounter (Signed)
Erroneous encounter. Please disregard.

## 2022-09-08 ENCOUNTER — Ambulatory Visit (INDEPENDENT_AMBULATORY_CARE_PROVIDER_SITE_OTHER): Payer: 59 | Admitting: Urology

## 2022-09-08 ENCOUNTER — Encounter: Payer: Self-pay | Admitting: Urology

## 2022-09-08 VITALS — BP 147/68 | HR 59

## 2022-09-08 DIAGNOSIS — T819XXA Unspecified complication of procedure, initial encounter: Secondary | ICD-10-CM

## 2022-09-08 DIAGNOSIS — N4889 Other specified disorders of penis: Secondary | ICD-10-CM | POA: Diagnosis not present

## 2022-09-08 MED ORDER — CLOTRIMAZOLE-BETAMETHASONE 1-0.05 % EX CREA: 1.0000 | TOPICAL_CREAM | Freq: Two times a day (BID) | CUTANEOUS | 1 refills | Status: DC

## 2022-09-08 NOTE — Progress Notes (Signed)
09/08/2022 10:08 AM   Johnathan Baldwin 03/01/1956 [id number]  Referring provider: Monico Blitz, MD [place],  [place] [address]  Penile irritation   HPI: Mr Butler is a 66yo here for evaluation of penile irritation. For the past 6 months he has noted progressive difficulty retraction his foreskin. In the past 2 months he has noted cracking of his foreskin and burning of his glans. He denies any issues urinating. No history of DMII. He has intermittent penile pain with intercourse. No other complaints today   PMH: Past Medical History:  Diagnosis Date   Chronic abdominal pain S/P ABD. SURG FROM INJURY   GERD (gastroesophageal reflux disease)    Heart murmur MILD- ASYMPTOMATIC   History of kidney stones    Hypertension    Left ureteral calculus    Lung nodule 03/2012   CT scan  RIGHT MIDDLE LOBE- BENIGN    Surgical History: Past Surgical History:  Procedure Laterality Date   ABDOMINAL SURGERY AND COLOSTOMY  2000   WORK INJURY FROM MACHINE   CYSTOSCOPY W/ URETERAL STENT PLACEMENT Bilateral 05/17/2020   Procedure: CYSTOSCOPY WITH RETROGRADE PYELOGRAM/URETERAL STENT PLACEMENT;  Surgeon: Cleon Gustin, MD;  Location: [facility];  Service: Urology;  Laterality: Bilateral;   CYSTOSCOPY WITH RETROGRADE PYELOGRAM, URETEROSCOPY AND STENT PLACEMENT Bilateral 06/25/2020   Procedure: CYSTOSCOPY WITH BILATERAL RETROGRADE PYELOGRAM, BILATERAL URETEROSCOPY AND BILATERAL  STENT REMOVAL; STONE BASKET EXTRACTION LEFT URETERAL CALCULUS;  Surgeon: Cleon Gustin, MD;  Location: [facility];  Service: Urology;  Laterality: Bilateral;   URETEROSCOPY  July 2013   W/ stent, s/p removal for left stone    Home Medications:  Allergies as of 09/08/2022       Reactions   Norvasc [amlodipine Besylate] Swelling        Medication List        Accurate as of September 08, 2022 10:08 AM. If you have any questions, ask your nurse or doctor.          carvedilol 12.5 MG tablet Commonly  known as: COREG Take 1 tablet (12.5 mg total) by mouth 2 (two) times daily with a meal.   chlorthalidone 25 MG tablet Commonly known as: HYGROTON Take 1 tablet (25 mg total) by mouth daily.   famotidine 40 MG/5ML suspension Commonly known as: PEPCID TAKE 2.5ML BY MOUTH TWICE DAILY.   fentaNYL 25 MCG/HR Commonly known as: Reagan 1 patch onto the skin every 3 (three) days. No further refills to be given. Patient must establish with new PCP.   hydrALAZINE 100 MG tablet Commonly known as: APRESOLINE Take 1 tablet (100 mg total) by mouth 2 (two) times daily.   metoprolol succinate 25 MG 24 hr tablet Commonly known as: TOPROL-XL TAKE 1 TABLET BY MOUTH ONCE A DAY.   potassium citrate 10 MEQ (1080 MG) SR tablet Commonly known as: UROCIT-K Take 10 mEq by mouth 3 (three) times daily.   Vitamin D (Ergocalciferol) 1.25 MG (50000 UNIT) Caps capsule Commonly known as: DRISDOL Take 1 capsule (50,000 Units total) by mouth once a week.        Allergies:  Allergies  Allergen Reactions   Norvasc [Amlodipine Besylate] Swelling    Family History: Family History  Problem Relation Age of Onset   Hypertension Mother    Hypertension Brother    Hypertension Sister    Hypertension Brother     Social History:  reports that he has never smoked. He has never used smokeless tobacco. He reports that he  does not drink alcohol and does not use drugs.  ROS: All other review of systems were reviewed and are negative except what is noted above in HPI  Physical Exam: BP (!) 147/68   Pulse (!) 59   Constitutional:  Alert and oriented, No acute distress. HEENT: [place] AT, moist mucus membranes.  Trachea midline, no masses. Cardiovascular: No clubbing, cyanosis, or edema. Respiratory: Normal respiratory effort, no increased work of breathing. GI: Abdomen is soft, nontender, nondistended, no abdominal masses GU: No CVA tenderness. Uncircumcised phallus. Balanitis present with tight phimotic  ring. No masses/lesions on penis, testis, scrotum.   Lymph: No cervical or inguinal lymphadenopathy. Skin: No rashes, bruises or suspicious lesions. Neurologic: Grossly intact, no focal deficits, moving all 4 extremities. Psychiatric: Normal mood and affect.  Laboratory Data: Lab Results  Component Value Date   WBC 5.4 04/09/2021   HGB 10.9 (L) 04/09/2021   HCT 33.7 (L) 04/09/2021   MCV 95.7 04/09/2021   PLT 324 04/09/2021    Lab Results  Component Value Date   CREATININE 2.39 (H) 04/09/2021    Lab Results  Component Value Date   PSA 1.1 06/02/2020   PSA 0.5 05/29/2019   PSA 0.4 05/17/2018    No results found for: "TESTOSTERONE"  No results found for: "HGBA1C"  Urinalysis    Component Value Date/Time   COLORURINE YELLOW 04/09/2021 1427   APPEARANCEUR CLEAR 04/09/2021 1427   LABSPEC 1.015 04/09/2021 1427   PHURINE 7.0 04/09/2021 1427   GLUCOSEU NEGATIVE 04/09/2021 1427   HGBUR NEGATIVE 04/09/2021 1427   BILIRUBINUR NEGATIVE 05/17/2020 0003   BILIRUBINUR negative 04/10/2012 1357   KETONESUR NEGATIVE 04/09/2021 1427   PROTEINUR NEGATIVE 04/09/2021 1427   UROBILINOGEN 0.2 04/10/2012 2345   NITRITE NEGATIVE 04/09/2021 1427   LEUKOCYTESUR 2+ (A) 04/09/2021 1427    Lab Results  Component Value Date   BACTERIA NONE SEEN 04/09/2021    Pertinent Imaging:  No results found for this or any previous visit.  No results found for this or any previous visit.  No results found for this or any previous visit.  No results found for this or any previous visit.  Results for orders placed during the hospital encounter of 05/16/20  US RENAL  Narrative CLINICAL DATA:  Acute kidney injury on admission.  EXAM: RENAL / URINARY TRACT ULTRASOUND COMPLETE  COMPARISON:  CT renal stone protocol 05/16/2020 Hg  FINDINGS: Right Kidney:  Renal measurements: 9.8 x 3.6 x 6.0 cm = volume: 172 mL . Echogenicity within normal limits. There are two renal cysts measuring up to  1.8 cm. No hydronephrosis.  Left Kidney:  Renal measurements: 10.4 x 5.0 x 4.1 cm = volume: 112 mL. Echogenicity within normal limits. There is one renal cyst measuring 1.6 cm. There are multiple shadowing renal calculi measuring up to 0.8 cm. No hydronephrosis.  Bladder:  Decompressed with Foley catheter in place.  Other:  None.  IMPRESSION: 1.  Left renal calculi.  No hydronephrosis bilaterally.  2.  Bilateral renal cysts.   Electronically Signed By: Emmaline Kluver M.D. On: 05/17/2020 11:01  No results found for this or any previous visit.  No results found for this or any previous visit.  Results for orders placed during the hospital encounter of 05/16/20  CT Renal Stone Study  Narrative CLINICAL DATA:  Left-sided abdominal pain and vomiting for 2 days. Nephrolithiasis. Acute renal failure.  EXAM: CT ABDOMEN AND PELVIS WITHOUT CONTRAST  TECHNIQUE: Multidetector CT imaging of the abdomen and pelvis was  performed following the standard protocol without IV contrast.  COMPARISON:  01/14/2016 from [facility]  FINDINGS: Lower Chest: No acute findings.  Hepatobiliary: No hepatic masses identified. Gallstones are seen, however there is no evidence of cholecystitis or biliary dilatation.  Pancreas: Stable postop changes from distal pancreatectomy. No evidence of mass or inflammatory changes.  Spleen: Surgically absent.  Adrenals/Urinary Tract: A few small fluid attenuation cyst noted in the right kidney. Mild left renal parenchymal scarring again seen. Multiple calculi are seen in the lower pole of the left kidney measuring up to 7 mm. A 4 mm calculus is seen in the distal left ureter near the UVJ, however there is no evidence of acute hydroureteronephrosis. Mild diffuse bladder wall thickening is seen with hazy opacity in the perivesical fat, suspicious for cystitis.  Stomach/Bowel: No evidence of obstruction, inflammatory process  or abnormal fluid collections. Left abdominal surgical wall defect again seen, as well as partial gastrectomy and right lower quadrant colostomy.  Vascular/Lymphatic: No pathologically enlarged lymph nodes. No abdominal aortic aneurysm. Aortic atherosclerosis noted.  Reproductive:  No mass or other significant abnormality.  Other:  None.  Musculoskeletal:  No suspicious bone lesions identified.  IMPRESSION: 1. 4 mm distal left ureteral calculus near the UVJ, without significant hydroureteronephrosis. 2. Left nephrolithiasis. 3. Mild diffuse bladder wall thickening with hazy opacity in perivesical fat, suspicious for cystitis. Suggest correlation with urinalysis. 4. Cholelithiasis. No radiographic evidence of cholecystitis.  Aortic Atherosclerosis (ICD10-I70.0).   Electronically Signed By: Danae Orleans M.D. On: 05/16/2020 17:51   Assessment & Plan:    1. Phimosis and balanitis -clotrimazole BID for 4 weeks - Urinalysis, Routine w reflex microscopic   No follow-ups on file.  Wilkie Aye, MD  [facility]

## 2022-09-08 NOTE — Patient Instructions (Signed)
Balanitis ? ?Balanitis is swelling and irritation of the head of the penis (glans penis). Balanitis occurs most often among males who have not had their foreskin removed (uncircumcised). In uncircumcised males, the condition may also cause inflammation of the skin around the foreskin. ?Balanitis sometimes causes scarring of the penis or foreskin, which can require surgery. This condition may develop because of an infection or another medical condition. Untreated balanitis can increase the risk of penile cancer. ?What are the causes? ?Common causes of this condition include: ?Irritation and lack of airflow due to fluid (smegma) that can build up on the glans penis. ?Poor personal hygiene, especially in uncircumcised males. Not cleaning the glans penis and foreskin well can result in a buildup of bacteria, viruses, and yeast, which can lead to infection and inflammation. ?Other causes include: ?Chemical irritation from products such as soaps or shower gels, especially those that have fragrance. Chemical irritation can also be caused by condoms, personal lubricants, petroleum jelly, spermicides, fabric softeners, or laundry detergents. ?Skin conditions, such as eczema, dermatitis, and psoriasis. ?Allergies to medicines, such as tetracycline and sulfa drugs. ?What increases the risk? ?The following factors may make you more likely to develop this condition: ?Being an uncircumcised male. ?Having diabetes. ?Having other medical conditions, including liver cirrhosis, congestive heart failure, or kidney disease. ?Having infections, such as candidiasis, HPV (human papillomavirus), herpes simplex, gonorrhea, or syphilis. ?Having a tight foreskin that is difficult to pull back (retract) past the glans penis. ?Being severely obese. ?History of reactive arthritis. ?What are the signs or symptoms? ?Symptoms of this condition include: ?Discharge from under the foreskin, and pain or difficulty retracting the foreskin. ?A bad smell  or itchiness on the penis. ?Tenderness, redness, and swelling of the glans penis. ?A rash or sores on the glans penis or foreskin. ?Inability to get an erection due to pain. ?Trouble urinating. ?Scarring of the penis or foreskin, in some cases. ?How is this diagnosed? ?This condition may be diagnosed based on a physical exam and tests of a swab of discharge to check for bacterial or fungal infection. ?You may also have blood tests to check for: ?Viruses that can cause balanitis. ?A high blood sugar (glucose) level. This could be a sign of diabetes, which can increase the risk of balanitis. ?How is this treated? ?Treatment for this condition depends on the cause. Treatment may include: ?Improving personal hygiene. Your health care provider may recommend sitting in a bath of warm water that is deep enough to cover your hips and buttocks (sitz bath). ?Medicines such as: ?Creams or ointments to reduce swelling (steroids) or to treat an infection. ?Antibiotic medicine. ?Antifungal medicine. ?Having surgery to remove or cut the foreskin (circumcision). This may be done if you have scarring on the foreskin that makes it difficult to retract. ?Controlling other medical problems that may be causing your condition or making it worse. ?Follow these instructions at home: ?Medicines ?Take over-the-counter and prescription medicines only as told by your health care provider. ?If you were prescribed an antibiotic medicine, use it as told by your health care provider. Do not stop using the antibiotic even if you start to feel better. ?General instructions ?Do not have sex until the condition clears up, or until your health care provider approves. ?Keep your penis clean and dry. Take sitz baths as recommended by your health care provider. ?Avoid products that irritate your skin or make symptoms worse, such as soaps and shower gels that have fragrance. ?Keep all follow-up visits. This is   important. ?Contact a health care provider  if: ?Your symptoms get worse or do not improve with home care. ?You develop chills or a fever. ?You have trouble urinating. ?You cannot retract your foreskin. ?Get help right away if: ?You develop severe pain. ?You are unable to urinate. ?Summary ?Balanitis is swelling and irritation of the head of the penis (glans penis). This condition is most common among uncircumcised males. ?Balanitis causes pain, redness, and swelling of the glans penis. ?Good personal hygiene is important. ?Treatment may include improving personal hygiene and applying creams or ointments. ?Contact a health care provider if your symptoms get worse or do not improve with home care. ?This information is not intended to replace advice given to you by your health care provider. Make sure you discuss any questions you have with your health care provider. ?Document Revised: 05/20/2021 Document Reviewed: 05/20/2021 ?Elsevier Patient Education ? 2023 [employer] Inc[employer] ? ?

## 2022-10-12 ENCOUNTER — Ambulatory Visit: Payer: 59 | Admitting: Urology

## 2022-10-12 VITALS — BP 147/82 | HR 60

## 2022-10-12 DIAGNOSIS — N471 Phimosis: Secondary | ICD-10-CM

## 2022-10-12 DIAGNOSIS — N481 Balanitis: Secondary | ICD-10-CM

## 2022-10-12 NOTE — Progress Notes (Signed)
10/12/2022 9:41 AM   Johnathan Baldwin 1956/01/12 [id number]  Referring provider: No referring provider defined for this encounter.  Followup balanitis   HPI: Johnathan Baldwin is a 66yo here for followup for balanitis. He has been applying clotrimazole BID for the past 4 weeks and the irritation and cracking of his foreskin resolved. He denies any issues retracting his foreskin. NO pain with erections. NO significant LUTS.    PMH: Past Medical History:  Diagnosis Date   Chronic abdominal pain S/P ABD. SURG FROM INJURY   GERD (gastroesophageal reflux disease)    Heart murmur MILD- ASYMPTOMATIC   History of kidney stones    Hypertension    Left ureteral calculus    Lung nodule 03/2012   CT scan  RIGHT MIDDLE LOBE- BENIGN    Surgical History: Past Surgical History:  Procedure Laterality Date   ABDOMINAL SURGERY AND COLOSTOMY  2000   WORK INJURY FROM MACHINE   CYSTOSCOPY W/ URETERAL STENT PLACEMENT Bilateral 05/17/2020   Procedure: CYSTOSCOPY WITH RETROGRADE PYELOGRAM/URETERAL STENT PLACEMENT;  Surgeon: Cleon Gustin, MD;  Location: [facility];  Service: Urology;  Laterality: Bilateral;   CYSTOSCOPY WITH RETROGRADE PYELOGRAM, URETEROSCOPY AND STENT PLACEMENT Bilateral 06/25/2020   Procedure: CYSTOSCOPY WITH BILATERAL RETROGRADE PYELOGRAM, BILATERAL URETEROSCOPY AND BILATERAL  STENT REMOVAL; STONE BASKET EXTRACTION LEFT URETERAL CALCULUS;  Surgeon: Cleon Gustin, MD;  Location: [facility];  Service: Urology;  Laterality: Bilateral;   URETEROSCOPY  July 2013   W/ stent, s/p removal for left stone    Home Medications:  Allergies as of 10/12/2022       Reactions   Norvasc [amlodipine Besylate] Swelling        Medication List        Accurate as of October 12, 2022  9:41 AM. If you have any questions, ask your nurse or doctor.          carvedilol 12.5 MG tablet Commonly known as: COREG Take 1 tablet (12.5 mg total) by mouth 2 (two) times daily with a meal.    chlorthalidone 25 MG tablet Commonly known as: HYGROTON Take 1 tablet (25 mg total) by mouth daily.   clotrimazole-betamethasone cream Commonly known as: Lotrisone Apply 1 Application topically 2 (two) times daily.   famotidine 40 MG/5ML suspension Commonly known as: PEPCID TAKE 2.5ML BY MOUTH TWICE DAILY.   fentaNYL 25 MCG/HR Commonly known as: [place] 1 patch onto the skin every 3 (three) days. No further refills to be given. Patient must establish with new PCP.   hydrALAZINE 100 MG tablet Commonly known as: APRESOLINE Take 1 tablet (100 mg total) by mouth 2 (two) times daily.   metoprolol succinate 25 MG 24 hr tablet Commonly known as: TOPROL-XL TAKE 1 TABLET BY MOUTH ONCE A DAY.   potassium citrate 10 MEQ (1080 MG) SR tablet Commonly known as: UROCIT-K Take 10 mEq by mouth 3 (three) times daily.   Vitamin D (Ergocalciferol) 1.25 MG (50000 UNIT) Caps capsule Commonly known as: DRISDOL Take 1 capsule (50,000 Units total) by mouth once a week.        Allergies:  Allergies  Allergen Reactions   Norvasc [Amlodipine Besylate] Swelling    Family History: Family History  Problem Relation Age of Onset   Hypertension Mother    Hypertension Brother    Hypertension Sister    Hypertension Brother     Social History:  reports that he has never smoked. He has never used smokeless tobacco. He reports that he does not  drink alcohol and does not use drugs.  ROS: All other review of systems were reviewed and are negative except what is noted above in HPI  Physical Exam: BP (!) 147/82   Pulse 60   Constitutional:  Alert and oriented, No acute distress. HEENT: [place] AT, moist mucus membranes.  Trachea midline, no masses. Cardiovascular: No clubbing, cyanosis, or edema. Respiratory: Normal respiratory effort, no increased work of breathing. GI: Abdomen is soft, nontender, nondistended, no abdominal masses GU: No CVA tenderness.  Lymph: No cervical or inguinal  lymphadenopathy. Skin: No rashes, bruises or suspicious lesions. Neurologic: Grossly intact, no focal deficits, moving all 4 extremities. Psychiatric: Normal mood and affect.  Laboratory Data: Lab Results  Component Value Date   WBC 5.4 04/09/2021   HGB 10.9 (L) 04/09/2021   HCT 33.7 (L) 04/09/2021   MCV 95.7 04/09/2021   PLT 324 04/09/2021    Lab Results  Component Value Date   CREATININE 2.39 (H) 04/09/2021    Lab Results  Component Value Date   PSA 1.1 06/02/2020   PSA 0.5 05/29/2019   PSA 0.4 05/17/2018    No results found for: "TESTOSTERONE"  No results found for: "HGBA1C"  Urinalysis    Component Value Date/Time   COLORURINE YELLOW 04/09/2021 1427   APPEARANCEUR CLEAR 04/09/2021 1427   LABSPEC 1.015 04/09/2021 1427   PHURINE 7.0 04/09/2021 1427   GLUCOSEU NEGATIVE 04/09/2021 1427   HGBUR NEGATIVE 04/09/2021 1427   BILIRUBINUR NEGATIVE 05/17/2020 0003   BILIRUBINUR negative 04/10/2012 1357   KETONESUR NEGATIVE 04/09/2021 1427   PROTEINUR NEGATIVE 04/09/2021 1427   UROBILINOGEN 0.2 04/10/2012 2345   NITRITE NEGATIVE 04/09/2021 1427   LEUKOCYTESUR 2+ (A) 04/09/2021 1427    Lab Results  Component Value Date   BACTERIA NONE SEEN 04/09/2021    Pertinent Imaging:  No results found for this or any previous visit.  No results found for this or any previous visit.  No results found for this or any previous visit.  No results found for this or any previous visit.  Results for orders placed during the hospital encounter of 05/16/20  US RENAL  Narrative CLINICAL DATA:  Acute kidney injury on admission.  EXAM: RENAL / URINARY TRACT ULTRASOUND COMPLETE  COMPARISON:  CT renal stone protocol 05/16/2020 Hg  FINDINGS: Right Kidney:  Renal measurements: 9.8 x 3.6 x 6.0 cm = volume: 172 mL . Echogenicity within normal limits. There are two renal cysts measuring up to 1.8 cm. No hydronephrosis.  Left Kidney:  Renal measurements: 10.4 x 5.0 x 4.1 cm  = volume: 112 mL. Echogenicity within normal limits. There is one renal cyst measuring 1.6 cm. There are multiple shadowing renal calculi measuring up to 0.8 cm. No hydronephrosis.  Bladder:  Decompressed with Foley catheter in place.  Other:  None.  IMPRESSION: 1.  Left renal calculi.  No hydronephrosis bilaterally.  2.  Bilateral renal cysts.   Electronically Signed By: Emmaline Kluver M.D. On: 05/17/2020 11:01  No valid procedures specified. No results found for this or any previous visit.  Results for orders placed during the hospital encounter of 05/16/20  CT Renal Stone Study  Narrative CLINICAL DATA:  Left-sided abdominal pain and vomiting for 2 days. Nephrolithiasis. Acute renal failure.  EXAM: CT ABDOMEN AND PELVIS WITHOUT CONTRAST  TECHNIQUE: Multidetector CT imaging of the abdomen and pelvis was performed following the standard protocol without IV contrast.  COMPARISON:  01/14/2016 from [facility]  FINDINGS: Lower Chest: No acute findings.  Hepatobiliary: No hepatic masses identified. Gallstones are seen, however there is no evidence of cholecystitis or biliary dilatation.  Pancreas: Stable postop changes from distal pancreatectomy. No evidence of mass or inflammatory changes.  Spleen: Surgically absent.  Adrenals/Urinary Tract: A few small fluid attenuation cyst noted in the right kidney. Mild left renal parenchymal scarring again seen. Multiple calculi are seen in the lower pole of the left kidney measuring up to 7 mm. A 4 mm calculus is seen in the distal left ureter near the UVJ, however there is no evidence of acute hydroureteronephrosis. Mild diffuse bladder wall thickening is seen with hazy opacity in the perivesical fat, suspicious for cystitis.  Stomach/Bowel: No evidence of obstruction, inflammatory process or abnormal fluid collections. Left abdominal surgical wall defect again seen, as well as partial  gastrectomy and right lower quadrant colostomy.  Vascular/Lymphatic: No pathologically enlarged lymph nodes. No abdominal aortic aneurysm. Aortic atherosclerosis noted.  Reproductive:  No mass or other significant abnormality.  Other:  None.  Musculoskeletal:  No suspicious bone lesions identified.  IMPRESSION: 1. 4 mm distal left ureteral calculus near the UVJ, without significant hydroureteronephrosis. 2. Left nephrolithiasis. 3. Mild diffuse bladder wall thickening with hazy opacity in perivesical fat, suspicious for cystitis. Suggest correlation with urinalysis. 4. Cholelithiasis. No radiographic evidence of cholecystitis.  Aortic Atherosclerosis (ICD10-I70.0).   Electronically Signed By: Danae Orleans M.D. On: 05/16/2020 17:51   Assessment & Plan:    1. Phimosis of penis -clotrimazole prn. RTC 1 year - Urinalysis, Routine w reflex microscopic  2. Balanitis -resolved   No follow-ups on file.  Wilkie Aye, MD  [facility] [place]

## 2022-10-12 NOTE — Patient Instructions (Signed)
Balanitis ? ?Balanitis is swelling and irritation of the head of the penis (glans penis). Balanitis occurs most often among males who have not had their foreskin removed (uncircumcised). In uncircumcised males, the condition may also cause inflammation of the skin around the foreskin. ?Balanitis sometimes causes scarring of the penis or foreskin, which can require surgery. This condition may develop because of an infection or another medical condition. Untreated balanitis can increase the risk of penile cancer. ?What are the causes? ?Common causes of this condition include: ?Irritation and lack of airflow due to fluid (smegma) that can build up on the glans penis. ?Poor personal hygiene, especially in uncircumcised males. Not cleaning the glans penis and foreskin well can result in a buildup of bacteria, viruses, and yeast, which can lead to infection and inflammation. ?Other causes include: ?Chemical irritation from products such as soaps or shower gels, especially those that have fragrance. Chemical irritation can also be caused by condoms, personal lubricants, petroleum jelly, spermicides, fabric softeners, or laundry detergents. ?Skin conditions, such as eczema, dermatitis, and psoriasis. ?Allergies to medicines, such as tetracycline and sulfa drugs. ?What increases the risk? ?The following factors may make you more likely to develop this condition: ?Being an uncircumcised male. ?Having diabetes. ?Having other medical conditions, including liver cirrhosis, congestive heart failure, or kidney disease. ?Having infections, such as candidiasis, HPV (human papillomavirus), herpes simplex, gonorrhea, or syphilis. ?Having a tight foreskin that is difficult to pull back (retract) past the glans penis. ?Being severely obese. ?History of reactive arthritis. ?What are the signs or symptoms? ?Symptoms of this condition include: ?Discharge from under the foreskin, and pain or difficulty retracting the foreskin. ?A bad smell  or itchiness on the penis. ?Tenderness, redness, and swelling of the glans penis. ?A rash or sores on the glans penis or foreskin. ?Inability to get an erection due to pain. ?Trouble urinating. ?Scarring of the penis or foreskin, in some cases. ?How is this diagnosed? ?This condition may be diagnosed based on a physical exam and tests of a swab of discharge to check for bacterial or fungal infection. ?You may also have blood tests to check for: ?Viruses that can cause balanitis. ?A high blood sugar (glucose) level. This could be a sign of diabetes, which can increase the risk of balanitis. ?How is this treated? ?Treatment for this condition depends on the cause. Treatment may include: ?Improving personal hygiene. Your health care provider may recommend sitting in a bath of warm water that is deep enough to cover your hips and buttocks (sitz bath). ?Medicines such as: ?Creams or ointments to reduce swelling (steroids) or to treat an infection. ?Antibiotic medicine. ?Antifungal medicine. ?Having surgery to remove or cut the foreskin (circumcision). This may be done if you have scarring on the foreskin that makes it difficult to retract. ?Controlling other medical problems that may be causing your condition or making it worse. ?Follow these instructions at home: ?Medicines ?Take over-the-counter and prescription medicines only as told by your health care provider. ?If you were prescribed an antibiotic medicine, use it as told by your health care provider. Do not stop using the antibiotic even if you start to feel better. ?General instructions ?Do not have sex until the condition clears up, or until your health care provider approves. ?Keep your penis clean and dry. Take sitz baths as recommended by your health care provider. ?Avoid products that irritate your skin or make symptoms worse, such as soaps and shower gels that have fragrance. ?Keep all follow-up visits. This is   important. ?Contact a health care provider  if: ?Your symptoms get worse or do not improve with home care. ?You develop chills or a fever. ?You have trouble urinating. ?You cannot retract your foreskin. ?Get help right away if: ?You develop severe pain. ?You are unable to urinate. ?Summary ?Balanitis is swelling and irritation of the head of the penis (glans penis). This condition is most common among uncircumcised males. ?Balanitis causes pain, redness, and swelling of the glans penis. ?Good personal hygiene is important. ?Treatment may include improving personal hygiene and applying creams or ointments. ?Contact a health care provider if your symptoms get worse or do not improve with home care. ?This information is not intended to replace advice given to you by your health care provider. Make sure you discuss any questions you have with your health care provider. ?Document Revised: 05/20/2021 Document Reviewed: 05/20/2021 ?Elsevier Patient Education ? 2023 [employer] Inc[employer] ? ?

## 2022-10-18 ENCOUNTER — Encounter: Payer: Self-pay | Admitting: Urology

## 2023-10-12 ENCOUNTER — Ambulatory Visit (HOSPITAL_COMMUNITY)
Admission: RE | Admit: 2023-10-12 | Discharge: 2023-10-12 | Disposition: A | Discharge status: Home / Self Care | Payer: 59 | Source: Ambulatory Visit | Attending: Family Medicine | Admitting: Family Medicine

## 2023-10-12 ENCOUNTER — Other Ambulatory Visit (HOSPITAL_COMMUNITY): Payer: Self-pay | Admitting: Family Medicine

## 2023-10-12 DIAGNOSIS — M25431 Effusion, right wrist: Secondary | ICD-10-CM | POA: Diagnosis present

## 2023-10-12 DIAGNOSIS — M25531 Pain in right wrist: Secondary | ICD-10-CM | POA: Insufficient documentation

## 2023-10-14 ENCOUNTER — Ambulatory Visit: Payer: 59 | Admitting: Urology

## 2023-10-14 VITALS — BP 94/52 | HR 59

## 2023-10-14 DIAGNOSIS — N471 Phimosis: Secondary | ICD-10-CM

## 2023-10-14 DIAGNOSIS — N481 Balanitis: Secondary | ICD-10-CM | POA: Diagnosis not present

## 2023-10-14 LAB — URINALYSIS, ROUTINE W REFLEX MICROSCOPIC
Bilirubin, UA: NEGATIVE
Glucose, UA: NEGATIVE
Ketones, UA: NEGATIVE
Nitrite, UA: NEGATIVE
Protein,UA: NEGATIVE
RBC, UA: NEGATIVE
Specific Gravity, UA: 1.015 (ref 1.005–1.030)
Urobilinogen, Ur: 1 mg/dL (ref 0.2–1.0)
pH, UA: 6 (ref 5.0–7.5)

## 2023-10-14 LAB — MICROSCOPIC EXAMINATION
Bacteria, UA: NONE SEEN
WBC, UA: >30 /[HPF] — AB (ref 0–5)

## 2023-10-14 MED ORDER — CLOTRIMAZOLE-BETAMETHASONE 1-0.05 % EX CREA: 1.0000 | TOPICAL_CREAM | CUTANEOUS | 2 refills | Status: DC

## 2023-10-14 NOTE — Progress Notes (Unsigned)
10/14/2023 10:37 AM   Rexene Edison Nov 30, 1956 [id number]  Referring provider: Golden Pop, FNP [address] [place],  [place] [address]  No chief complaint on file.   HPI:    PMH: Past Medical History:  Diagnosis Date   Chronic abdominal pain S/P ABD. SURG FROM INJURY   GERD (gastroesophageal reflux disease)    Heart murmur MILD- ASYMPTOMATIC   History of kidney stones    Hypertension    Left ureteral calculus    Lung nodule 03/2012   CT scan  RIGHT MIDDLE LOBE- BENIGN    Surgical History: Past Surgical History:  Procedure Laterality Date   ABDOMINAL SURGERY AND COLOSTOMY  2000   WORK INJURY FROM MACHINE   CYSTOSCOPY W/ URETERAL STENT PLACEMENT Bilateral 05/17/2020   Procedure: CYSTOSCOPY WITH RETROGRADE PYELOGRAM/URETERAL STENT PLACEMENT;  Surgeon: Malen Gauze, MD;  Location: [facility];  Service: Urology;  Laterality: Bilateral;   CYSTOSCOPY WITH RETROGRADE PYELOGRAM, URETEROSCOPY AND STENT PLACEMENT Bilateral 06/25/2020   Procedure: CYSTOSCOPY WITH BILATERAL RETROGRADE PYELOGRAM, BILATERAL URETEROSCOPY AND BILATERAL  STENT REMOVAL; STONE BASKET EXTRACTION LEFT URETERAL CALCULUS;  Surgeon: Malen Gauze, MD;  Location: [facility];  Service: Urology;  Laterality: Bilateral;   URETEROSCOPY  July 2013   W/ stent, s/p removal for left stone    Home Medications:  Allergies as of 10/14/2023       Reactions   Norvasc [amlodipine Besylate] Swelling        Medication List        Accurate as of October 14, 2023 10:37 AM. If you have any questions, ask your nurse or doctor.          carvedilol 12.5 MG tablet Commonly known as: COREG Take 1 tablet (12.5 mg total) by mouth 2 (two) times daily with a meal.   chlorthalidone 25 MG tablet Commonly known as: HYGROTON Take 1 tablet (25 mg total) by mouth daily.   clotrimazole-betamethasone cream Commonly known as: Lotrisone Apply 1 Application topically 2 (two) times daily.   famotidine 40 MG/5ML  suspension Commonly known as: PEPCID TAKE 2.5ML BY MOUTH TWICE DAILY.   fentaNYL 25 MCG/HR Commonly known as: DURAGESIC Place 1 patch onto the skin every 3 (three) days. No further refills to be given. Patient must establish with new PCP.   hydrALAZINE 100 MG tablet Commonly known as: APRESOLINE Take 1 tablet (100 mg total) by mouth 2 (two) times daily.   metoprolol succinate 25 MG 24 hr tablet Commonly known as: TOPROL-XL TAKE 1 TABLET BY MOUTH ONCE A DAY.   potassium citrate 10 MEQ (1080 MG) SR tablet Commonly known as: UROCIT-K Take 10 mEq by mouth 3 (three) times daily.   Vitamin D (Ergocalciferol) 1.25 MG (50000 UNIT) Caps capsule Commonly known as: DRISDOL Take 1 capsule (50,000 Units total) by mouth once a week.        Allergies:  Allergies  Allergen Reactions   Norvasc [Amlodipine Besylate] Swelling    Family History: Family History  Problem Relation Age of Onset   Hypertension Mother    Hypertension Brother    Hypertension Sister    Hypertension Brother     Social History:  reports that he has never smoked. He has never used smokeless tobacco. He reports that he does not drink alcohol and does not use drugs.  ROS: All other review of systems were reviewed and are negative except what is noted above in HPI  Physical Exam: BP (!) 94/52   Pulse (!) 59  Constitutional:  Alert and oriented, No acute distress. HEENT: [place] AT, moist mucus membranes.  Trachea midline, no masses. Cardiovascular: No clubbing, cyanosis, or edema. Respiratory: Normal respiratory effort, no increased work of breathing. GI: Abdomen is soft, nontender, nondistended, no abdominal masses GU: No CVA tenderness.  Lymph: No cervical or inguinal lymphadenopathy. Skin: No rashes, bruises or suspicious lesions. Neurologic: Grossly intact, no focal deficits, moving all 4 extremities. Psychiatric: Normal mood and affect.  Laboratory Data: Lab Results  Component Value Date   WBC 5.4  04/09/2021   HGB 10.9 (L) 04/09/2021   HCT 33.7 (L) 04/09/2021   MCV 95.7 04/09/2021   PLT 324 04/09/2021    Lab Results  Component Value Date   CREATININE 2.39 (H) 04/09/2021    Lab Results  Component Value Date   PSA 1.1 06/02/2020   PSA 0.5 05/29/2019   PSA 0.4 05/17/2018    No results found for: "TESTOSTERONE"  No results found for: "HGBA1C"  Urinalysis    Component Value Date/Time   COLORURINE YELLOW 04/09/2021 1427   APPEARANCEUR CLEAR 04/09/2021 1427   LABSPEC 1.015 04/09/2021 1427   PHURINE 7.0 04/09/2021 1427   GLUCOSEU NEGATIVE 04/09/2021 1427   HGBUR NEGATIVE 04/09/2021 1427   BILIRUBINUR NEGATIVE 05/17/2020 0003   BILIRUBINUR negative 04/10/2012 1357   KETONESUR NEGATIVE 04/09/2021 1427   PROTEINUR NEGATIVE 04/09/2021 1427   UROBILINOGEN 0.2 04/10/2012 2345   NITRITE NEGATIVE 04/09/2021 1427   LEUKOCYTESUR 2+ (A) 04/09/2021 1427    Lab Results  Component Value Date   BACTERIA NONE SEEN 04/09/2021    Pertinent Imaging: *** No results found for this or any previous visit.  No results found for this or any previous visit.  No results found for this or any previous visit.  No results found for this or any previous visit.  Results for orders placed during the hospital encounter of 05/16/20  US RENAL  Narrative CLINICAL DATA:  Acute kidney injury on admission.  EXAM: RENAL / URINARY TRACT ULTRASOUND COMPLETE  COMPARISON:  CT renal stone protocol 05/16/2020 Hg  FINDINGS: Right Kidney:  Renal measurements: 9.8 x 3.6 x 6.0 cm = volume: 172 mL . Echogenicity within normal limits. There are two renal cysts measuring up to 1.8 cm. No hydronephrosis.  Left Kidney:  Renal measurements: 10.4 x 5.0 x 4.1 cm = volume: 112 mL. Echogenicity within normal limits. There is one renal cyst measuring 1.6 cm. There are multiple shadowing renal calculi measuring up to 0.8 cm. No hydronephrosis.  Bladder:  Decompressed with Foley catheter in  place.  Other:  None.  IMPRESSION: 1.  Left renal calculi.  No hydronephrosis bilaterally.  2.  Bilateral renal cysts.   Electronically Signed By: Emmaline Kluver M.D. On: 05/17/2020 11:01  No valid procedures specified. No results found for this or any previous visit.  Results for orders placed during the hospital encounter of 05/16/20  CT Renal Stone Study  Narrative CLINICAL DATA:  Left-sided abdominal pain and vomiting for 2 days. Nephrolithiasis. Acute renal failure.  EXAM: CT ABDOMEN AND PELVIS WITHOUT CONTRAST  TECHNIQUE: Multidetector CT imaging of the abdomen and pelvis was performed following the standard protocol without IV contrast.  COMPARISON:  01/14/2016 from [facility]  FINDINGS: Lower Chest: No acute findings.  Hepatobiliary: No hepatic masses identified. Gallstones are seen, however there is no evidence of cholecystitis or biliary dilatation.  Pancreas: Stable postop changes from distal pancreatectomy. No evidence of mass or inflammatory changes.  Spleen: Surgically absent.  Adrenals/Urinary Tract: A few small fluid attenuation cyst noted in the right kidney. Mild left renal parenchymal scarring again seen. Multiple calculi are seen in the lower pole of the left kidney measuring up to 7 mm. A 4 mm calculus is seen in the distal left ureter near the UVJ, however there is no evidence of acute hydroureteronephrosis. Mild diffuse bladder wall thickening is seen with hazy opacity in the perivesical fat, suspicious for cystitis.  Stomach/Bowel: No evidence of obstruction, inflammatory process or abnormal fluid collections. Left abdominal surgical wall defect again seen, as well as partial gastrectomy and right lower quadrant colostomy.  Vascular/Lymphatic: No pathologically enlarged lymph nodes. No abdominal aortic aneurysm. Aortic atherosclerosis noted.  Reproductive:  No mass or other significant abnormality.  Other:   None.  Musculoskeletal:  No suspicious bone lesions identified.  IMPRESSION: 1. 4 mm distal left ureteral calculus near the UVJ, without significant hydroureteronephrosis. 2. Left nephrolithiasis. 3. Mild diffuse bladder wall thickening with hazy opacity in perivesical fat, suspicious for cystitis. Suggest correlation with urinalysis. 4. Cholelithiasis. No radiographic evidence of cholecystitis.  Aortic Atherosclerosis (ICD10-I70.0).   Electronically Signed By: Danae Orleans M.D. On: 05/16/2020 17:51   Assessment & Plan:    1.  Balanitis Clotrimazole prn. Followup 1 year   No follow-ups on file.  Wilkie Aye, MD  [facility] [place]

## 2023-10-16 ENCOUNTER — Encounter: Payer: Self-pay | Admitting: Urology

## 2023-10-16 NOTE — Patient Instructions (Signed)
Balanitis  Balanitis is swelling and irritation of the head of the penis (glans penis). Balanitis occurs most often among males who have not had their foreskin removed (uncircumcised). In uncircumcised males, the condition may also cause inflammation of the skin around the foreskin. Balanitis sometimes causes scarring of the penis or foreskin, which can require surgery. This condition may develop because of an infection or another medical condition. Untreated balanitis can increase the risk of penile cancer. What are the causes? Common causes of this condition include: Irritation and lack of airflow due to fluid (smegma) that can build up on the glans penis. Poor personal hygiene, especially in uncircumcised males. Not cleaning the glans penis and foreskin well can result in a buildup of bacteria, viruses, and yeast, which can lead to infection and inflammation. Other causes include: Chemical irritation from products such as soaps or shower gels, especially those that have fragrance. Chemical irritation can also be caused by condoms, personal lubricants, petroleum jelly, spermicides, fabric softeners, or laundry detergents. Skin conditions, such as eczema, dermatitis, and psoriasis. Allergies to medicines, such as tetracycline and sulfa drugs. What increases the risk? The following factors may make you more likely to develop this condition: Being an uncircumcised male. Having diabetes. Having other medical conditions, including liver cirrhosis, congestive heart failure, or kidney disease. Having infections, such as candidiasis, HPV (human papillomavirus), herpes simplex, gonorrhea, or syphilis. Having a tight foreskin that is difficult to pull back (retract) past the glans penis. Being severely obese. History of reactive arthritis. What are the signs or symptoms? Symptoms of this condition include: Discharge from under the foreskin, and pain or difficulty retracting the foreskin. A bad smell  or itchiness on the penis. Tenderness, redness, and swelling of the glans penis. A rash or sores on the glans penis or foreskin. Inability to get an erection due to pain. Trouble urinating. Scarring of the penis or foreskin, in some cases. How is this diagnosed? This condition may be diagnosed based on a physical exam and tests of a swab of discharge to check for bacterial or fungal infection. You may also have blood tests to check for: Viruses that can cause balanitis. A high blood sugar (glucose) level. This could be a sign of diabetes, which can increase the risk of balanitis. How is this treated? Treatment for this condition depends on the cause. Treatment may include: Improving personal hygiene. Your health care provider may recommend sitting in a bath of warm water that is deep enough to cover your hips and buttocks (sitz bath). Medicines such as: Creams or ointments to reduce swelling (steroids) or to treat an infection. Antibiotic medicine. Antifungal medicine. Having surgery to remove or cut the foreskin (circumcision). This may be done if you have scarring on the foreskin that makes it difficult to retract. Controlling other medical problems that may be causing your condition or making it worse. Follow these instructions at home: Medicines Take over-the-counter and prescription medicines only as told by your health care provider. If you were prescribed an antibiotic medicine, use it as told by your health care provider. Do not stop using the antibiotic even if you start to feel better. General instructions Do not have sex until the condition clears up, or until your health care provider approves. Keep your penis clean and dry. Take sitz baths as recommended by your health care provider. Avoid products that irritate your skin or make symptoms worse, such as soaps and shower gels that have fragrance. Keep all follow-up visits. This is  important. Contact a health care provider  if: Your symptoms get worse or do not improve with home care. You develop chills or a fever. You have trouble urinating. You cannot retract your foreskin. Get help right away if: You develop severe pain. You are unable to urinate. Summary Balanitis is swelling and irritation of the head of the penis (glans penis). This condition is most common among uncircumcised males. Balanitis causes pain, redness, and swelling of the glans penis. Good personal hygiene is important. Treatment may include improving personal hygiene and applying creams or ointments. Contact a health care provider if your symptoms get worse or do not improve with home care. This information is not intended to replace advice given to you by your health care provider. Make sure you discuss any questions you have with your health care provider. Document Revised: 05/20/2021 Document Reviewed: 05/20/2021 Elsevier Patient Education  2024 [employer].

## 2024-10-12 ENCOUNTER — Ambulatory Visit: Payer: 59 | Admitting: Urology

## 2024-10-12 VITALS — BP 131/63 | HR 56

## 2024-10-12 DIAGNOSIS — N471 Phimosis: Secondary | ICD-10-CM

## 2024-10-12 DIAGNOSIS — N200 Calculus of kidney: Secondary | ICD-10-CM | POA: Diagnosis not present

## 2024-10-12 DIAGNOSIS — N481 Balanitis: Secondary | ICD-10-CM | POA: Diagnosis not present

## 2024-10-12 MED ORDER — CLOTRIMAZOLE-BETAMETHASONE 1-0.05 % EX CREA: 1.0000 | TOPICAL_CREAM | CUTANEOUS | 5 refills | Status: AC

## 2024-10-12 NOTE — Progress Notes (Signed)
 10/12/2024 11:21 AM   Johnathan Baldwin May 29, 1956 [id number]  Referring provider: Leavy Waddell NOVAK, FNP [address] [place],  [place] [address]  Followup balanitis   HPI: Mr Ayoub is a 35bn here for followup for nephrolithiasis and balanitis. No stone events since last visit. He denies any flank pain. No significant LUTS. He uses clotrimazole  prn for his balanitis which works well   PMH: Past Medical History:  Diagnosis Date   Chronic abdominal pain S/P ABD. SURG FROM INJURY   GERD (gastroesophageal reflux disease)    Heart murmur MILD- ASYMPTOMATIC   History of kidney stones    Hypertension    Left ureteral calculus    Lung nodule 03/2012   CT scan  RIGHT MIDDLE LOBE- BENIGN    Surgical History: Past Surgical History:  Procedure Laterality Date   ABDOMINAL SURGERY AND COLOSTOMY  2000   WORK INJURY FROM MACHINE   CYSTOSCOPY W/ URETERAL STENT PLACEMENT Bilateral 05/17/2020   Procedure: CYSTOSCOPY WITH RETROGRADE PYELOGRAM/URETERAL STENT PLACEMENT;  Surgeon: Sherrilee Belvie CROME, MD;  Location: [facility];  Service: Urology;  Laterality: Bilateral;   CYSTOSCOPY WITH RETROGRADE PYELOGRAM, URETEROSCOPY AND STENT PLACEMENT Bilateral 06/25/2020   Procedure: CYSTOSCOPY WITH BILATERAL RETROGRADE PYELOGRAM, BILATERAL URETEROSCOPY AND BILATERAL  STENT REMOVAL; STONE BASKET EXTRACTION LEFT URETERAL CALCULUS;  Surgeon: Sherrilee Belvie CROME, MD;  Location: [facility];  Service: Urology;  Laterality: Bilateral;   URETEROSCOPY  July 2013   W/ stent, s/p removal for left stone    Home Medications:  Allergies as of 10/12/2024       Reactions   Norvasc  [amlodipine  Besylate] Swelling        Medication List        Accurate as of October 12, 2024 11:21 AM. If you have any questions, ask your nurse or doctor.          carvedilol  12.5 MG tablet Commonly known as: COREG  Take 1 tablet (12.5 mg total) by mouth 2 (two) times daily with a meal.   chlorthalidone  25 MG tablet Commonly known as:  HYGROTON  Take 1 tablet (25 mg total) by mouth daily.   clotrimazole -betamethasone  cream Commonly known as: Lotrisone  Apply 1 Application topically every other day.   famotidine  40 MG/5ML suspension Commonly known as: PEPCID  TAKE 2.5ML BY MOUTH TWICE DAILY.   fentaNYL  25 MCG/HR Commonly known as: DURAGESIC  Place 1 patch onto the skin every 3 (three) days. No further refills to be given. Patient must establish with new PCP.   hydrALAZINE  100 MG tablet Commonly known as: APRESOLINE  Take 1 tablet (100 mg total) by mouth 2 (two) times daily.   metoprolol  succinate 25 MG 24 hr tablet Commonly known as: TOPROL -XL TAKE 1 TABLET BY MOUTH ONCE A DAY.   potassium citrate  10 MEQ (1080 MG) SR tablet Commonly known as: UROCIT-K  Take 10 mEq by mouth 3 (three) times daily.   Vitamin D  (Ergocalciferol ) 1.25 MG (50000 UNIT) Caps capsule Commonly known as: DRISDOL  Take 1 capsule (50,000 Units total) by mouth once a week.        Allergies:  Allergies  Allergen Reactions   Norvasc  [Amlodipine  Besylate] Swelling    Family History: Family History  Problem Relation Age of Onset   Hypertension Mother    Hypertension Brother    Hypertension Sister    Hypertension Brother     Social History:  reports that he has never smoked. He has never used smokeless tobacco. He reports that he does not drink alcohol and does not use drugs.  ROS: All other review of systems were reviewed and are negative except what is noted above in HPI  Physical Exam: BP 131/63   Pulse (!) 56   Constitutional:  Alert and oriented, No acute distress. HEENT: [place] AT, moist mucus membranes.  Trachea midline, no masses. Cardiovascular: No clubbing, cyanosis, or edema. Respiratory: Normal respiratory effort, no increased work of breathing. GI: Abdomen is soft, nontender, nondistended, no abdominal masses GU: No CVA tenderness.  Lymph: No cervical or inguinal lymphadenopathy. Skin: No rashes, bruises or suspicious  lesions. Neurologic: Grossly intact, no focal deficits, moving all 4 extremities. Psychiatric: Normal mood and affect.  Laboratory Data: Lab Results  Component Value Date   WBC 5.4 04/09/2021   HGB 10.9 (L) 04/09/2021   HCT 33.7 (L) 04/09/2021   MCV 95.7 04/09/2021   PLT 324 04/09/2021    Lab Results  Component Value Date   CREATININE 2.39 (H) 04/09/2021    Lab Results  Component Value Date   PSA 1.1 06/02/2020   PSA 0.5 05/29/2019   PSA 0.4 05/17/2018    No results found for: TESTOSTERONE  No results found for: HGBA1C  Urinalysis    Component Value Date/Time   COLORURINE YELLOW 04/09/2021 1427   APPEARANCEUR Cloudy (A) 10/14/2023 1031   LABSPEC 1.015 04/09/2021 1427   PHURINE 7.0 04/09/2021 1427   GLUCOSEU Negative 10/14/2023 1031   HGBUR NEGATIVE 04/09/2021 1427   BILIRUBINUR Negative 10/14/2023 1031   KETONESUR NEGATIVE 04/09/2021 1427   PROTEINUR Negative 10/14/2023 1031   PROTEINUR NEGATIVE 04/09/2021 1427   UROBILINOGEN 0.2 04/10/2012 2345   NITRITE Negative 10/14/2023 1031   NITRITE NEGATIVE 04/09/2021 1427   LEUKOCYTESUR 3+ (A) 10/14/2023 1031   LEUKOCYTESUR 2+ (A) 04/09/2021 1427    Lab Results  Component Value Date   LABMICR See below: 10/14/2023   WBCUA >30 (A) 10/14/2023   LABEPIT 0-10 10/14/2023   BACTERIA None seen 10/14/2023    Pertinent Imaging:  No results found for this or any previous visit.  No results found for this or any previous visit.  No results found for this or any previous visit.  No results found for this or any previous visit.  Results for orders placed during the hospital encounter of 05/16/20  US  RENAL  Narrative CLINICAL DATA:  Acute kidney injury on admission.  EXAM: RENAL / URINARY TRACT ULTRASOUND COMPLETE  COMPARISON:  CT renal stone protocol 05/16/2020 Hg  FINDINGS: Right Kidney:  Renal measurements: 9.8 x 3.6 x 6.0 cm = volume: 172 mL . Echogenicity within normal limits. There are two renal  cysts measuring up to 1.8 cm. No hydronephrosis.  Left Kidney:  Renal measurements: 10.4 x 5.0 x 4.1 cm = volume: 112 mL. Echogenicity within normal limits. There is one renal cyst measuring 1.6 cm. There are multiple shadowing renal calculi measuring up to 0.8 cm. No hydronephrosis.  Bladder:  Decompressed with Foley catheter in place.  Other:  None.  IMPRESSION: 1.  Left renal calculi.  No hydronephrosis bilaterally.  2.  Bilateral renal cysts.   Electronically Signed By: Inocente Ast M.D. On: 05/17/2020 11:01  No results found for this or any previous visit.  No results found for this or any previous visit.  Results for orders placed during the hospital encounter of 05/16/20  CT Renal Stone Study  Narrative CLINICAL DATA:  Left-sided abdominal pain and vomiting for 2 days. Nephrolithiasis. Acute renal failure.  EXAM: CT ABDOMEN AND PELVIS WITHOUT CONTRAST  TECHNIQUE: Multidetector CT imaging of  the abdomen and pelvis was performed following the standard protocol without IV contrast.  COMPARISON:  01/14/2016 from [facility]  FINDINGS: Lower Chest: No acute findings.  Hepatobiliary: No hepatic masses identified. Gallstones are seen, however there is no evidence of cholecystitis or biliary dilatation.  Pancreas: Stable postop changes from distal pancreatectomy. No evidence of mass or inflammatory changes.  Spleen: Surgically absent.  Adrenals/Urinary Tract: A few small fluid attenuation cyst noted in the right kidney. Mild left renal parenchymal scarring again seen. Multiple calculi are seen in the lower pole of the left kidney measuring up to 7 mm. A 4 mm calculus is seen in the distal left ureter near the UVJ, however there is no evidence of acute hydroureteronephrosis. Mild diffuse bladder wall thickening is seen with hazy opacity in the perivesical fat, suspicious for cystitis.  Stomach/Bowel: No evidence of obstruction,  inflammatory process or abnormal fluid collections. Left abdominal surgical wall defect again seen, as well as partial gastrectomy and right lower quadrant colostomy.  Vascular/Lymphatic: No pathologically enlarged lymph nodes. No abdominal aortic aneurysm. Aortic atherosclerosis noted.  Reproductive:  No mass or other significant abnormality.  Other:  None.  Musculoskeletal:  No suspicious bone lesions identified.  IMPRESSION: 1. 4 mm distal left ureteral calculus near the UVJ, without significant hydroureteronephrosis. 2. Left nephrolithiasis. 3. Mild diffuse bladder wall thickening with hazy opacity in perivesical fat, suspicious for cystitis. Suggest correlation with urinalysis. 4. Cholelithiasis. No radiographic evidence of cholecystitis.  Aortic Atherosclerosis (ICD10-I70.0).   Electronically Signed By: Norleen DELENA Kil M.D. On: 05/16/2020 17:51   Assessment & Plan:    1. Phimosis of penis (Primary) Continue clotrimazole  prn  2. Balanitis Clotrimazole  prn  3. Nephrolithiasis -followup 1 year with a K[facility]   No follow-ups on file.  Belvie Clara, MD  [facility] [place]

## 2024-10-16 ENCOUNTER — Encounter: Payer: Self-pay | Admitting: Urology

## 2024-10-16 NOTE — Patient Instructions (Signed)
 Balanitis  Balanitis is swelling and irritation of the head of the penis (glans penis). Balanitis occurs most often among males who have not had their foreskin removed (uncircumcised). In uncircumcised males, the condition may also cause inflammation of the skin around the foreskin. Balanitis sometimes causes scarring of the penis or foreskin, which can require surgery. This condition may develop because of an infection or another medical condition. Untreated balanitis can increase the risk of penile cancer. What are the causes? Common causes of this condition include: Irritation and lack of airflow due to fluid (smegma) that can build up on the glans penis. Poor personal hygiene, especially in uncircumcised males. Not cleaning the glans penis and foreskin well can result in a buildup of bacteria, viruses, and yeast, which can lead to infection and inflammation. Other causes include: Chemical irritation from products such as soaps or shower gels, especially those that have fragrance. Chemical irritation can also be caused by condoms, personal lubricants, petroleum jelly, spermicides, fabric softeners, or laundry detergents. Skin conditions, such as eczema, dermatitis, and psoriasis. Allergies to medicines, such as tetracycline and sulfa drugs. What increases the risk? The following factors may make you more likely to develop this condition: Being an uncircumcised male. Having diabetes. Having other medical conditions, including liver cirrhosis, congestive heart failure, or kidney disease. Having infections, such as candidiasis, HPV (human papillomavirus), herpes simplex, gonorrhea, or syphilis. Having a tight foreskin that is difficult to pull back (retract) past the glans penis. Being severely obese. History of reactive arthritis. What are the signs or symptoms? Symptoms of this condition include: Discharge from under the foreskin, and pain or difficulty retracting the foreskin. A bad smell  or itchiness on the penis. Tenderness, redness, and swelling of the glans penis. A rash or sores on the glans penis or foreskin. Inability to get an erection due to pain. Trouble urinating. Scarring of the penis or foreskin, in some cases. How is this diagnosed? This condition may be diagnosed based on a physical exam and tests of a swab of discharge to check for bacterial or fungal infection. You may also have blood tests to check for: Viruses that can cause balanitis. A high blood sugar (glucose) level. This could be a sign of diabetes, which can increase the risk of balanitis. How is this treated? Treatment for this condition depends on the cause. Treatment may include: Improving personal hygiene. Your health care provider may recommend sitting in a bath of warm water that is deep enough to cover your hips and buttocks (sitz bath). Medicines such as: Creams or ointments to reduce swelling (steroids) or to treat an infection. Antibiotic medicine. Antifungal medicine. Having surgery to remove or cut the foreskin (circumcision). This may be done if you have scarring on the foreskin that makes it difficult to retract. Controlling other medical problems that may be causing your condition or making it worse. Follow these instructions at home: Medicines Take over-the-counter and prescription medicines only as told by your health care provider. If you were prescribed an antibiotic medicine, use it as told by your health care provider. Do not stop using the antibiotic even if you start to feel better. General instructions Do not have sex until the condition clears up, or until your health care provider approves. Keep your penis clean and dry. Take sitz baths as recommended by your health care provider. Avoid products that irritate your skin or make symptoms worse, such as soaps and shower gels that have fragrance. Keep all follow-up visits. This is  important. Contact a health care provider  if: Your symptoms get worse or do not improve with home care. You develop chills or a fever. You have trouble urinating. You cannot retract your foreskin. Get help right away if: You develop severe pain. You are unable to urinate. Summary Balanitis is swelling and irritation of the head of the penis (glans penis). This condition is most common among uncircumcised males. Balanitis causes pain, redness, and swelling of the glans penis. Good personal hygiene is important. Treatment may include improving personal hygiene and applying creams or ointments. Contact a health care provider if your symptoms get worse or do not improve with home care. This information is not intended to replace advice given to you by your health care provider. Make sure you discuss any questions you have with your health care provider. Document Revised: 05/20/2021 Document Reviewed: 05/20/2021 Elsevier Patient Education  2024 [employer].

## 2025-10-11 ENCOUNTER — Ambulatory Visit: Admitting: Urology
